# Patient Record
Sex: Female | Born: 1935
Health system: Southern US, Community
[De-identification: ages and names within clinical notes are randomized; demographics above are authoritative.]

## PROBLEM LIST (undated history)

## (undated) DIAGNOSIS — C801 Malignant (primary) neoplasm, unspecified: Secondary | ICD-10-CM

## (undated) DIAGNOSIS — Z972 Presence of dental prosthetic device (complete) (partial): Secondary | ICD-10-CM

## (undated) DIAGNOSIS — E042 Nontoxic multinodular goiter: Secondary | ICD-10-CM

## (undated) DIAGNOSIS — E041 Nontoxic single thyroid nodule: Secondary | ICD-10-CM

## (undated) DIAGNOSIS — H409 Unspecified glaucoma: Secondary | ICD-10-CM

## (undated) DIAGNOSIS — R319 Hematuria, unspecified: Principal | ICD-10-CM

## (undated) DIAGNOSIS — I10 Essential (primary) hypertension: Secondary | ICD-10-CM

## (undated) DIAGNOSIS — Z923 Personal history of irradiation: Secondary | ICD-10-CM

## (undated) DIAGNOSIS — Z973 Presence of spectacles and contact lenses: Secondary | ICD-10-CM

## (undated) DIAGNOSIS — I700 Atherosclerosis of aorta: Secondary | ICD-10-CM

## (undated) DIAGNOSIS — B269 Mumps without complication: Secondary | ICD-10-CM

## (undated) DIAGNOSIS — Z0000 Encounter for general adult medical examination without abnormal findings: Secondary | ICD-10-CM

## (undated) DIAGNOSIS — R17 Unspecified jaundice: Secondary | ICD-10-CM

## (undated) DIAGNOSIS — E119 Type 2 diabetes mellitus without complications: Secondary | ICD-10-CM

## (undated) DIAGNOSIS — M1990 Unspecified osteoarthritis, unspecified site: Secondary | ICD-10-CM

## (undated) DIAGNOSIS — B059 Measles without complication: Secondary | ICD-10-CM

## (undated) DIAGNOSIS — E785 Hyperlipidemia, unspecified: Secondary | ICD-10-CM

## (undated) HISTORY — DX: Encounter for general adult medical examination without abnormal findings: Z00.00

## (undated) HISTORY — PX: MULTIPLE TOOTH EXTRACTIONS: SHX2053

## (undated) HISTORY — PX: KNEE SURGERY: SHX244

## (undated) HISTORY — PX: TONSILLECTOMY: SHX5217

## (undated) HISTORY — DX: Essential (primary) hypertension: I10

## (undated) HISTORY — DX: Hematuria, unspecified: R31.9

## (undated) HISTORY — DX: Hyperlipidemia, unspecified: E78.5

## (undated) HISTORY — DX: Measles without complication: B05.9

## (undated) HISTORY — DX: Mumps without complication: B26.9

## (undated) HISTORY — PX: EYE SURGERY: SHX253

## (undated) HISTORY — DX: Type 2 diabetes mellitus without complications: E11.9

---
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
## 2005-09-28 LAB — HM COLONOSCOPY: HM Colonoscopy: NORMAL

## 2006-09-28 DIAGNOSIS — E119 Type 2 diabetes mellitus without complications: Secondary | ICD-10-CM

## 2006-09-28 HISTORY — DX: Type 2 diabetes mellitus without complications: E11.9

## 2008-09-28 LAB — HM PAP SMEAR: HM Pap smear: NORMAL

## 2010-09-28 HISTORY — PX: EYE SURGERY: SHX253

## 2013-03-28 LAB — HM DEXA SCAN

## 2013-07-31 ENCOUNTER — Ambulatory Visit (INDEPENDENT_AMBULATORY_CARE_PROVIDER_SITE_OTHER): Payer: Medicare FFS | Admitting: Family Medicine

## 2013-07-31 ENCOUNTER — Encounter: Payer: Self-pay | Admitting: Family Medicine

## 2013-07-31 VITALS — BP 148/68 | HR 78 | Temp 97.7°F | Ht 61.0 in | Wt 141.1 lb

## 2013-07-31 DIAGNOSIS — M1990 Unspecified osteoarthritis, unspecified site: Secondary | ICD-10-CM | POA: Insufficient documentation

## 2013-07-31 DIAGNOSIS — E1122 Type 2 diabetes mellitus with diabetic chronic kidney disease: Secondary | ICD-10-CM | POA: Insufficient documentation

## 2013-07-31 DIAGNOSIS — N1831 Chronic kidney disease, stage 3a: Secondary | ICD-10-CM | POA: Insufficient documentation

## 2013-07-31 DIAGNOSIS — E119 Type 2 diabetes mellitus without complications: Secondary | ICD-10-CM

## 2013-07-31 DIAGNOSIS — E785 Hyperlipidemia, unspecified: Secondary | ICD-10-CM

## 2013-07-31 DIAGNOSIS — Z23 Encounter for immunization: Secondary | ICD-10-CM

## 2013-07-31 DIAGNOSIS — I10 Essential (primary) hypertension: Secondary | ICD-10-CM

## 2013-07-31 DIAGNOSIS — Z0000 Encounter for general adult medical examination without abnormal findings: Secondary | ICD-10-CM

## 2013-07-31 HISTORY — DX: Hyperlipidemia, unspecified: E78.5

## 2013-07-31 MED ORDER — AMLODIPINE BESYLATE 5 MG PO TABS: 5.0000 mg | ORAL_TABLET | Freq: Every day | ORAL | Status: DC

## 2013-07-31 MED ORDER — MICROLET LANCETS MISC: Status: DC

## 2013-07-31 MED ORDER — METFORMIN HCL 500 MG PO TABS: 500.0000 mg | ORAL_TABLET | Freq: Two times a day (BID) | ORAL | Status: DC

## 2013-07-31 MED ORDER — GLUCOSE BLOOD VI STRP: ORAL_STRIP | Status: DC

## 2013-07-31 NOTE — Patient Instructions (Addendum)
DASH Diet  The DASH diet stands for "Dietary Approaches to Stop Hypertension." It is a healthy eating plan that has been shown to reduce high blood pressure (hypertension) in as little as 14 days, while also possibly providing other significant health benefits. These other health benefits include reducing the risk of breast cancer after menopause and reducing the risk of type 2 diabetes, heart disease, colon cancer, and stroke. Health benefits also include weight loss and slowing kidney failure in patients with chronic kidney disease.   DIET GUIDELINES  · Limit salt (sodium). Your diet should contain less than 1500 mg of sodium daily.  · Limit refined or processed carbohydrates. Your diet should include mostly whole grains. Desserts and added sugars should be used sparingly.  · Include small amounts of heart-healthy fats. These types of fats include nuts, oils, and tub margarine. Limit saturated and trans fats. These fats have been shown to be harmful in the body.  CHOOSING FOODS   The following food groups are based on a 2000 calorie diet. See your Registered Dietitian for individual calorie needs.  Grains and Grain Products (6 to 8 servings daily)  · Eat More Often: Whole-wheat bread, brown rice, whole-grain or wheat pasta, quinoa, popcorn without added fat or salt (air popped).  · Eat Less Often: White bread, white pasta, white rice, cornbread.  Vegetables (4 to 5 servings daily)  · Eat More Often: Fresh, frozen, and canned vegetables. Vegetables may be raw, steamed, roasted, or grilled with a minimal amount of fat.  · Eat Less Often/Avoid: Creamed or fried vegetables. Vegetables in a cheese sauce.  Fruit (4 to 5 servings daily)  · Eat More Often: All fresh, canned (in natural juice), or frozen fruits. Dried fruits without added sugar. One hundred percent fruit juice (½ cup [237 mL] daily).  · Eat Less Often: Dried fruits with added sugar. Canned fruit in light or heavy syrup.  Lean Meats, Fish, and Poultry (2  servings or less daily. One serving is 3 to 4 oz [85-114 g]).  · Eat More Often: Ninety percent or leaner ground beef, tenderloin, sirloin. Round cuts of beef, chicken breast, turkey breast. All fish. Grill, bake, or broil your meat. Nothing should be fried.  · Eat Less Often/Avoid: Fatty cuts of meat, turkey, or chicken leg, thigh, or wing. Fried cuts of meat or fish.  Dairy (2 to 3 servings)  · Eat More Often: Low-fat or fat-free milk, low-fat plain or light yogurt, reduced-fat or part-skim cheese.  · Eat Less Often/Avoid: Milk (whole, 2%). Whole milk yogurt. Full-fat cheeses.  Nuts, Seeds, and Legumes (4 to 5 servings per week)  · Eat More Often: All without added salt.  · Eat Less Often/Avoid: Salted nuts and seeds, canned beans with added salt.  Fats and Sweets (limited)  · Eat More Often: Vegetable oils, tub margarines without trans fats, sugar-free gelatin. Mayonnaise and salad dressings.  · Eat Less Often/Avoid: Coconut oils, palm oils, butter, stick margarine, cream, half and half, cookies, candy, pie.  FOR MORE INFORMATION  The Dash Diet Eating Plan: www.dashdiet.org  Document Released: 09/03/2011 Document Revised: 12/07/2011 Document Reviewed: 09/03/2011  ExitCare® Patient Information ©2014 [employer].

## 2013-08-02 ENCOUNTER — Encounter: Payer: Self-pay | Admitting: Family Medicine

## 2013-08-02 DIAGNOSIS — Z0000 Encounter for general adult medical examination without abnormal findings: Secondary | ICD-10-CM

## 2013-08-02 HISTORY — DX: Encounter for general adult medical examination without abnormal findings: Z00.00

## 2013-08-02 NOTE — Progress Notes (Signed)
Patient ID: Kristen Hill, female   DOB: 09/27/1926, 79 y.o.   MRN: [id number] Kristen Hill [id number] 1925-11-01 08/02/2013      Progress Note-Follow Up  Subjective  Chief Complaint  Chief Complaint  Patient presents with  . Establish Care    new patient  . Injections    flu    HPI  Patient is an 12 Caucasian female who is in today to establish care here. No recent illness. Has been following also with her ophthalmologist due to some increased pressure in her eyes and visual changes. So far she has not been given the diagnosis of glaucoma. She denies any fevers or chills. No headaches, chest pain, palpitations, shortness of breath, GI or GU concerns  Past Medical History  Diagnosis Date  . Diabetes mellitus without complication 2008    type 2  . Hypertension   . Mumps as a child  . Measles as a child  . Arthritis     right knee, torn meniscus lateral and medial  . Other and unspecified hyperlipidemia 07/31/2013  . Physical exam, annual 08/02/2013    Sees Dr Sondra Barges at [facility] Dr Carmon Ginsberg, Retinal specialist Dermatolgy    Past Surgical History  Procedure Laterality Date  . Tonsillectomy  79 yrs old  . Eye surgery  2009 and 2010    cataract both eyes  . Eye surgery  2012    laser to left eye, chalazion was removed with laser  . Knee surgery Right     medial meniscus and lateral menicus    Family History  Problem Relation Age of Onset  . Cancer Mother     ovarian  . Diabetes Father   . Kidney disease Father     History   Social History  . Marital Status: Widowed    Spouse Name: N/A    Number of Children: N/A  . Years of Education: N/A   Occupational History  . Not on file.   Social History Main Topics  . Smoking status: Former Smoker -- 0.10 packs/day for 1 years    Types: Cigarettes    Quit date: 09/28/1950  . Smokeless tobacco: Never Used  . Alcohol Use: No  . Drug Use: No  . Sexual Activity: No     Comment: lives with  daughter, no dietary restrictions   Other Topics Concern  . Not on file   Social History Narrative  . No narrative on file    No current outpatient prescriptions on file prior to visit.   No current facility-administered medications on file prior to visit.    Allergies  Allergen Reactions  . Bee Venom   . Sulfa Antibiotics Rash    Review of Systems  Review of Systems  Constitutional: Negative for fever and malaise/fatigue.  HENT: Negative for congestion.   Eyes: Negative for discharge.  Respiratory: Negative for shortness of breath.   Cardiovascular: Negative for chest pain, palpitations and leg swelling.  Gastrointestinal: Negative for nausea, abdominal pain and diarrhea.  Genitourinary: Negative for dysuria.  Musculoskeletal: Negative for falls.  Skin: Positive for rash.  Neurological: Negative for loss of consciousness and headaches.  Endo/Heme/Allergies: Negative for polydipsia.  Psychiatric/Behavioral: Negative for depression and suicidal ideas. The patient is not nervous/anxious and does not have insomnia.     Objective  BP 148/68  Pulse 78  Temp(Src) 97.7 F (36.5 C) (Oral)  Ht 5\' 1"  (1.549 m)  Wt 141 lb 1.3 oz (63.993 kg)  BMI 26.67 kg/m2  SpO2 96%  Physical Exam  Physical Exam  Constitutional: She is oriented to person, place, and time and well-developed, well-nourished, and in no distress. No distress.  HENT:  Head: Normocephalic and atraumatic.  Eyes: Conjunctivae are normal.  Neck: Neck supple. No thyromegaly present.  Cardiovascular: Normal rate, regular rhythm and normal heart sounds.   Pulmonary/Chest: Effort normal and breath sounds normal. She has no wheezes.  Abdominal: She exhibits no distension and no mass.  Musculoskeletal: She exhibits no edema.  Lymphadenopathy:    She has no cervical adenopathy.  Neurological: She is alert and oriented to person, place, and time.  Skin: Skin is warm and dry. No rash noted. She is not diaphoretic.   Psychiatric: Memory, affect and judgment normal.     Assessment & Plan  Hypertension Mild elevation, minimize sodium, patient reports good numbers at home.   Diabetes mellitus without complication Patient reports good control, minimize simple carbs and monitor  Physical exam, annual Flu shot today  Other and unspecified hyperlipidemia Avoid trans fats, check lipid panel and request old records

## 2013-08-02 NOTE — Assessment & Plan Note (Signed)
Avoid trans fats, check lipid panel and request old records

## 2013-08-02 NOTE — Assessment & Plan Note (Signed)
Mild elevation, minimize sodium, patient reports good numbers at home.

## 2013-08-02 NOTE — Assessment & Plan Note (Signed)
Flu shot today 

## 2013-08-02 NOTE — Assessment & Plan Note (Signed)
Patient reports good control, minimize simple carbs and monitor

## 2013-08-28 ENCOUNTER — Ambulatory Visit: Payer: Medicare FFS | Admitting: Family

## 2013-08-28 ENCOUNTER — Telehealth: Payer: Self-pay

## 2013-08-28 DIAGNOSIS — I10 Essential (primary) hypertension: Secondary | ICD-10-CM

## 2013-08-28 DIAGNOSIS — E119 Type 2 diabetes mellitus without complications: Secondary | ICD-10-CM

## 2013-08-28 MED ORDER — MICROLET LANCETS MISC: Status: DC

## 2013-08-28 MED ORDER — AMLODIPINE BESYLATE 5 MG PO TABS: 5.0000 mg | ORAL_TABLET | Freq: Every day | ORAL | Status: DC

## 2013-08-28 MED ORDER — GLUCOSE BLOOD VI STRP: ORAL_STRIP | Status: AC

## 2013-08-28 MED ORDER — METFORMIN HCL 500 MG PO TABS: 500.0000 mg | ORAL_TABLET | Freq: Two times a day (BID) | ORAL | Status: DC

## 2013-08-28 NOTE — Telephone Encounter (Signed)
Pt left a message stating that her medication is not at the right pharmacy?  I called pt back and she states that [facility] on [address] doesn't have her RX's?  Prescriptions were sent to [facility] in [place]?  I informed pt I would resend them to correct pharmacy

## 2013-10-25 ENCOUNTER — Telehealth: Payer: Self-pay | Admitting: *Deleted

## 2013-10-25 DIAGNOSIS — E119 Type 2 diabetes mellitus without complications: Secondary | ICD-10-CM

## 2013-10-25 DIAGNOSIS — E785 Hyperlipidemia, unspecified: Secondary | ICD-10-CM

## 2013-10-25 DIAGNOSIS — I10 Essential (primary) hypertension: Secondary | ICD-10-CM

## 2013-10-25 LAB — RENAL FUNCTION PANEL
Albumin: 4 g/dL (ref 3.5–5.2)
BUN: 19 mg/dL (ref 6–23)
CHLORIDE: 100 meq/L (ref 96–112)
CO2: 25 mEq/L (ref 19–32)
CREATININE: 1.04 mg/dL (ref 0.50–1.10)
Calcium: 9.3 mg/dL (ref 8.4–10.5)
Glucose, Bld: 106 mg/dL — ABNORMAL HIGH (ref 70–99)
PHOSPHORUS: 4.2 mg/dL (ref 2.3–4.6)
Potassium: 4.6 mEq/L (ref 3.5–5.3)
Sodium: 135 mEq/L (ref 135–145)

## 2013-10-25 LAB — HEPATIC FUNCTION PANEL
ALT: 14 U/L (ref 0–35)
AST: 18 U/L (ref 0–37)
Albumin: 4 g/dL (ref 3.5–5.2)
Alkaline Phosphatase: 60 U/L (ref 39–117)
BILIRUBIN DIRECT: 0.1 mg/dL (ref 0.0–0.3)
BILIRUBIN INDIRECT: 0.7 mg/dL (ref 0.2–1.2)
Total Bilirubin: 0.8 mg/dL (ref 0.2–1.2)
Total Protein: 7.4 g/dL (ref 6.0–8.3)

## 2013-10-25 LAB — LIPID PANEL
Cholesterol: 179 mg/dL (ref 0–200)
HDL: 60 mg/dL (ref >39)
LDL CALC: 106 mg/dL — AB (ref 0–99)
TRIGLYCERIDES: 63 mg/dL (ref <150)
Total CHOL/HDL Ratio: 3 Ratio
VLDL: 13 mg/dL (ref 0–40)

## 2013-10-25 LAB — CBC
HEMATOCRIT: 36.2 % (ref 36.0–46.0)
HEMOGLOBIN: 12.1 g/dL (ref 12.0–15.0)
MCH: 30.1 pg (ref 26.0–34.0)
MCHC: 33.4 g/dL (ref 30.0–36.0)
MCV: 90 fL (ref 78.0–100.0)
PLATELETS: 248 10*3/uL (ref 150–400)
RBC: 4.02 MIL/uL (ref 3.87–5.11)
RDW: 13.9 % (ref 11.5–15.5)
WBC: 5.1 10*3/uL (ref 4.0–10.5)

## 2013-10-25 LAB — HEMOGLOBIN A1C
Hgb A1c MFr Bld: 6.1 % — ABNORMAL HIGH (ref <5.7)
Mean Plasma Glucose: 128 mg/dL — ABNORMAL HIGH (ref <117)

## 2013-10-25 LAB — TSH: TSH: 2.022 u[IU]/mL (ref 0.350–4.500)

## 2013-10-25 NOTE — Telephone Encounter (Signed)
Pt presented to the lab. Orders entered per 07/2013 office note as below:  Labs prior to visit lipid, renal, cbc, tsh, hepatic, hgba1c, vitamin d

## 2013-10-26 LAB — VITAMIN D 25 HYDROXY (VIT D DEFICIENCY, FRACTURES): Vit D, 25-Hydroxy: 40 ng/mL (ref 30–89)

## 2013-10-31 ENCOUNTER — Encounter: Payer: Self-pay | Admitting: Family Medicine

## 2013-10-31 ENCOUNTER — Ambulatory Visit (INDEPENDENT_AMBULATORY_CARE_PROVIDER_SITE_OTHER): Payer: Medicare FFS | Admitting: Family Medicine

## 2013-10-31 VITALS — BP 140/72 | HR 84 | Temp 98.0°F | Ht 61.0 in | Wt 146.1 lb

## 2013-10-31 DIAGNOSIS — R319 Hematuria, unspecified: Secondary | ICD-10-CM

## 2013-10-31 HISTORY — DX: Hematuria, unspecified: R31.9

## 2013-10-31 NOTE — Patient Instructions (Signed)
No partially hydrogenated or trans fats, can be found in breads, crackers, peanut butter, creamy, buttery, stuff Minimize  Saturated fats (animal fats) ie butter, meat, cream chees in moderation Minimize white carbs or simple carbs Consider switching from fish oil to krill oil caps only need 1 krill to replace up to 4 fish oil  Cholesterol Cholesterol is a white, waxy, fat-like protein needed by your body in small amounts. The liver makes all the cholesterol you need. It is carried from the liver by the blood through the blood vessels. Deposits (plaque) may build up on blood vessel walls. This makes the arteries narrower and stiffer. Plaque increases the risk for heart attack and stroke. You cannot feel your cholesterol level even if it is very high. The only way to know is by a blood test to check your lipid (fats) levels. Once you know your cholesterol levels, you should keep a record of the test results. Work with your caregiver to to keep your levels in the desired range. WHAT THE RESULTS MEAN:  Total cholesterol is a rough measure of all the cholesterol in your blood.  LDL is the so-called bad cholesterol. This is the type that deposits cholesterol in the walls of the arteries. You want this level to be low.  HDL is the good cholesterol because it cleans the arteries and carries the LDL away. You want this level to be high.  Triglycerides are fat that the body can either burn for energy or store. High levels are closely linked to heart disease. DESIRED LEVELS:  Total cholesterol below 200.  LDL below 100 for people at risk, below 70 for very high risk.  HDL above 50 is good, above 60 is best.  Triglycerides below 150. HOW TO LOWER YOUR CHOLESTEROL:  Diet.  Choose fish or white meat chicken and [place], roasted or baked. Limit fatty cuts of red meat, fried foods, and processed meats, such as sausage and lunch meat.  Eat lots of fresh fruits and vegetables. Choose whole grains,  beans, pasta, potatoes and cereals.  Use only small amounts of olive, corn or canola oils. Avoid butter, mayonnaise, shortening or palm kernel oils. Avoid foods with trans-fats.  Use skim/nonfat milk and low-fat/nonfat yogurt and cheeses. Avoid whole milk, cream, ice cream, egg yolks and cheeses. Healthy desserts include angel food cake, ginger snaps, animal crackers, hard candy, popsicles, and low-fat/nonfat frozen yogurt. Avoid pastries, cakes, pies and cookies.  Exercise.  A regular program helps decrease LDL and raises HDL.  Helps with weight control.  Do things that increase your activity level like gardening, walking, or taking the stairs.  Medication.  May be prescribed by your caregiver to help lowering cholesterol and the risk for heart disease.  You may need medicine even if your levels are normal if you have several risk factors. HOME CARE INSTRUCTIONS   Follow your diet and exercise programs as suggested by your caregiver.  Take medications as directed.  Have blood work done when your caregiver feels it is necessary. MAKE SURE YOU:   Understand these instructions.  Will watch your condition.  Will get help right away if you are not doing well or get worse. Document Released: 06/09/2001 Document Revised: 12/07/2011 Document Reviewed: 06/28/2013 [facility] Patient Information 2014 [place], [place].

## 2013-10-31 NOTE — Progress Notes (Signed)
Pre visit review using our clinic review tool, if applicable. No additional management support is needed unless otherwise documented below in the visit note. 

## 2013-10-31 NOTE — Progress Notes (Signed)
Subjective:     Patient ID: Kristen Hill, female   DOB: [birth year], 80 y.o.   MRN: [id number]   HPI  Pt is here for a 3 mo f/u HTN and Diabetes. She reports blood pressure readings at home most commonly in 962X systolic and high 52W diastolic. Yesterday her BP was 136/78 per home recording. [facility] [employer] [facility] [employer] [facility], walking on the treadmill and the stationary bicycle 3x per week. She checks her blood sugar in the morning before breakfast with common numbers 95-105. She sees an opthalmologist 2x year. She denies burning sensations in her feet and denies falls. She would like her feet examined today for her diabetic exam.   Pt states she would like her ears to be examined and cleaned if appropriate. Gardenia Phlegm, RMA assisted me with cleaning pt's ears after examination.   Review of Systems  Respiratory: Negative for shortness of breath.   Cardiovascular: Negative for chest pain and palpitations.  Neurological: Positive for light-headedness. Negative for numbness and headaches.       Occassional lightheadedness in mornings   Patient Active Problem List   Diagnosis Date Noted  . Physical exam, annual 08/02/2013  . Other and unspecified hyperlipidemia 07/31/2013  . Diabetes mellitus without complication   . Hypertension   . Arthritis    Family History  Problem Relation Age of Onset  . Cancer Mother     ovarian  . Diabetes Father   . Kidney disease Father    History   Social History  . Marital Status: Widowed    Spouse Name: N/A    Number of Children: N/A  . Years of Education: N/A   Social History Main Topics  . Smoking status: Former Smoker -- 0.10 packs/day for 1 years    Types: Cigarettes    Quit date: 09/28/1950  . Smokeless tobacco: Never Used  . Alcohol Use: No  . Drug Use: No  . Sexual Activity: No     Comment: lives with daughter, no dietary restrictions   Other Topics Concern  . None   Social  History Narrative  . None   Current Outpatient Prescriptions on File Prior to Visit  Medication Sig Dispense Refill  . amLODipine (NORVASC) 5 MG tablet Take 1 tablet (5 mg total) by mouth daily.  90 tablet  1  . Calcium-Magnesium-Vitamin D [id number] MG-MG-UNIT TB24 Take by mouth. Calcium 1200 mg plus 1000 IU vitamin D3- 1 daily      . cholecalciferol (VITAMIN D) 1000 UNITS tablet Take 5,000 Units by mouth daily.      . fish oil-omega-3 fatty acids 1000 MG capsule Take 2 g by mouth daily.      Marland Kitchen glucose blood (BAYER CONTOUR TEST) test strip Check blood sugars q am and prn  100 each  5  . metFORMIN (GLUCOPHAGE) 500 MG tablet Take 1 tablet (500 mg total) by mouth 2 (two) times daily with a meal.  180 tablet  1  . MICROLET LANCETS MISC Check BS q am and prn  100 each  5   No current facility-administered medications on file prior to visit.   Allergies  Allergen Reactions  . Bee Venom   . Sulfa Antibiotics Rash      Objective:   Physical Exam  Constitutional: She is oriented to person, place, and time. She appears well-developed and well-nourished. No distress.  HENT:  Head: Normocephalic and atraumatic.  Right Ear: Hearing, tympanic membrane, external ear  and ear canal normal. No tenderness.  Left Ear: Hearing, tympanic membrane, external ear and ear canal normal. No tenderness.  Mouth/Throat: Uvula is midline and oropharynx is clear and moist.  Ears were filled with cerumen bilaterally before cleaning process with Peroxide and warm water.   Eyes: Lids are normal. Right eye exhibits no discharge. Left eye exhibits no discharge.  Wears eyeglasses.   Cardiovascular: Normal rate, regular rhythm and normal heart sounds.   Pulmonary/Chest: Effort normal and breath sounds normal. No respiratory distress.  Lymphadenopathy:       Head (right side): No submental, no submandibular, no tonsillar, no preauricular and no posterior auricular adenopathy present.       Head (left side): No  submental, no submandibular, no tonsillar, no preauricular and no posterior auricular adenopathy present.    She has no cervical adenopathy.  Neurological: She is alert and oriented to person, place, and time. No sensory deficit.  Normal sensation to dorsal and ventral aspects both feet.   Skin: Skin is warm and dry. No rash noted. No erythema.  Psychiatric: She has a normal mood and affect.   Diabetic foot exam performed by Dr. Charlett Blake.   Wt Readings from Last 3 Encounters:  10/31/13 146 lb 1.3 oz (66.261 kg)  07/31/13 141 lb 1.3 oz (63.993 kg)   Temp Readings from Last 3 Encounters:  10/31/13 98 F (36.7 C) Oral  07/31/13 97.7 F (36.5 C) Oral   BP Readings from Last 3 Encounters:  10/31/13 140/72  07/31/13 148/68   Pulse Readings from Last 3 Encounters:  10/31/13 84  07/31/13 78   Her BP today on recheck was 136/72 Left arm.     Assessment:      1. Diabetes, controlled. 2. HTN 3. H/o hematuria 4. Elevated LDL     Plan:      1. Diabetes, continue current medications. Pt was asked to have opthalmology paperwork sent to Dr. Frederik Pear office. F/u in 6 months for Diabetics & HTN management and monitoring.  2. HTN, continue current medication regimen.  3. H/o hematuria, per pt - Pt will have UA with microscopy at next visit. 4. Elevated LDL - Pt educated about statins, fish/krill oil, and trans/saturated fats. She was encouraged to continue current exercise regimen and consider dietary adjustments.     02.03.2015  Martinique Hausladen, PA-S.     Patient seen, inteviewed and examined with student agree with documentation

## 2013-11-01 ENCOUNTER — Encounter: Payer: Self-pay | Admitting: Family Medicine

## 2013-11-15 ENCOUNTER — Encounter: Payer: Self-pay | Admitting: Family Medicine

## 2013-11-15 ENCOUNTER — Telehealth: Payer: Self-pay | Admitting: Family Medicine

## 2013-11-15 NOTE — Telephone Encounter (Signed)
Gave her [employer] paperwork to [place] at her visit.  Is the paperwork ready

## 2013-11-15 NOTE — Telephone Encounter (Signed)
Patient informed that paperwork has to be signed by provider. And then we will fax and mail her a copy.   Patient voiced understanding

## 2013-11-21 ENCOUNTER — Telehealth: Payer: Self-pay

## 2013-11-21 DIAGNOSIS — E559 Vitamin D deficiency, unspecified: Secondary | ICD-10-CM

## 2013-11-21 NOTE — Telephone Encounter (Signed)
We are needing a diagnosis for insurance to pay for Vitamin D testing. We need to know if patient has ever had osteopenia or osteoporsis?  Left a detailed message on patients vm

## 2013-11-22 NOTE — Telephone Encounter (Signed)
OK so resubmit her labs with code for vitamin d deficiency and list vitamin d deficiency in her problem list please

## 2013-11-22 NOTE — Telephone Encounter (Signed)
She states the reason she was taking Vit D was due to lab work

## 2013-11-27 DIAGNOSIS — E559 Vitamin D deficiency, unspecified: Secondary | ICD-10-CM | POA: Insufficient documentation

## 2013-11-27 NOTE — Telephone Encounter (Signed)
Vitamin d deficiency was added to patients problem list

## 2013-11-27 NOTE — Telephone Encounter (Signed)
Patient left a message for me to return her call that she has a couple of questions

## 2013-11-28 NOTE — Telephone Encounter (Signed)
Pt states pneumonia was done in 2009. Date corrected. Pt states that mammogram was done on 7-7.  Pt states that hematuria was listed as a primary diagnosis. Pt would like to know why that is listed when MD has never checked her for this? Pt then proceeded to tell me that for the last 8 or 9 years there is always a trace of blood in her urine and she has been checked out by a lot of people and its nothing? I tried to explain that it was probably listed in the problem list since her and the doctor discussed this? The patient then stated for the last 8 years her primary diagnosis is hypertension.  Please advise?

## 2013-11-28 NOTE — Telephone Encounter (Signed)
Patient wanted to know why we wanted to know if she had osteopenia or osteoporosis.

## 2013-11-29 NOTE — Telephone Encounter (Signed)
So if she has the history she has the history. It is important to note the history is there so in the future everyone will know when someone checks her urine in the the ED at our office or elsewhere. If we do not list it then she will have to explain this all again. I agree her primary diagnosis is HTN. That is certainly the thing we will manage most moving forward.

## 2013-11-29 NOTE — Telephone Encounter (Signed)
Notified pt. She voices understanding.

## 2014-01-17 ENCOUNTER — Telehealth: Payer: Self-pay | Admitting: *Deleted

## 2014-01-17 NOTE — Telephone Encounter (Signed)
Received message from pt requesting return call regarding a "new prescription for a medication".

## 2014-01-18 NOTE — Telephone Encounter (Signed)
Pt was calling wandering why her test strips state to check in the am and pm. I informed patient that it doesn't read in the am and pm it states to check BS in qam and prn. Which is just as needed.  Pt voiced understanding

## 2014-03-13 ENCOUNTER — Other Ambulatory Visit: Payer: Self-pay | Admitting: Family Medicine

## 2014-03-19 ENCOUNTER — Other Ambulatory Visit: Payer: Self-pay | Admitting: Family Medicine

## 2014-03-19 ENCOUNTER — Other Ambulatory Visit: Payer: Self-pay

## 2014-03-19 DIAGNOSIS — D241 Benign neoplasm of right breast: Secondary | ICD-10-CM

## 2014-03-19 DIAGNOSIS — R921 Mammographic calcification found on diagnostic imaging of breast: Secondary | ICD-10-CM

## 2014-04-05 ENCOUNTER — Other Ambulatory Visit: Payer: Medicare FFS

## 2014-04-12 ENCOUNTER — Ambulatory Visit
Admission: RE | Admit: 2014-04-12 | Discharge: 2014-04-12 | Disposition: A | Discharge status: Home / Self Care | Payer: Medicare HMO | Source: Ambulatory Visit | Attending: Family Medicine | Admitting: Family Medicine

## 2014-04-12 ENCOUNTER — Encounter (INDEPENDENT_AMBULATORY_CARE_PROVIDER_SITE_OTHER): Payer: Self-pay

## 2014-04-12 DIAGNOSIS — D241 Benign neoplasm of right breast: Secondary | ICD-10-CM

## 2014-04-12 DIAGNOSIS — R921 Mammographic calcification found on diagnostic imaging of breast: Secondary | ICD-10-CM

## 2014-05-03 ENCOUNTER — Ambulatory Visit: Payer: Medicare FFS | Admitting: Family Medicine

## 2015-01-15 DIAGNOSIS — H40052 Ocular hypertension, left eye: Secondary | ICD-10-CM | POA: Diagnosis not present

## 2015-04-15 ENCOUNTER — Other Ambulatory Visit: Payer: Self-pay

## 2015-04-15 DIAGNOSIS — Z803 Family history of malignant neoplasm of breast: Secondary | ICD-10-CM

## 2015-04-15 DIAGNOSIS — Z1231 Encounter for screening mammogram for malignant neoplasm of breast: Secondary | ICD-10-CM

## 2015-05-03 ENCOUNTER — Ambulatory Visit
Admission: RE | Admit: 2015-05-03 | Discharge: 2015-05-03 | Disposition: A | Discharge status: Home / Self Care | Payer: Medicare PPO | Source: Ambulatory Visit

## 2015-05-03 DIAGNOSIS — Z803 Family history of malignant neoplasm of breast: Secondary | ICD-10-CM

## 2015-05-03 DIAGNOSIS — Z1231 Encounter for screening mammogram for malignant neoplasm of breast: Secondary | ICD-10-CM

## 2015-05-06 ENCOUNTER — Other Ambulatory Visit: Payer: Self-pay | Admitting: Family Medicine

## 2015-05-06 DIAGNOSIS — R928 Other abnormal and inconclusive findings on diagnostic imaging of breast: Secondary | ICD-10-CM

## 2015-05-09 ENCOUNTER — Other Ambulatory Visit: Payer: Self-pay | Admitting: Family Medicine

## 2015-05-09 ENCOUNTER — Ambulatory Visit
Admission: RE | Admit: 2015-05-09 | Discharge: 2015-05-09 | Disposition: A | Discharge status: Home / Self Care | Payer: Medicare PPO | Source: Ambulatory Visit | Attending: Family Medicine | Admitting: Family Medicine

## 2015-05-09 DIAGNOSIS — R921 Mammographic calcification found on diagnostic imaging of breast: Secondary | ICD-10-CM | POA: Diagnosis not present

## 2015-05-09 DIAGNOSIS — R928 Other abnormal and inconclusive findings on diagnostic imaging of breast: Secondary | ICD-10-CM

## 2015-06-10 ENCOUNTER — Encounter (HOSPITAL_BASED_OUTPATIENT_CLINIC_OR_DEPARTMENT_OTHER): Payer: Self-pay | Admitting: Emergency Medicine

## 2015-06-10 ENCOUNTER — Emergency Department (HOSPITAL_BASED_OUTPATIENT_CLINIC_OR_DEPARTMENT_OTHER): Payer: Medicare PPO

## 2015-06-10 ENCOUNTER — Emergency Department (HOSPITAL_BASED_OUTPATIENT_CLINIC_OR_DEPARTMENT_OTHER)
Admission: EM | Admit: 2015-06-10 | Discharge: 2015-06-10 | Disposition: A | Discharge status: Home / Self Care | Payer: Medicare PPO | Attending: Emergency Medicine | Admitting: Emergency Medicine

## 2015-06-10 DIAGNOSIS — Y9301 Activity, walking, marching and hiking: Secondary | ICD-10-CM | POA: Diagnosis not present

## 2015-06-10 DIAGNOSIS — S8991XA Unspecified injury of right lower leg, initial encounter: Secondary | ICD-10-CM | POA: Diagnosis not present

## 2015-06-10 DIAGNOSIS — Z79899 Other long term (current) drug therapy: Secondary | ICD-10-CM | POA: Diagnosis not present

## 2015-06-10 DIAGNOSIS — E119 Type 2 diabetes mellitus without complications: Secondary | ICD-10-CM | POA: Insufficient documentation

## 2015-06-10 DIAGNOSIS — M542 Cervicalgia: Secondary | ICD-10-CM | POA: Diagnosis not present

## 2015-06-10 DIAGNOSIS — S8002XA Contusion of left knee, initial encounter: Secondary | ICD-10-CM | POA: Diagnosis not present

## 2015-06-10 DIAGNOSIS — S0990XA Unspecified injury of head, initial encounter: Secondary | ICD-10-CM | POA: Insufficient documentation

## 2015-06-10 DIAGNOSIS — W01198A Fall on same level from slipping, tripping and stumbling with subsequent striking against other object, initial encounter: Secondary | ICD-10-CM | POA: Insufficient documentation

## 2015-06-10 DIAGNOSIS — S00531A Contusion of lip, initial encounter: Secondary | ICD-10-CM | POA: Diagnosis not present

## 2015-06-10 DIAGNOSIS — S199XXA Unspecified injury of neck, initial encounter: Secondary | ICD-10-CM | POA: Diagnosis not present

## 2015-06-10 DIAGNOSIS — S8992XA Unspecified injury of left lower leg, initial encounter: Secondary | ICD-10-CM | POA: Insufficient documentation

## 2015-06-10 DIAGNOSIS — M25561 Pain in right knee: Secondary | ICD-10-CM | POA: Diagnosis not present

## 2015-06-10 DIAGNOSIS — S01511A Laceration without foreign body of lip, initial encounter: Secondary | ICD-10-CM | POA: Diagnosis not present

## 2015-06-10 DIAGNOSIS — R220 Localized swelling, mass and lump, head: Secondary | ICD-10-CM | POA: Diagnosis not present

## 2015-06-10 DIAGNOSIS — Z8619 Personal history of other infectious and parasitic diseases: Secondary | ICD-10-CM | POA: Diagnosis not present

## 2015-06-10 DIAGNOSIS — S8001XA Contusion of right knee, initial encounter: Secondary | ICD-10-CM | POA: Diagnosis not present

## 2015-06-10 DIAGNOSIS — Z87891 Personal history of nicotine dependence: Secondary | ICD-10-CM | POA: Diagnosis not present

## 2015-06-10 DIAGNOSIS — Y998 Other external cause status: Secondary | ICD-10-CM | POA: Insufficient documentation

## 2015-06-10 DIAGNOSIS — I10 Essential (primary) hypertension: Secondary | ICD-10-CM | POA: Insufficient documentation

## 2015-06-10 DIAGNOSIS — T148 Other injury of unspecified body region: Secondary | ICD-10-CM | POA: Insufficient documentation

## 2015-06-10 DIAGNOSIS — Z8739 Personal history of other diseases of the musculoskeletal system and connective tissue: Secondary | ICD-10-CM | POA: Insufficient documentation

## 2015-06-10 DIAGNOSIS — M25562 Pain in left knee: Secondary | ICD-10-CM | POA: Diagnosis not present

## 2015-06-10 DIAGNOSIS — S0033XA Contusion of nose, initial encounter: Secondary | ICD-10-CM | POA: Diagnosis not present

## 2015-06-10 DIAGNOSIS — Y9289 Other specified places as the place of occurrence of the external cause: Secondary | ICD-10-CM | POA: Diagnosis not present

## 2015-06-10 DIAGNOSIS — T07XXXA Unspecified multiple injuries, initial encounter: Secondary | ICD-10-CM

## 2015-06-10 DIAGNOSIS — S0993XA Unspecified injury of face, initial encounter: Secondary | ICD-10-CM | POA: Diagnosis present

## 2015-06-10 MED ORDER — TRAMADOL HCL 50 MG PO TABS: 50.0000 mg | ORAL_TABLET | Freq: Four times a day (QID) | ORAL | Status: DC | PRN

## 2015-06-10 NOTE — ED Notes (Signed)
MD at bedside. 

## 2015-06-10 NOTE — ED Notes (Signed)
[address] [address] [facility]

## 2015-06-10 NOTE — Discharge Instructions (Signed)
Contusion °A contusion is a deep bruise. Contusions happen when an injury causes bleeding under the skin. Signs of bruising include pain, puffiness (swelling), and discolored skin. The contusion may turn blue, purple, or yellow. °HOME CARE  °· Put ice on the injured area. °¨ Put ice in a plastic bag. °¨ Place a towel between your skin and the bag. °¨ Leave the ice on for 15-20 minutes, 03-04 times a day. °· Only take medicine as told by your doctor. °· Rest the injured area. °· If possible, raise (elevate) the injured area to lessen puffiness. °GET HELP RIGHT AWAY IF:  °· You have more bruising or puffiness. °· You have pain that is getting worse. °· Your puffiness or pain is not helped by medicine. °MAKE SURE YOU:  °· Understand these instructions. °· Will watch your condition. °· Will get help right away if you are not doing well or get worse. °Document Released: 03/02/2008 Document Revised: 12/07/2011 Document Reviewed: 07/20/2011 °ExitCare® Patient Information ©2015 [employer]. This information is not intended to replace advice given to you by your health care provider. Make sure you discuss any questions you have with your health care provider. ° °

## 2015-06-10 NOTE — ED Notes (Addendum)
MD at bedside to remove c-collar

## 2015-06-10 NOTE — ED Notes (Signed)
Family at bedside. Returned from radiology

## 2015-06-10 NOTE — ED Notes (Signed)
Pt in radiology 

## 2015-06-10 NOTE — ED Provider Notes (Signed)
CSN: [id number]     Arrival date & time 06/10/15  1118 History   First MD Initiated Contact with Patient 06/10/15 1124     Chief Complaint  Patient presents with  . Fall      HPI  Patient presents for evaluation after a fall. Patient was walking into the [employer] to do her exercises. She caught her foot on a curb and fell forward. Landed on her knees. Was able to catch herself somewhat with her hands. Her face hit firmly against the concrete. No loss of consciousness. His bleeding from the mouth. Some tenderness over the nose and lips. Complains of pain in the face, head, and neck. Also bilateral knees. Was able to stand and walk with some assistance. Brought in by private conveyance.  Past Medical History  Diagnosis Date  . Diabetes mellitus without complication 4174    type 2  . Hypertension   . Mumps as a child  . Measles as a child  . Arthritis     right knee, torn meniscus lateral and medial  . Other and unspecified hyperlipidemia 07/31/2013  . Physical exam, annual 08/02/2013    Sees Dr Trinna Post at [facility] Dr Farris Has, Retinal specialist Dermatolgy  . Hematuria 10/31/2013   Past Surgical History  Procedure Laterality Date  . Tonsillectomy  81 yrs old  . Eye surgery  2009 and 2010    cataract both eyes  . Eye surgery  2012    laser to left eye, chalazion was removed with laser  . Knee surgery Right     medial meniscus and lateral menicus   Family History  Problem Relation Age of Onset  . Cancer Mother     ovarian  . Diabetes Father   . Kidney disease Father    Social History  Substance Use Topics  . Smoking status: Former Smoker -- 0.10 packs/day for 1 years    Types: Cigarettes    Quit date: 09/28/1950  . Smokeless tobacco: Never Used  . Alcohol Use: No   OB History    No data available     Review of Systems  Constitutional: Negative for fever, chills, diaphoresis, appetite change and fatigue.  HENT: Negative for mouth sores, sore throat and trouble  swallowing.        No dental trauma. Has a "cut" on her upper lip. No blood from the nose or ears. No vision changes.  Eyes: Negative for visual disturbance.  Respiratory: Negative for cough, chest tightness, shortness of breath and wheezing.   Cardiovascular: Negative for chest pain.  Gastrointestinal: Negative for nausea, vomiting, abdominal pain, diarrhea and abdominal distention.  Endocrine: Negative for polydipsia, polyphagia and polyuria.  Genitourinary: Negative for dysuria, frequency and hematuria.  Musculoskeletal: Positive for neck pain. Negative for gait problem.       Knee pain  Skin: Negative for color change, pallor and rash.  Neurological: Negative for dizziness, syncope, light-headedness and headaches.  Hematological: Does not bruise/bleed easily.  Psychiatric/Behavioral: Negative for behavioral problems and confusion.      Allergies  Bee venom and Sulfa antibiotics  Home Medications   Prior to Admission medications   Medication Sig Start Date End Date Taking? Authorizing Provider  amLODipine (NORVASC) 5 MG tablet Take 1 tablet (5 mg total) by mouth daily. 08/28/13   Mosie Lukes, MD  Calcium-Magnesium-Vitamin D [phone] MG-MG-UNIT TB24 Take by mouth. Calcium 1200 mg plus 1000 IU vitamin D3- 1 daily    Historical Provider, MD  cholecalciferol (VITAMIN D)  1000 UNITS tablet Take 5,000 Units by mouth daily.    Historical Provider, MD  glucose blood (BAYER CONTOUR TEST) test strip Check blood sugars q am and prn 08/28/13   Mosie Lukes, MD  metFORMIN (GLUCOPHAGE) 500 MG tablet Take 1 tablet (500 mg total) by mouth 2 (two) times daily with a meal. 08/28/13   Mosie Lukes, MD  MICROLET LANCETS MISC Check BS q am and prn 08/28/13   Mosie Lukes, MD  traMADol (ULTRAM) 50 MG tablet Take 1 tablet (50 mg total) by mouth every 6 (six) hours as needed. 06/10/15   Tanna Furry, MD   BP 134/70 mmHg  Pulse 75  Temp(Src) 97.5 F (36.4 C) (Oral)  Resp 18  Ht 5' (1.524 m)  Wt  140 lb (63.504 kg)  BMI 27.34 kg/m2  SpO2 99% Physical Exam  Constitutional: She is oriented to person, place, and time. She appears well-developed and well-nourished. No distress.  HENT:  Nose:    Mouth/Throat:    No blood over the TMs. No periorbital ecchymosis.  Eyes: Conjunctivae are normal. Pupils are equal, round, and reactive to light. No scleral icterus.  Neck: Normal range of motion. Neck supple. No thyromegaly present.  No midline neck tenderness.  Cardiovascular: Normal rate and regular rhythm.  Exam reveals no gallop and no friction rub.   No murmur heard. Pulmonary/Chest: Effort normal and breath sounds normal. No respiratory distress. She has no wheezes. She has no rales.  Abdominal: Soft. Bowel sounds are normal. She exhibits no distension. There is no tenderness. There is no rebound.  Musculoskeletal: Normal range of motion.  Neurological: She is alert and oriented to person, place, and time.  Skin: Skin is warm and dry. No rash noted.  Psychiatric: She has a normal mood and affect. Her behavior is normal.    ED Course  Procedures (including critical care time) Labs Review Labs Reviewed - No data to display  Imaging Review Ct Head Wo Contrast  06/10/2015   CLINICAL DATA:  Fall and landed on face. Hit nose and chin. Patient in collar.  EXAM: CT HEAD WITHOUT CONTRAST  CT MAXILLOFACIAL WITHOUT CONTRAST  CT CERVICAL SPINE WITHOUT CONTRAST  TECHNIQUE: Multidetector CT imaging of the head, cervical spine, and maxillofacial structures were performed using the standard protocol without intravenous contrast. Multiplanar CT image reconstructions of the cervical spine and maxillofacial structures were also generated.  COMPARISON:  None.  FINDINGS: CT HEAD FINDINGS  There is generalized brain atrophy with commensurate dilatation of the ventricles and sulci. Mild chronic small vessel ischemic changes noted within the deep periventricular white matter regions. There is no mass,  hemorrhage, edema, or other evidence of acute parenchymal abnormality. No extra-axial hemorrhage. No osseous fracture or dislocation seen.  CT MAXILLOFACIAL FINDINGS  There is soft tissue edema/ swelling overlying the anterior mandible. Perhaps mild soft tissue edema about the nose and overlying the maxilla as well.  Osseous structures about the orbits appear intact and well aligned bilaterally. No acute nasal bone fracture. A slight rightward deformity of the nasal bones is likely chronic. Incidental note also made of a leftward nasal septal deviation without associated fracture.  Lower frontal bones appear intact. Bilateral zygoma and pterygoid plates are intact. Walls of the maxillary sinuses and maxilla appear intact and well aligned throughout. No mandible fracture or displacement. Multiple missing teeth within the mandible. No convincing evidence of an acute tooth dislodgement seen.  CT CERVICAL SPINE FINDINGS  There are moderate degenerative changes  throughout the mid and lower cervical spine with associated disc space narrowings and osseous spurring, and associated disc bulges at the C3-4 through C6-7 levels causing mild to moderate central canal stenoses. Slight reversal of the normal cervical lordosis likely related to these underlying degenerative changes. No fracture line or displaced fracture fragment identified. Facet joints appear well aligned.  Atherosclerotic changes noted at each carotid bulb region. Hypodense mass within the left thyroid lobe measures approximately 2.3 x 1.8 cm. Scarring/fibrosis noted at each lung apex.  IMPRESSION: 1. No evidence of acute intracranial abnormality. No intracranial mass, hemorrhage, or edema. Atrophy and chronic small vessel ischemic changes as described above. 2. Soft tissue edema/swelling overlying the anterior mandible and maxilla. No underlying fracture. No acute facial bone fracture or dislocation seen. 3. Degenerative changes within the mid and lower  cervical spine, moderate in degree, as detailed above. 4. No acute fracture or subluxation within the cervical spine. 5. Left thyroid lobe mass measuring 2.3 x 1.8 cm. Recommend nonemergent thyroid ultrasound follow-up to ensure benignity. 6. Atherosclerotic calcifications at each carotid bulb region.   Electronically Signed   By: Franki Cabot M.D.   On: 06/10/2015 12:49   Ct Cervical Spine Wo Contrast  06/10/2015   CLINICAL DATA:  Fall and landed on face. Hit nose and chin. Patient in collar.  EXAM: CT HEAD WITHOUT CONTRAST  CT MAXILLOFACIAL WITHOUT CONTRAST  CT CERVICAL SPINE WITHOUT CONTRAST  TECHNIQUE: Multidetector CT imaging of the head, cervical spine, and maxillofacial structures were performed using the standard protocol without intravenous contrast. Multiplanar CT image reconstructions of the cervical spine and maxillofacial structures were also generated.  COMPARISON:  None.  FINDINGS: CT HEAD FINDINGS  There is generalized brain atrophy with commensurate dilatation of the ventricles and sulci. Mild chronic small vessel ischemic changes noted within the deep periventricular white matter regions. There is no mass, hemorrhage, edema, or other evidence of acute parenchymal abnormality. No extra-axial hemorrhage. No osseous fracture or dislocation seen.  CT MAXILLOFACIAL FINDINGS  There is soft tissue edema/ swelling overlying the anterior mandible. Perhaps mild soft tissue edema about the nose and overlying the maxilla as well.  Osseous structures about the orbits appear intact and well aligned bilaterally. No acute nasal bone fracture. A slight rightward deformity of the nasal bones is likely chronic. Incidental note also made of a leftward nasal septal deviation without associated fracture.  Lower frontal bones appear intact. Bilateral zygoma and pterygoid plates are intact. Walls of the maxillary sinuses and maxilla appear intact and well aligned throughout. No mandible fracture or displacement.  Multiple missing teeth within the mandible. No convincing evidence of an acute tooth dislodgement seen.  CT CERVICAL SPINE FINDINGS  There are moderate degenerative changes throughout the mid and lower cervical spine with associated disc space narrowings and osseous spurring, and associated disc bulges at the C3-4 through C6-7 levels causing mild to moderate central canal stenoses. Slight reversal of the normal cervical lordosis likely related to these underlying degenerative changes. No fracture line or displaced fracture fragment identified. Facet joints appear well aligned.  Atherosclerotic changes noted at each carotid bulb region. Hypodense mass within the left thyroid lobe measures approximately 2.3 x 1.8 cm. Scarring/fibrosis noted at each lung apex.  IMPRESSION: 1. No evidence of acute intracranial abnormality. No intracranial mass, hemorrhage, or edema. Atrophy and chronic small vessel ischemic changes as described above. 2. Soft tissue edema/swelling overlying the anterior mandible and maxilla. No underlying fracture. No acute facial bone fracture or dislocation  seen. 3. Degenerative changes within the mid and lower cervical spine, moderate in degree, as detailed above. 4. No acute fracture or subluxation within the cervical spine. 5. Left thyroid lobe mass measuring 2.3 x 1.8 cm. Recommend nonemergent thyroid ultrasound follow-up to ensure benignity. 6. Atherosclerotic calcifications at each carotid bulb region.   Electronically Signed   By: Franki Cabot M.D.   On: 06/10/2015 12:49   Dg Knee Complete 4 Views Left  06/10/2015   CLINICAL DATA:  Fall on knee yesterday with acute left knee pain. Initial encounter.  EXAM: LEFT KNEE - COMPLETE 4+ VIEW  COMPARISON:  None.  FINDINGS: There is no evidence of fracture, subluxation or dislocation.  There is no evidence of joint effusion.  Degenerative changes are identified, moderate in the patellofemoral compartment.  No focal bony lesions are present.   IMPRESSION: No evidence of acute abnormality  Degenerative changes as described   Electronically Signed   By: Margarette Canada M.D.   On: 06/10/2015 12:58   Dg Knee Complete 4 Views Right  06/10/2015   CLINICAL DATA:  Fall at the [facility]. Bilateral knee pain. Prior knee surgery in 2010.  EXAM: RIGHT KNEE - COMPLETE 4+ VIEW  COMPARISON:  None.  FINDINGS: Moderate articular space narrowing in the medial compartment with considerable tricompartmental spurring. Considerable spurring of the tibial spine. No fracture identified. No definite knee effusion.  IMPRESSION: 1. Osteoarthritis of the knee.  No acute findings.   Electronically Signed   By: Van Clines M.D.   On: 06/10/2015 12:49   Ct Maxillofacial Wo Cm  06/10/2015   CLINICAL DATA:  Fall and landed on face. Hit nose and chin. Patient in collar.  EXAM: CT HEAD WITHOUT CONTRAST  CT MAXILLOFACIAL WITHOUT CONTRAST  CT CERVICAL SPINE WITHOUT CONTRAST  TECHNIQUE: Multidetector CT imaging of the head, cervical spine, and maxillofacial structures were performed using the standard protocol without intravenous contrast. Multiplanar CT image reconstructions of the cervical spine and maxillofacial structures were also generated.  COMPARISON:  None.  FINDINGS: CT HEAD FINDINGS  There is generalized brain atrophy with commensurate dilatation of the ventricles and sulci. Mild chronic small vessel ischemic changes noted within the deep periventricular white matter regions. There is no mass, hemorrhage, edema, or other evidence of acute parenchymal abnormality. No extra-axial hemorrhage. No osseous fracture or dislocation seen.  CT MAXILLOFACIAL FINDINGS  There is soft tissue edema/ swelling overlying the anterior mandible. Perhaps mild soft tissue edema about the nose and overlying the maxilla as well.  Osseous structures about the orbits appear intact and well aligned bilaterally. No acute nasal bone fracture. A slight rightward deformity of the nasal bones is likely  chronic. Incidental note also made of a leftward nasal septal deviation without associated fracture.  Lower frontal bones appear intact. Bilateral zygoma and pterygoid plates are intact. Walls of the maxillary sinuses and maxilla appear intact and well aligned throughout. No mandible fracture or displacement. Multiple missing teeth within the mandible. No convincing evidence of an acute tooth dislodgement seen.  CT CERVICAL SPINE FINDINGS  There are moderate degenerative changes throughout the mid and lower cervical spine with associated disc space narrowings and osseous spurring, and associated disc bulges at the C3-4 through C6-7 levels causing mild to moderate central canal stenoses. Slight reversal of the normal cervical lordosis likely related to these underlying degenerative changes. No fracture line or displaced fracture fragment identified. Facet joints appear well aligned.  Atherosclerotic changes noted at each carotid bulb region. Hypodense mass  within the left thyroid lobe measures approximately 2.3 x 1.8 cm. Scarring/fibrosis noted at each lung apex.  IMPRESSION: 1. No evidence of acute intracranial abnormality. No intracranial mass, hemorrhage, or edema. Atrophy and chronic small vessel ischemic changes as described above. 2. Soft tissue edema/swelling overlying the anterior mandible and maxilla. No underlying fracture. No acute facial bone fracture or dislocation seen. 3. Degenerative changes within the mid and lower cervical spine, moderate in degree, as detailed above. 4. No acute fracture or subluxation within the cervical spine. 5. Left thyroid lobe mass measuring 2.3 x 1.8 cm. Recommend nonemergent thyroid ultrasound follow-up to ensure benignity. 6. Atherosclerotic calcifications at each carotid bulb region.   Electronically Signed   By: Franki Cabot M.D.   On: 06/10/2015 12:49   I have personally reviewed and evaluated these images and lab results as part of my medical decision-making.    EKG Interpretation None      MDM   Final diagnoses:  Multiple contusions    Results discussed with patient. She had been made previously aware of the thyroid nodule. I asked her to follow this up with her primary care doctor. Ice or over-the-counter medicines at home. Return as needed for significant pain. Rinse her mouth after eating to avoid any entrapment of food in the mucosal laceration. This was not repaired.    Tanna Furry, MD 06/10/15 1310

## 2015-07-18 DIAGNOSIS — H40052 Ocular hypertension, left eye: Secondary | ICD-10-CM | POA: Diagnosis not present

## 2015-07-18 DIAGNOSIS — H5203 Hypermetropia, bilateral: Secondary | ICD-10-CM | POA: Diagnosis not present

## 2015-08-16 DIAGNOSIS — E119 Type 2 diabetes mellitus without complications: Secondary | ICD-10-CM | POA: Diagnosis not present

## 2015-08-16 DIAGNOSIS — Z23 Encounter for immunization: Secondary | ICD-10-CM | POA: Diagnosis not present

## 2015-08-16 DIAGNOSIS — H6121 Impacted cerumen, right ear: Secondary | ICD-10-CM | POA: Diagnosis not present

## 2015-08-16 DIAGNOSIS — Z0000 Encounter for general adult medical examination without abnormal findings: Secondary | ICD-10-CM | POA: Diagnosis not present

## 2015-08-16 DIAGNOSIS — E042 Nontoxic multinodular goiter: Secondary | ICD-10-CM | POA: Diagnosis not present

## 2015-09-29 HISTORY — PX: COLONOSCOPY: SHX174

## 2015-10-16 DIAGNOSIS — E042 Nontoxic multinodular goiter: Secondary | ICD-10-CM | POA: Diagnosis not present

## 2016-05-05 ENCOUNTER — Other Ambulatory Visit: Payer: Self-pay | Admitting: Family Medicine

## 2016-05-05 DIAGNOSIS — Z1231 Encounter for screening mammogram for malignant neoplasm of breast: Secondary | ICD-10-CM

## 2016-05-19 ENCOUNTER — Ambulatory Visit
Admission: RE | Admit: 2016-05-19 | Discharge: 2016-05-19 | Disposition: A | Discharge status: Home / Self Care | Payer: Medicare PPO | Source: Ambulatory Visit | Attending: Family Medicine | Admitting: Family Medicine

## 2016-05-19 DIAGNOSIS — Z1231 Encounter for screening mammogram for malignant neoplasm of breast: Secondary | ICD-10-CM | POA: Diagnosis not present

## 2016-07-22 DIAGNOSIS — H524 Presbyopia: Secondary | ICD-10-CM | POA: Diagnosis not present

## 2016-07-22 DIAGNOSIS — H43812 Vitreous degeneration, left eye: Secondary | ICD-10-CM | POA: Diagnosis not present

## 2016-07-22 DIAGNOSIS — E119 Type 2 diabetes mellitus without complications: Secondary | ICD-10-CM | POA: Diagnosis not present

## 2016-07-22 DIAGNOSIS — H40052 Ocular hypertension, left eye: Secondary | ICD-10-CM | POA: Diagnosis not present

## 2016-07-22 DIAGNOSIS — H16223 Keratoconjunctivitis sicca, not specified as Sjogren's, bilateral: Secondary | ICD-10-CM | POA: Diagnosis not present

## 2016-07-22 DIAGNOSIS — Z961 Presence of intraocular lens: Secondary | ICD-10-CM | POA: Diagnosis not present

## 2016-08-25 DIAGNOSIS — M9903 Segmental and somatic dysfunction of lumbar region: Secondary | ICD-10-CM | POA: Diagnosis not present

## 2016-08-25 DIAGNOSIS — M9902 Segmental and somatic dysfunction of thoracic region: Secondary | ICD-10-CM | POA: Diagnosis not present

## 2016-08-25 DIAGNOSIS — M9901 Segmental and somatic dysfunction of cervical region: Secondary | ICD-10-CM | POA: Diagnosis not present

## 2016-08-25 DIAGNOSIS — G542 Cervical root disorders, not elsewhere classified: Secondary | ICD-10-CM | POA: Diagnosis not present

## 2016-08-26 DIAGNOSIS — G542 Cervical root disorders, not elsewhere classified: Secondary | ICD-10-CM | POA: Diagnosis not present

## 2016-08-26 DIAGNOSIS — M9903 Segmental and somatic dysfunction of lumbar region: Secondary | ICD-10-CM | POA: Diagnosis not present

## 2016-08-26 DIAGNOSIS — M9901 Segmental and somatic dysfunction of cervical region: Secondary | ICD-10-CM | POA: Diagnosis not present

## 2016-08-26 DIAGNOSIS — M9902 Segmental and somatic dysfunction of thoracic region: Secondary | ICD-10-CM | POA: Diagnosis not present

## 2016-08-31 DIAGNOSIS — G544 Lumbosacral root disorders, not elsewhere classified: Secondary | ICD-10-CM | POA: Diagnosis not present

## 2016-08-31 DIAGNOSIS — M9904 Segmental and somatic dysfunction of sacral region: Secondary | ICD-10-CM | POA: Diagnosis not present

## 2016-08-31 DIAGNOSIS — M9905 Segmental and somatic dysfunction of pelvic region: Secondary | ICD-10-CM | POA: Diagnosis not present

## 2016-08-31 DIAGNOSIS — M9903 Segmental and somatic dysfunction of lumbar region: Secondary | ICD-10-CM | POA: Diagnosis not present

## 2016-09-03 DIAGNOSIS — G544 Lumbosacral root disorders, not elsewhere classified: Secondary | ICD-10-CM | POA: Diagnosis not present

## 2016-09-03 DIAGNOSIS — M9903 Segmental and somatic dysfunction of lumbar region: Secondary | ICD-10-CM | POA: Diagnosis not present

## 2016-09-03 DIAGNOSIS — M9905 Segmental and somatic dysfunction of pelvic region: Secondary | ICD-10-CM | POA: Diagnosis not present

## 2016-09-03 DIAGNOSIS — M9904 Segmental and somatic dysfunction of sacral region: Secondary | ICD-10-CM | POA: Diagnosis not present

## 2016-09-07 DIAGNOSIS — G544 Lumbosacral root disorders, not elsewhere classified: Secondary | ICD-10-CM | POA: Diagnosis not present

## 2016-09-07 DIAGNOSIS — M9904 Segmental and somatic dysfunction of sacral region: Secondary | ICD-10-CM | POA: Diagnosis not present

## 2016-09-07 DIAGNOSIS — M9905 Segmental and somatic dysfunction of pelvic region: Secondary | ICD-10-CM | POA: Diagnosis not present

## 2016-09-07 DIAGNOSIS — M9903 Segmental and somatic dysfunction of lumbar region: Secondary | ICD-10-CM | POA: Diagnosis not present

## 2016-09-09 DIAGNOSIS — G544 Lumbosacral root disorders, not elsewhere classified: Secondary | ICD-10-CM | POA: Diagnosis not present

## 2016-09-09 DIAGNOSIS — M9903 Segmental and somatic dysfunction of lumbar region: Secondary | ICD-10-CM | POA: Diagnosis not present

## 2016-09-09 DIAGNOSIS — M9904 Segmental and somatic dysfunction of sacral region: Secondary | ICD-10-CM | POA: Diagnosis not present

## 2016-09-10 DIAGNOSIS — I10 Essential (primary) hypertension: Secondary | ICD-10-CM | POA: Diagnosis not present

## 2016-09-10 DIAGNOSIS — E782 Mixed hyperlipidemia: Secondary | ICD-10-CM | POA: Diagnosis not present

## 2016-09-10 DIAGNOSIS — L639 Alopecia areata, unspecified: Secondary | ICD-10-CM | POA: Diagnosis not present

## 2016-09-10 DIAGNOSIS — E119 Type 2 diabetes mellitus without complications: Secondary | ICD-10-CM | POA: Diagnosis not present

## 2016-09-10 DIAGNOSIS — E559 Vitamin D deficiency, unspecified: Secondary | ICD-10-CM | POA: Diagnosis not present

## 2016-09-11 DIAGNOSIS — M9904 Segmental and somatic dysfunction of sacral region: Secondary | ICD-10-CM | POA: Diagnosis not present

## 2016-09-11 DIAGNOSIS — M9903 Segmental and somatic dysfunction of lumbar region: Secondary | ICD-10-CM | POA: Diagnosis not present

## 2016-09-11 DIAGNOSIS — M9905 Segmental and somatic dysfunction of pelvic region: Secondary | ICD-10-CM | POA: Diagnosis not present

## 2016-09-11 DIAGNOSIS — G544 Lumbosacral root disorders, not elsewhere classified: Secondary | ICD-10-CM | POA: Diagnosis not present

## 2016-09-15 DIAGNOSIS — M9905 Segmental and somatic dysfunction of pelvic region: Secondary | ICD-10-CM | POA: Diagnosis not present

## 2016-09-15 DIAGNOSIS — M9904 Segmental and somatic dysfunction of sacral region: Secondary | ICD-10-CM | POA: Diagnosis not present

## 2016-09-15 DIAGNOSIS — M9903 Segmental and somatic dysfunction of lumbar region: Secondary | ICD-10-CM | POA: Diagnosis not present

## 2016-09-15 DIAGNOSIS — G544 Lumbosacral root disorders, not elsewhere classified: Secondary | ICD-10-CM | POA: Diagnosis not present

## 2016-09-16 DIAGNOSIS — M9904 Segmental and somatic dysfunction of sacral region: Secondary | ICD-10-CM | POA: Diagnosis not present

## 2016-09-16 DIAGNOSIS — M9905 Segmental and somatic dysfunction of pelvic region: Secondary | ICD-10-CM | POA: Diagnosis not present

## 2016-09-16 DIAGNOSIS — G544 Lumbosacral root disorders, not elsewhere classified: Secondary | ICD-10-CM | POA: Diagnosis not present

## 2016-09-16 DIAGNOSIS — M9903 Segmental and somatic dysfunction of lumbar region: Secondary | ICD-10-CM | POA: Diagnosis not present

## 2016-09-18 DIAGNOSIS — M9903 Segmental and somatic dysfunction of lumbar region: Secondary | ICD-10-CM | POA: Diagnosis not present

## 2016-09-18 DIAGNOSIS — M9904 Segmental and somatic dysfunction of sacral region: Secondary | ICD-10-CM | POA: Diagnosis not present

## 2016-09-18 DIAGNOSIS — G544 Lumbosacral root disorders, not elsewhere classified: Secondary | ICD-10-CM | POA: Diagnosis not present

## 2016-09-18 DIAGNOSIS — M9905 Segmental and somatic dysfunction of pelvic region: Secondary | ICD-10-CM | POA: Diagnosis not present

## 2016-10-08 DIAGNOSIS — M9905 Segmental and somatic dysfunction of pelvic region: Secondary | ICD-10-CM | POA: Diagnosis not present

## 2016-10-08 DIAGNOSIS — M9903 Segmental and somatic dysfunction of lumbar region: Secondary | ICD-10-CM | POA: Diagnosis not present

## 2016-10-08 DIAGNOSIS — M9904 Segmental and somatic dysfunction of sacral region: Secondary | ICD-10-CM | POA: Diagnosis not present

## 2016-10-08 DIAGNOSIS — G544 Lumbosacral root disorders, not elsewhere classified: Secondary | ICD-10-CM | POA: Diagnosis not present

## 2016-11-05 DIAGNOSIS — M9904 Segmental and somatic dysfunction of sacral region: Secondary | ICD-10-CM | POA: Diagnosis not present

## 2016-11-05 DIAGNOSIS — G544 Lumbosacral root disorders, not elsewhere classified: Secondary | ICD-10-CM | POA: Diagnosis not present

## 2016-11-05 DIAGNOSIS — M9903 Segmental and somatic dysfunction of lumbar region: Secondary | ICD-10-CM | POA: Diagnosis not present

## 2016-11-05 DIAGNOSIS — M9905 Segmental and somatic dysfunction of pelvic region: Secondary | ICD-10-CM | POA: Diagnosis not present

## 2016-12-01 DIAGNOSIS — M9905 Segmental and somatic dysfunction of pelvic region: Secondary | ICD-10-CM | POA: Diagnosis not present

## 2016-12-01 DIAGNOSIS — M9903 Segmental and somatic dysfunction of lumbar region: Secondary | ICD-10-CM | POA: Diagnosis not present

## 2016-12-01 DIAGNOSIS — M9904 Segmental and somatic dysfunction of sacral region: Secondary | ICD-10-CM | POA: Diagnosis not present

## 2016-12-01 DIAGNOSIS — G544 Lumbosacral root disorders, not elsewhere classified: Secondary | ICD-10-CM | POA: Diagnosis not present

## 2016-12-14 DIAGNOSIS — E119 Type 2 diabetes mellitus without complications: Secondary | ICD-10-CM | POA: Diagnosis not present

## 2016-12-14 DIAGNOSIS — I10 Essential (primary) hypertension: Secondary | ICD-10-CM | POA: Diagnosis not present

## 2016-12-14 DIAGNOSIS — J069 Acute upper respiratory infection, unspecified: Secondary | ICD-10-CM | POA: Diagnosis not present

## 2017-01-21 DIAGNOSIS — M9904 Segmental and somatic dysfunction of sacral region: Secondary | ICD-10-CM | POA: Diagnosis not present

## 2017-01-21 DIAGNOSIS — G544 Lumbosacral root disorders, not elsewhere classified: Secondary | ICD-10-CM | POA: Diagnosis not present

## 2017-01-21 DIAGNOSIS — M9905 Segmental and somatic dysfunction of pelvic region: Secondary | ICD-10-CM | POA: Diagnosis not present

## 2017-01-21 DIAGNOSIS — M9903 Segmental and somatic dysfunction of lumbar region: Secondary | ICD-10-CM | POA: Diagnosis not present

## 2017-01-28 DIAGNOSIS — H40052 Ocular hypertension, left eye: Secondary | ICD-10-CM | POA: Diagnosis not present

## 2017-01-28 DIAGNOSIS — H16223 Keratoconjunctivitis sicca, not specified as Sjogren's, bilateral: Secondary | ICD-10-CM | POA: Diagnosis not present

## 2017-02-23 DIAGNOSIS — M9904 Segmental and somatic dysfunction of sacral region: Secondary | ICD-10-CM | POA: Diagnosis not present

## 2017-02-23 DIAGNOSIS — M9905 Segmental and somatic dysfunction of pelvic region: Secondary | ICD-10-CM | POA: Diagnosis not present

## 2017-02-23 DIAGNOSIS — M9903 Segmental and somatic dysfunction of lumbar region: Secondary | ICD-10-CM | POA: Diagnosis not present

## 2017-02-23 DIAGNOSIS — G544 Lumbosacral root disorders, not elsewhere classified: Secondary | ICD-10-CM | POA: Diagnosis not present

## 2017-04-19 ENCOUNTER — Other Ambulatory Visit: Payer: Self-pay | Admitting: Family Medicine

## 2017-04-19 DIAGNOSIS — Z1231 Encounter for screening mammogram for malignant neoplasm of breast: Secondary | ICD-10-CM

## 2017-05-20 ENCOUNTER — Ambulatory Visit
Admission: RE | Admit: 2017-05-20 | Discharge: 2017-05-20 | Disposition: A | Discharge status: Home / Self Care | Payer: Medicare PPO | Source: Ambulatory Visit | Attending: Family Medicine | Admitting: Family Medicine

## 2017-05-20 DIAGNOSIS — Z1231 Encounter for screening mammogram for malignant neoplasm of breast: Secondary | ICD-10-CM | POA: Diagnosis not present

## 2017-05-24 ENCOUNTER — Other Ambulatory Visit: Payer: Self-pay | Admitting: Family Medicine

## 2017-05-24 DIAGNOSIS — R928 Other abnormal and inconclusive findings on diagnostic imaging of breast: Secondary | ICD-10-CM

## 2017-05-26 ENCOUNTER — Ambulatory Visit
Admission: RE | Admit: 2017-05-26 | Discharge: 2017-05-26 | Disposition: A | Discharge status: Home / Self Care | Payer: Medicare PPO | Source: Ambulatory Visit | Attending: Family Medicine | Admitting: Family Medicine

## 2017-05-26 ENCOUNTER — Other Ambulatory Visit: Payer: Self-pay | Admitting: Family Medicine

## 2017-05-26 DIAGNOSIS — R928 Other abnormal and inconclusive findings on diagnostic imaging of breast: Secondary | ICD-10-CM | POA: Diagnosis not present

## 2017-05-26 DIAGNOSIS — N6489 Other specified disorders of breast: Secondary | ICD-10-CM | POA: Diagnosis not present

## 2017-05-28 ENCOUNTER — Other Ambulatory Visit: Payer: Medicare PPO

## 2017-06-03 ENCOUNTER — Other Ambulatory Visit: Payer: Self-pay | Admitting: Family Medicine

## 2017-06-03 ENCOUNTER — Ambulatory Visit
Admission: RE | Admit: 2017-06-03 | Discharge: 2017-06-03 | Disposition: A | Discharge status: Home / Self Care | Payer: Medicare PPO | Source: Ambulatory Visit | Attending: Family Medicine | Admitting: Family Medicine

## 2017-06-03 DIAGNOSIS — N6489 Other specified disorders of breast: Secondary | ICD-10-CM

## 2017-06-03 DIAGNOSIS — N6311 Unspecified lump in the right breast, upper outer quadrant: Secondary | ICD-10-CM | POA: Diagnosis not present

## 2017-06-03 DIAGNOSIS — C50411 Malignant neoplasm of upper-outer quadrant of right female breast: Secondary | ICD-10-CM | POA: Diagnosis not present

## 2017-06-07 ENCOUNTER — Ambulatory Visit: Payer: Self-pay | Admitting: General Surgery

## 2017-06-07 DIAGNOSIS — C50411 Malignant neoplasm of upper-outer quadrant of right female breast: Secondary | ICD-10-CM | POA: Diagnosis not present

## 2017-06-08 ENCOUNTER — Other Ambulatory Visit: Payer: Self-pay | Admitting: General Surgery

## 2017-06-08 DIAGNOSIS — C50411 Malignant neoplasm of upper-outer quadrant of right female breast: Secondary | ICD-10-CM

## 2017-06-09 ENCOUNTER — Encounter (HOSPITAL_COMMUNITY)
Admission: RE | Admit: 2017-06-09 | Discharge: 2017-06-09 | Disposition: A | Discharge status: Home / Self Care | Payer: Medicare PPO | Source: Ambulatory Visit | Attending: General Surgery | Admitting: General Surgery

## 2017-06-09 ENCOUNTER — Encounter (HOSPITAL_COMMUNITY): Payer: Self-pay

## 2017-06-09 ENCOUNTER — Ambulatory Visit
Admission: RE | Admit: 2017-06-09 | Discharge: 2017-06-09 | Disposition: A | Discharge status: Home / Self Care | Payer: Medicare PPO | Source: Ambulatory Visit | Attending: General Surgery | Admitting: General Surgery

## 2017-06-09 DIAGNOSIS — E559 Vitamin D deficiency, unspecified: Secondary | ICD-10-CM | POA: Diagnosis not present

## 2017-06-09 DIAGNOSIS — Z01812 Encounter for preprocedural laboratory examination: Secondary | ICD-10-CM | POA: Insufficient documentation

## 2017-06-09 DIAGNOSIS — I10 Essential (primary) hypertension: Secondary | ICD-10-CM | POA: Diagnosis not present

## 2017-06-09 DIAGNOSIS — E119 Type 2 diabetes mellitus without complications: Secondary | ICD-10-CM | POA: Diagnosis not present

## 2017-06-09 DIAGNOSIS — C50911 Malignant neoplasm of unspecified site of right female breast: Secondary | ICD-10-CM | POA: Diagnosis not present

## 2017-06-09 DIAGNOSIS — C50411 Malignant neoplasm of upper-outer quadrant of right female breast: Secondary | ICD-10-CM

## 2017-06-09 DIAGNOSIS — E785 Hyperlipidemia, unspecified: Secondary | ICD-10-CM | POA: Insufficient documentation

## 2017-06-09 HISTORY — DX: Malignant (primary) neoplasm, unspecified: C80.1

## 2017-06-09 HISTORY — DX: Presence of spectacles and contact lenses: Z97.3

## 2017-06-09 HISTORY — DX: Unspecified jaundice: R17

## 2017-06-09 HISTORY — DX: Nontoxic single thyroid nodule: E04.1

## 2017-06-09 HISTORY — DX: Presence of dental prosthetic device (complete) (partial): Z97.2

## 2017-06-09 HISTORY — DX: Unspecified glaucoma: H40.9

## 2017-06-09 LAB — CBC
HEMATOCRIT: 37.5 % (ref 36.0–46.0)
Hemoglobin: 12.5 g/dL (ref 12.0–15.0)
MCH: 30.6 pg (ref 26.0–34.0)
MCHC: 33.3 g/dL (ref 30.0–36.0)
MCV: 91.7 fL (ref 78.0–100.0)
Platelets: 222 10*3/uL (ref 150–400)
RBC: 4.09 MIL/uL (ref 3.87–5.11)
RDW: 13.5 % (ref 11.5–15.5)
WBC: 7.7 10*3/uL (ref 4.0–10.5)

## 2017-06-09 LAB — BASIC METABOLIC PANEL
ANION GAP: 8 (ref 5–15)
BUN: 16 mg/dL (ref 6–20)
CO2: 23 mmol/L (ref 22–32)
Calcium: 9.3 mg/dL (ref 8.9–10.3)
Chloride: 100 mmol/L — ABNORMAL LOW (ref 101–111)
Creatinine, Ser: 0.94 mg/dL (ref 0.44–1.00)
GFR, EST NON AFRICAN AMERICAN: 52 mL/min — AB (ref >60)
GLUCOSE: 103 mg/dL — AB (ref 65–99)
POTASSIUM: 4.1 mmol/L (ref 3.5–5.1)
Sodium: 131 mmol/L — ABNORMAL LOW (ref 135–145)

## 2017-06-09 LAB — GLUCOSE, CAPILLARY: Glucose-Capillary: 106 mg/dL — ABNORMAL HIGH (ref 65–99)

## 2017-06-09 LAB — HEMOGLOBIN A1C
HEMOGLOBIN A1C: 6.2 % — AB (ref 4.8–5.6)
MEAN PLASMA GLUCOSE: 131.24 mg/dL

## 2017-06-09 NOTE — Progress Notes (Signed)
Pt was notified at home to drink 8 ounces of water 2 hours prior to arrival on DOS. Pt verbalized that she will use a measuring cup to measure 8 ounces of water that she will drink at 9:30 am on DOS. Pt verbalized understanding of ERAS instructions.

## 2017-06-09 NOTE — Progress Notes (Signed)
Pt denies SOB, chest pain, and being under the care of a cardiologist. Pt stated that an echo was performed > 10 years ago but denies having a stress test and cardiac cath. Requested EKG and LOV note from PCP, Dr. Curlene Dolphin of [facility]. Pt denies recent labs. Pt denies having an A1c within the last 2 months.

## 2017-06-09 NOTE — Pre-Procedure Instructions (Signed)
Kristen Hill [facility]  06/09/2017      [facility] - [place], [place] - [address] [place] [address] Phone: [phone] Fax: [phone]    Your procedure is scheduled on Monday, June 14, 2017.  Report to [facility] Admitting at 11:30 A.M.  Call this number if you have problems the morning of surgery:  [phone]   Remember:  Do not eat food or drink liquids after midnight Sunday, June 13, 2017  Take these medicines the morning of surgery with A SIP OF WATER : amLODipine (NORVASC) Stop taking Aspirin,vitamins, fish oil and herbal medications. Do not take any NSAIDs ie: Ibuprofen, Advil, Naproxen (Aleve), Motrin, BC and Goody Powder or any medication containing Aspirin; stop now.    How to Manage Your Diabetes Before and After Surgery  Why is it important to control my blood sugar before and after surgery? . Improving blood sugar levels before and after surgery helps healing and can limit problems. . A way of improving blood sugar control is eating a healthy diet by: o  Eating less sugar and carbohydrates o  Increasing activity/exercise o  Talking with your doctor about reaching your blood sugar goals . High blood sugars (greater than 180 mg/dL) can raise your risk of infections and slow your recovery, so you will need to focus on controlling your diabetes during the weeks before surgery. . Make sure that the doctor who takes care of your diabetes knows about your planned surgery including the date and location.  How do I manage my blood sugar before surgery? . Check your blood sugar at least 4 times a day, starting 2 days before surgery, to make sure that the level is not too high or low. o Check your blood sugar the morning of your surgery when you wake up and every 2 hours until you get to the Short Stay unit. . If your blood sugar is less than 70 mg/dL, you will need to treat for low blood  sugar: o Do not take insulin. o Treat a low blood sugar (less than 70 mg/dL) with  cup of clear juice (cranberry or apple), 4 glucose tablets, OR glucose gel. o Recheck blood sugar in 15 minutes after treatment (to make sure it is greater than 70 mg/dL). If your blood sugar is not greater than 70 mg/dL on recheck, call [phone] for further instructions. . Report your blood sugar to the short stay nurse when you get to Short Stay.  . If you are admitted to the hospital after surgery: o Your blood sugar will be checked by the staff and you will probably be given insulin after surgery (instead of oral diabetes medicines) to make sure you have good blood sugar levels. o The goal for blood sugar control after surgery is 80-180 mg/dL.  WHAT DO I DO ABOUT MY DIABETES MEDICATION?  Marland Kitchen Do not take oral diabetes medicines (pills) the morning of surgery such as metFORMIN (GLUCOPHAGE)  Reviewed and Endorsed by [facility] Patient Education Committee, August 2015  Do not wear jewelry, make-up or nail polish.  Do not wear lotions, powders, or perfumes, or deoderant.  Do not shave 48 hours prior to surgery.    Do not bring valuables to the hospital.  [facility] is not responsible for any belongings or valuables.  Contacts, dentures or bridgework may not be worn into surgery.  Leave your suitcase in the car.  After surgery it may be  brought to your room. For patients admitted to the hospital, discharge time will be determined by your treatment team. Patients discharged the day of surgery will not be allowed to drive home.  Special instructions: Shower the night before surgery and the morning of surgery with CHG. Please read over the following fact sheets that you were given. Pain Booklet, Coughing and Deep Breathing and Surgical Site Infection Prevention

## 2017-06-10 ENCOUNTER — Telehealth: Payer: Self-pay | Admitting: Hematology and Oncology

## 2017-06-10 ENCOUNTER — Encounter: Payer: Self-pay | Admitting: Hematology and Oncology

## 2017-06-10 NOTE — Telephone Encounter (Signed)
Appt has been scheduled for the pt to see Dr. Lindi Adie on 9/28 at 12pm. Pt aware to arrive 30 minutes early. Address and insurance verified. Letter mailed.

## 2017-06-11 ENCOUNTER — Encounter: Payer: Self-pay | Admitting: Radiation Oncology

## 2017-06-14 ENCOUNTER — Ambulatory Visit (HOSPITAL_COMMUNITY)
Admission: RE | Admit: 2017-06-14 | Discharge: 2017-06-14 | Disposition: A | Discharge status: Home / Self Care | Payer: Medicare PPO | Source: Ambulatory Visit | Attending: General Surgery | Admitting: General Surgery

## 2017-06-14 ENCOUNTER — Ambulatory Visit (HOSPITAL_COMMUNITY): Payer: Medicare PPO | Admitting: Anesthesiology

## 2017-06-14 ENCOUNTER — Encounter (HOSPITAL_COMMUNITY)
Admission: RE | Disposition: A | Discharge status: Home / Self Care | Payer: Self-pay | Source: Ambulatory Visit | Attending: General Surgery

## 2017-06-14 ENCOUNTER — Encounter (HOSPITAL_COMMUNITY): Payer: Self-pay | Admitting: Anesthesiology

## 2017-06-14 ENCOUNTER — Telehealth: Payer: Self-pay | Admitting: Hematology and Oncology

## 2017-06-14 ENCOUNTER — Ambulatory Visit
Admission: RE | Admit: 2017-06-14 | Discharge: 2017-06-14 | Disposition: A | Discharge status: Home / Self Care | Payer: Medicare PPO | Source: Ambulatory Visit | Attending: General Surgery | Admitting: General Surgery

## 2017-06-14 DIAGNOSIS — Z01810 Encounter for preprocedural cardiovascular examination: Secondary | ICD-10-CM | POA: Insufficient documentation

## 2017-06-14 DIAGNOSIS — Z7984 Long term (current) use of oral hypoglycemic drugs: Secondary | ICD-10-CM | POA: Diagnosis not present

## 2017-06-14 DIAGNOSIS — Z79899 Other long term (current) drug therapy: Secondary | ICD-10-CM | POA: Insufficient documentation

## 2017-06-14 DIAGNOSIS — C50411 Malignant neoplasm of upper-outer quadrant of right female breast: Secondary | ICD-10-CM | POA: Insufficient documentation

## 2017-06-14 DIAGNOSIS — I10 Essential (primary) hypertension: Secondary | ICD-10-CM | POA: Insufficient documentation

## 2017-06-14 DIAGNOSIS — E784 Other hyperlipidemia: Secondary | ICD-10-CM | POA: Diagnosis not present

## 2017-06-14 DIAGNOSIS — Z01812 Encounter for preprocedural laboratory examination: Secondary | ICD-10-CM | POA: Diagnosis not present

## 2017-06-14 DIAGNOSIS — Z87891 Personal history of nicotine dependence: Secondary | ICD-10-CM | POA: Insufficient documentation

## 2017-06-14 DIAGNOSIS — E119 Type 2 diabetes mellitus without complications: Secondary | ICD-10-CM | POA: Insufficient documentation

## 2017-06-14 DIAGNOSIS — C50911 Malignant neoplasm of unspecified site of right female breast: Secondary | ICD-10-CM | POA: Diagnosis not present

## 2017-06-14 DIAGNOSIS — R928 Other abnormal and inconclusive findings on diagnostic imaging of breast: Secondary | ICD-10-CM | POA: Diagnosis not present

## 2017-06-14 HISTORY — PX: BREAST LUMPECTOMY WITH RADIOACTIVE SEED LOCALIZATION: SHX6424

## 2017-06-14 HISTORY — PX: BREAST LUMPECTOMY: SHX2

## 2017-06-14 LAB — GLUCOSE, CAPILLARY
GLUCOSE-CAPILLARY: 131 mg/dL — AB (ref 65–99)
GLUCOSE-CAPILLARY: 111 mg/dL — AB (ref 65–99)
Glucose-Capillary: 97 mg/dL (ref 65–99)

## 2017-06-14 SURGERY — BREAST LUMPECTOMY WITH RADIOACTIVE SEED LOCALIZATION
Anesthesia: General | Site: Breast | Laterality: Right

## 2017-06-14 MED ORDER — ONDANSETRON HCL 4 MG/2ML IJ SOLN
INTRAMUSCULAR | Status: DC | PRN
  Administered 2017-06-14: 4 mg via INTRAVENOUS

## 2017-06-14 MED ORDER — BUPIVACAINE-EPINEPHRINE 0.25% -1:200000 IJ SOLN
INTRAMUSCULAR | Status: DC | PRN
  Administered 2017-06-14: 20 mL

## 2017-06-14 MED ORDER — CHLORHEXIDINE GLUCONATE CLOTH 2 % EX PADS: 6.0000 | MEDICATED_PAD | Freq: Once | CUTANEOUS | Status: DC

## 2017-06-14 MED ORDER — FENTANYL CITRATE (PF) 100 MCG/2ML IJ SOLN
INTRAMUSCULAR | Status: DC | PRN
  Administered 2017-06-14: 25 ug via INTRAVENOUS

## 2017-06-14 MED ORDER — ACETAMINOPHEN 500 MG PO TABS
1000.0000 mg | ORAL_TABLET | ORAL | Status: AC
  Administered 2017-06-14: 1000 mg via ORAL
  Filled 2017-06-14: qty 2

## 2017-06-14 MED ORDER — FENTANYL CITRATE (PF) 250 MCG/5ML IJ SOLN
INTRAMUSCULAR | Status: AC
  Filled 2017-06-14: qty 5

## 2017-06-14 MED ORDER — 0.9 % SODIUM CHLORIDE (POUR BTL) OPTIME
TOPICAL | Status: DC | PRN
  Administered 2017-06-14: 1000 mL

## 2017-06-14 MED ORDER — LIDOCAINE 2% (20 MG/ML) 5 ML SYRINGE
INTRAMUSCULAR | Status: AC
  Filled 2017-06-14: qty 5

## 2017-06-14 MED ORDER — HYDROCODONE-ACETAMINOPHEN 5-325 MG PO TABS: 1.0000 | ORAL_TABLET | ORAL | 0 refills | Status: DC | PRN

## 2017-06-14 MED ORDER — LIDOCAINE 2% (20 MG/ML) 5 ML SYRINGE
INTRAMUSCULAR | Status: DC | PRN
  Administered 2017-06-14: 60 mg via INTRAVENOUS

## 2017-06-14 MED ORDER — BUPIVACAINE-EPINEPHRINE (PF) 0.25% -1:200000 IJ SOLN
INTRAMUSCULAR | Status: AC
  Filled 2017-06-14: qty 30

## 2017-06-14 MED ORDER — PROPOFOL 10 MG/ML IV BOLUS
INTRAVENOUS | Status: AC
  Filled 2017-06-14: qty 20

## 2017-06-14 MED ORDER — GABAPENTIN 300 MG PO CAPS
300.0000 mg | ORAL_CAPSULE | ORAL | Status: AC
  Administered 2017-06-14: 300 mg via ORAL
  Filled 2017-06-14: qty 1

## 2017-06-14 MED ORDER — LACTATED RINGERS IV SOLN
INTRAVENOUS | Status: DC
  Administered 2017-06-14: 12:00:00 via INTRAVENOUS

## 2017-06-14 MED ORDER — DEXTROSE 5 % IV SOLN
INTRAVENOUS | Status: DC | PRN
  Administered 2017-06-14: 25 ug/min via INTRAVENOUS

## 2017-06-14 MED ORDER — CEFAZOLIN SODIUM-DEXTROSE 2-4 GM/100ML-% IV SOLN
2.0000 g | INTRAVENOUS | Status: AC
  Administered 2017-06-14: 2 g via INTRAVENOUS
  Filled 2017-06-14: qty 100

## 2017-06-14 MED ORDER — PROPOFOL 10 MG/ML IV BOLUS
INTRAVENOUS | Status: DC | PRN
  Administered 2017-06-14: 140 mg via INTRAVENOUS

## 2017-06-14 MED ORDER — ONDANSETRON HCL 4 MG/2ML IJ SOLN
INTRAMUSCULAR | Status: AC
  Filled 2017-06-14: qty 2

## 2017-06-14 SURGICAL SUPPLY — 37 items
APPLIER CLIP 9.375 MED OPEN (MISCELLANEOUS)
BLADE SURG 15 STRL LF DISP TIS (BLADE) ×1 IMPLANT
BLADE SURG 15 STRL SS (BLADE) ×2
CANISTER SUCT 3000ML PPV (MISCELLANEOUS) ×3 IMPLANT
CHLORAPREP W/TINT 26ML (MISCELLANEOUS) ×3 IMPLANT
CLIP APPLIE 9.375 MED OPEN (MISCELLANEOUS) IMPLANT
COVER PROBE W GEL 5X96 (DRAPES) ×3 IMPLANT
COVER SURGICAL LIGHT HANDLE (MISCELLANEOUS) ×3 IMPLANT
DERMABOND ADVANCED (GAUZE/BANDAGES/DRESSINGS) ×2
DERMABOND ADVANCED .7 DNX12 (GAUZE/BANDAGES/DRESSINGS) ×1 IMPLANT
DEVICE DUBIN SPECIMEN MAMMOGRA (MISCELLANEOUS) ×3 IMPLANT
DRAPE CHEST BREAST 15X10 FENES (DRAPES) ×3 IMPLANT
DRAPE UTILITY XL STRL (DRAPES) ×3 IMPLANT
ELECT COATED BLADE 2.86 ST (ELECTRODE) ×3 IMPLANT
ELECT REM PT RETURN 9FT ADLT (ELECTROSURGICAL) ×3
ELECTRODE REM PT RTRN 9FT ADLT (ELECTROSURGICAL) ×1 IMPLANT
GLOVE BIO SURGEON STRL SZ7.5 (GLOVE) ×6 IMPLANT
GOWN STRL REUS W/ TWL LRG LVL3 (GOWN DISPOSABLE) ×2 IMPLANT
GOWN STRL REUS W/TWL LRG LVL3 (GOWN DISPOSABLE) ×4
KIT BASIN OR (CUSTOM PROCEDURE TRAY) ×3 IMPLANT
KIT MARKER MARGIN INK (KITS) ×3 IMPLANT
LIGHT WAVEGUIDE WIDE FLAT (MISCELLANEOUS) IMPLANT
NEEDLE HYPO 25GX1X1/2 BEV (NEEDLE) ×3 IMPLANT
NS IRRIG 1000ML POUR BTL (IV SOLUTION) ×3 IMPLANT
PACK SURGICAL SETUP 50X90 (CUSTOM PROCEDURE TRAY) ×3 IMPLANT
PENCIL BUTTON HOLSTER BLD 10FT (ELECTRODE) ×3 IMPLANT
SPONGE LAP 18X18 X RAY DECT (DISPOSABLE) ×3 IMPLANT
SUT MNCRL AB 4-0 PS2 18 (SUTURE) ×3 IMPLANT
SUT SILK 2 0 SH (SUTURE) IMPLANT
SUT VIC AB 3-0 SH 18 (SUTURE) ×3 IMPLANT
SYR BULB 3OZ (MISCELLANEOUS) ×3 IMPLANT
SYR CONTROL 10ML LL (SYRINGE) ×3 IMPLANT
TOWEL OR 17X24 6PK STRL BLUE (TOWEL DISPOSABLE) ×3 IMPLANT
TOWEL OR 17X26 10 PK STRL BLUE (TOWEL DISPOSABLE) ×3 IMPLANT
TUBE CONNECTING 12'X1/4 (SUCTIONS) ×1
TUBE CONNECTING 12X1/4 (SUCTIONS) ×2 IMPLANT
YANKAUER SUCT BULB TIP NO VENT (SUCTIONS) ×3 IMPLANT

## 2017-06-14 NOTE — Anesthesia Postprocedure Evaluation (Signed)
Anesthesia Post Note  Patient: Kristen Hill  Procedure(s) Performed: Procedure(s) (LRB): RIGH BREAST LUMPECTOMY WITH RADIOACTIVE SEED LOCALIZATION ERAS PATHWAY (Right)     Patient location during evaluation: PACU Anesthesia Type: General Level of consciousness: awake, awake and alert and oriented Pain management: pain level controlled Vital Signs Assessment: post-procedure vital signs reviewed and stable Respiratory status: nonlabored ventilation, respiratory function stable and spontaneous breathing Cardiovascular status: blood pressure returned to baseline Anesthetic complications: no    Last Vitals:  Vitals:   06/14/17 1600 06/14/17 1601  BP:  (!) 128/56  Pulse: 69 68  Resp: 11 10  Temp: (!) 36.2 C   SpO2: 99% 97%    Last Pain:  Vitals:   06/14/17 1616  TempSrc:   PainSc: 0-No pain                 Lundon Verdejo COKER

## 2017-06-14 NOTE — Anesthesia Preprocedure Evaluation (Signed)
Anesthesia Evaluation  Patient identified by MRN, date of birth, ID band Patient awake    Reviewed: Allergy & Precautions, NPO status , Patient's Chart, lab work & pertinent test results  Airway Mallampati: II  TM Distance: >3 FB Neck ROM: Full    Dental  (+) Teeth Intact, Dental Advisory Given   Pulmonary former smoker,    breath sounds clear to auscultation       Cardiovascular hypertension,  Rhythm:Regular Rate:Normal     Neuro/Psych    GI/Hepatic   Endo/Other  diabetes  Renal/GU      Musculoskeletal   Abdominal   Peds  Hematology   Anesthesia Other Findings   Reproductive/Obstetrics                             Anesthesia Physical Anesthesia Plan  ASA: III  Anesthesia Plan: General   Post-op Pain Management:    Induction: Intravenous  PONV Risk Score and Plan: Ondansetron and Metaclopromide  Airway Management Planned: LMA  Additional Equipment:   Intra-op Plan:   Post-operative Plan:   Informed Consent: I have reviewed the patients History and Physical, chart, labs and discussed the procedure including the risks, benefits and alternatives for the proposed anesthesia with the patient or authorized representative who has indicated his/her understanding and acceptance.   Dental advisory given  Plan Discussed with: CRNA and Anesthesiologist  Anesthesia Plan Comments:         Anesthesia Quick Evaluation

## 2017-06-14 NOTE — Anesthesia Procedure Notes (Signed)
Procedure Name: LMA Insertion Date/Time: 06/14/2017 1:58 PM Performed by: Rush Farmer E Pre-anesthesia Checklist: Patient identified, Emergency Drugs available and Suction available Patient Re-evaluated:Patient Re-evaluated prior to induction Oxygen Delivery Method: Circle system utilized Preoxygenation: Pre-oxygenation with 100% oxygen Induction Type: IV induction LMA: LMA inserted LMA Size: 3.0 Number of attempts: 1 Placement Confirmation: positive ETCO2 and breath sounds checked- equal and bilateral Tube secured with: Tape Dental Injury: Teeth and Oropharynx as per pre-operative assessment

## 2017-06-14 NOTE — Telephone Encounter (Signed)
Patient called and moved 9/28 new patient appointment to 10/2 due to transportation and needing her dtr to be with her. Patient has also spoken with radonc and radon appointment will also be moved to 10/2.   Patient has new date/time for new patient visit with Dr. Lindi Adie for 10/2.

## 2017-06-14 NOTE — Interval H&P Note (Signed)
History and Physical Interval Note:  06/14/2017 1:20 PM  Kristen Hill  has presented today for surgery, with the diagnosis of RIGHT BREAST CANCER  The various methods of treatment have been discussed with the patient and family. After consideration of risks, benefits and other options for treatment, the patient has consented to  Procedure(s): RIGH BREAST LUMPECTOMY WITH RADIOACTIVE SEED LOCALIZATION ERAS PATHWAY (Right) as a surgical intervention .  The patient's history has been reviewed, patient examined, no change in status, stable for surgery.  I have reviewed the patient's chart and labs.  Questions were answered to the patient's satisfaction.     TOTH III,Carlo Guevarra S

## 2017-06-14 NOTE — H&P (Signed)
Kristen Hill  Location: [facility] Patient #: [id number] DOB: 07-19-1926 Widowed / Language: Cleophus Molt / Race: White Female   History of Present Illness The patient is a 83 year old female who presents with breast cancer. We are asked to see the patient in consultation by Dr. Ammie Ferrier to evaluate her for a new right breast cancer. The patient is an 83 year old white female who recently went for a routine screening mammogram. At that time she was found to have a 1.2 cm abnormality in the upper outer right breast. This was biopsied and came back as an invasive breast cancer. The tumor markers are all still pending. Her axilla looked negative. She denies any significant breast pain. She has no family history of breast cancer. She is otherwise in pretty good health. She works out at [employer] 3 days a week.   Medication History  Calcium-Magnesium (Oral) Specific strength unknown - Active. Vitamin D (50000U Tablet, Oral) Active. Norvasc (5MG  Tablet, Oral) Active. Accu-Chek Active (In Vitro) Active. MetFORMIN HCl (500MG  Tablet, Oral) Active. Microlet Lancets Active. Medications Reconciled  Vitals  Weight: 145.6 lb Height: 61in Body Surface Area: 1.65 m Body Mass Index: 27.51 kg/m  Temp.: 97.16F  Pulse: 81 (Regular)  BP: 140/90 (Sitting, Left Arm, Standard)       Physical Exam  General Mental Status-Alert. General Appearance-Consistent with stated age. Hydration-Well hydrated. Voice-Normal.  Head and Neck Head-normocephalic, atraumatic with no lesions or palpable masses. Trachea-midline. Thyroid Gland Characteristics - normal size and consistency.  Eye Eyeball - Bilateral-Extraocular movements intact. Sclera/Conjunctiva - Bilateral-No scleral icterus.  Chest and Lung Exam Chest and lung exam reveals -quiet, even and easy respiratory effort with no use of accessory muscles and on auscultation, normal breath  sounds, no adventitious sounds and normal vocal resonance. Inspection Chest Wall - Normal. Back - normal.  Breast Note: There is a palpable bruise in the upper outer right breast centrally. Other than this there is no other palpable mass in either breast. There is no palpable axillary, supraclavicular, or cervical lymphadenopathy.   Cardiovascular Cardiovascular examination reveals -normal heart sounds, regular rate and rhythm with no murmurs and normal pedal pulses bilaterally.  Abdomen Inspection Inspection of the abdomen reveals - No Hernias. Skin - Scar - no surgical scars. Palpation/Percussion Palpation and Percussion of the abdomen reveal - Soft, Non Tender, No Rebound tenderness, No Rigidity (guarding) and No hepatosplenomegaly. Auscultation Auscultation of the abdomen reveals - Bowel sounds normal.  Neurologic Neurologic evaluation reveals -alert and oriented x 3 with no impairment of recent or remote memory. Mental Status-Normal.  Musculoskeletal Normal Exam - Left-Upper Extremity Strength Normal and Lower Extremity Strength Normal. Normal Exam - Right-Upper Extremity Strength Normal and Lower Extremity Strength Normal.  Lymphatic Head & Neck  General Head & Neck Lymphatics: Bilateral - Description - Normal. Axillary  General Axillary Region: Bilateral - Description - Normal. Tenderness - Non Tender. Femoral & Inguinal  Generalized Femoral & Inguinal Lymphatics: Bilateral - Description - Normal. Tenderness - Non Tender.     MALIGNANT NEOPLASM OF UPPER-OUTER QUADRANT OF RIGHT FEMALE BREAST, UNSPECIFIED ESTROGEN RECEPTOR STATUS (C50.411) Impression: The patient appears to have a small stage I cancer in the upper outer right breast. I have talked her in detail about the options for treatment and at this point she favors breast conservation. I think this is a very reasonable way to treat her cancer. Given her age she may not require a lymph node evaluation. I  will go ahead and refer  her to medical and radiation oncology. I have discussed with her in detail the risks and benefits of the operation as well as some of the technical aspects and she understands and wishes to proceed Current Plans Referred to Oncology, for evaluation and follow up (Oncology). Routine. Pt Education - Breast cancer: discussed with patient and provided information.

## 2017-06-14 NOTE — Transfer of Care (Signed)
Immediate Anesthesia Transfer of Care Note  Patient: Kristen Hill  Procedure(s) Performed: Procedure(s): RIGH BREAST LUMPECTOMY WITH RADIOACTIVE SEED LOCALIZATION ERAS PATHWAY (Right)  Patient Location: PACU  Anesthesia Type:General  Level of Consciousness: awake, alert  and oriented  Airway & Oxygen Therapy: Patient Spontanous Breathing and Patient connected to nasal cannula oxygen  Post-op Assessment: Report given to RN, Post -op Vital signs reviewed and stable and Patient moving all extremities X 4  Post vital signs: Reviewed and stable  Last Vitals:  Vitals:   06/14/17 1500 06/14/17 1501  BP:  (!) 126/49  Pulse: 72 73  Resp:  10  Temp:  (!) 36.2 C  SpO2: 100% 100%    Last Pain:  Vitals:   06/14/17 1501  TempSrc:   PainSc: Asleep         Complications: No apparent anesthesia complications

## 2017-06-14 NOTE — Op Note (Signed)
06/14/2017  2:55 PM  PATIENT:  Kristen Hill  83 y.o. female  PRE-OPERATIVE DIAGNOSIS:  RIGHT BREAST CANCER  POST-OPERATIVE DIAGNOSIS:  RIGHT BREAST CANCER  PROCEDURE:  Procedure(s): RIGH BREAST LUMPECTOMY WITH RADIOACTIVE SEED LOCALIZATION ERAS PATHWAY (Right)  SURGEON:  Surgeon(s) and Role:    * Jovita Kussmaul, MD - Primary  PHYSICIAN ASSISTANT:   ASSISTANTS: none   ANESTHESIA:   local and general  EBL:  Total I/O In: 300 [I.V.:300] Out: -   BLOOD ADMINISTERED:none  DRAINS: none   LOCAL MEDICATIONS USED:  MARCAINE     SPECIMEN:  Source of Specimen:  right breast tissue  DISPOSITION OF SPECIMEN:  PATHOLOGY  COUNTS:  YES  TOURNIQUET:  * No tourniquets in log *  DICTATION: .Dragon Dictation   After informed consent was obtained the patient was brought to the operating room and placed in the supine position on the operating table. After adequate induction of general anesthesia the patient's right breast was prepped with ChloraPrep, allowed to dry, and draped in usual sterile manner. An appropriate timeout was performed. Previously an I-125 seed was placed in the central portion of the right breast to mark an area of invasive breast cancer. The neoprobe was sent to I-125 in the area of radioactivity was readily identified in the upper outer central right breast. The area around this was infiltrated with quarter percent Marcaine. A curvilinear incision was made along the upper outer edge of the areola of the right breast with a 15 blade knife. The incision was carried through the skin and subcutaneous tissue sharply with electrocautery. Dissection was then carried out towards the radioactive seed under the direction of the neoprobe. Once I approach the radioactive seeds and a circular portion of breast tissue was excised sharply with the electrocautery around the radioactive seed will checking the area of radioactivity frequently. Once the specimen was removed it was  oriented with the appropriate paint colors. A specimen radiograph was obtained that showed the clip and seeds to be in the center of the specimen. The specimen was then sent to pathology for further evaluation. Hemostasis was achieved using the Bovie electrocautery. The wound was irrigated with saline and infiltrated with more quarter percent Marcaine. The cavity was marked with clips. The deep layer of the wound was then closed with layers of interrupted 3-0 Vicryl stitches. The skin was then closed with interrupted 4-0 Monocryl subcuticular stitches. Dermabond dressings were applied. The patient tolerated the procedure well. At the end of the case all needle sponge and instrument counts were correct. The patient was then awakened and taken to recovery in stable condition.  PLAN OF CARE: Discharge to home after PACU  PATIENT DISPOSITION:  PACU - hemodynamically stable.   Delay start of Pharmacological VTE agent (>24hrs) due to surgical blood loss or risk of bleeding: not applicable

## 2017-06-15 ENCOUNTER — Encounter (HOSPITAL_COMMUNITY): Payer: Self-pay | Admitting: General Surgery

## 2017-06-22 NOTE — Progress Notes (Signed)
Location of Breast Cancer: Right Breast  Histology per Pathology Report:  06/03/17 Diagnosis Breast, right, needle core biopsy, 10:00 o'clock - INVASIVE DUCTAL CARCINOMA WITH CALCIFICATIONS, SEE COMMENT. - DUCTAL CARCINOMA IN SITU.  Receptor Status: ER(95%), PR (90%), Her2-neu (NEG), Ki-(5%)  06/14/17 Diagnosis Breast, lumpectomy, Right w/seed - INVASIVE AND IN SITU DUCTAL CARCINOMA, WITH CALCIFICATIONS, 0.7 CM. - MARGINS NOT INVOLVED. - PREVIOUS BIOPSY SITE. - ATYPICAL LOBULAR HYPERPLASIA. - DUCTAL PAPILLOMA AND ATYPICAL DUCTAL HYPERPLASIA.  Did patient present with symptoms or was this found on screening mammography?: It was found on a screening mammogram.   Past/Anticipated interventions by surgeon, if any: 06/14/17 PROCEDURE:  Procedure(s): RIGH BREAST LUMPECTOMY WITH RADIOACTIVE SEED LOCALIZATION ERAS PATHWAY (Right) SURGEON:  Surgeon(s) and Role:    Jovita Kussmaul, MD - Primary  Past/Anticipated interventions by medical oncology, if any:  06/29/17 Dr. Lindi Adie.   Lymphedema issues, if any: She denies. She has good arm mobility   Pain issues, if any: She denies  SAFETY ISSUES:  Prior radiation? No  Pacemaker/ICD? No  Possible current pregnancy? No  Is the patient on methotrexate? No  Current Complaints / other details:  BP (!) 163/56   Pulse 80   Temp 97.7 F (36.5 C)   Ht 5' 1"  (1.549 m)   Wt 145 lb (65.8 kg)   SpO2 100% Comment: room air  BMI 27.40 kg/m     Wt Readings from Last 3 Encounters:  06/29/17 145 lb (65.8 kg)  06/14/17 146 lb (66.2 kg)  06/09/17 146 lb 8 oz (66.5 kg)      Kamdin Follett, Stephani Police, RN 06/22/2017,2:44 PM

## 2017-06-25 ENCOUNTER — Ambulatory Visit: Payer: Medicare PPO | Admitting: Radiation Oncology

## 2017-06-25 ENCOUNTER — Ambulatory Visit: Payer: Medicare PPO

## 2017-06-25 ENCOUNTER — Ambulatory Visit: Payer: Medicare PPO | Admitting: Hematology and Oncology

## 2017-06-28 ENCOUNTER — Ambulatory Visit: Payer: Medicare PPO | Admitting: Hematology and Oncology

## 2017-06-29 ENCOUNTER — Encounter: Payer: Self-pay | Admitting: Hematology and Oncology

## 2017-06-29 ENCOUNTER — Encounter: Payer: Self-pay | Admitting: *Deleted

## 2017-06-29 ENCOUNTER — Encounter: Payer: Self-pay | Admitting: Radiation Oncology

## 2017-06-29 ENCOUNTER — Ambulatory Visit
Admission: RE | Admit: 2017-06-29 | Discharge: 2017-06-29 | Disposition: A | Discharge status: Home / Self Care | Payer: Medicare PPO | Source: Ambulatory Visit | Attending: Radiation Oncology | Admitting: Radiation Oncology

## 2017-06-29 ENCOUNTER — Ambulatory Visit (HOSPITAL_BASED_OUTPATIENT_CLINIC_OR_DEPARTMENT_OTHER): Payer: Medicare PPO | Admitting: Hematology and Oncology

## 2017-06-29 DIAGNOSIS — M1990 Unspecified osteoarthritis, unspecified site: Secondary | ICD-10-CM | POA: Diagnosis not present

## 2017-06-29 DIAGNOSIS — C50411 Malignant neoplasm of upper-outer quadrant of right female breast: Secondary | ICD-10-CM | POA: Diagnosis not present

## 2017-06-29 DIAGNOSIS — Z803 Family history of malignant neoplasm of breast: Secondary | ICD-10-CM | POA: Diagnosis not present

## 2017-06-29 DIAGNOSIS — Z841 Family history of disorders of kidney and ureter: Secondary | ICD-10-CM | POA: Insufficient documentation

## 2017-06-29 DIAGNOSIS — E041 Nontoxic single thyroid nodule: Secondary | ICD-10-CM | POA: Insufficient documentation

## 2017-06-29 DIAGNOSIS — Z7984 Long term (current) use of oral hypoglycemic drugs: Secondary | ICD-10-CM | POA: Insufficient documentation

## 2017-06-29 DIAGNOSIS — Z882 Allergy status to sulfonamides status: Secondary | ICD-10-CM | POA: Insufficient documentation

## 2017-06-29 DIAGNOSIS — Z79899 Other long term (current) drug therapy: Secondary | ICD-10-CM | POA: Insufficient documentation

## 2017-06-29 DIAGNOSIS — Z833 Family history of diabetes mellitus: Secondary | ICD-10-CM | POA: Insufficient documentation

## 2017-06-29 DIAGNOSIS — E119 Type 2 diabetes mellitus without complications: Secondary | ICD-10-CM | POA: Insufficient documentation

## 2017-06-29 DIAGNOSIS — Z170 Estrogen receptor positive status [ER+]: Secondary | ICD-10-CM | POA: Diagnosis not present

## 2017-06-29 DIAGNOSIS — H409 Unspecified glaucoma: Secondary | ICD-10-CM | POA: Diagnosis not present

## 2017-06-29 DIAGNOSIS — Z91030 Bee allergy status: Secondary | ICD-10-CM | POA: Diagnosis not present

## 2017-06-29 DIAGNOSIS — Z87891 Personal history of nicotine dependence: Secondary | ICD-10-CM | POA: Insufficient documentation

## 2017-06-29 DIAGNOSIS — E785 Hyperlipidemia, unspecified: Secondary | ICD-10-CM | POA: Insufficient documentation

## 2017-06-29 DIAGNOSIS — Z510 Encounter for antineoplastic radiation therapy: Secondary | ICD-10-CM | POA: Diagnosis not present

## 2017-06-29 DIAGNOSIS — I10 Essential (primary) hypertension: Secondary | ICD-10-CM | POA: Insufficient documentation

## 2017-06-29 NOTE — Progress Notes (Signed)
[facility] CONSULT NOTE  Patient Care Team: Katherina Mires, MD as PCP - General (Family Medicine)  CHIEF COMPLAINTS/PURPOSE OF CONSULTATION:  Newly diagnosed breast cancer  HISTORY OF PRESENTING ILLNESS:  Kristen Hill 83 y.o. female is here because of recent diagnosis of left breast cancer. Patient had a routine screening mammogram that detected a small abnormality in the left breast. This was biopsy-proven to be invasive ductal carcinoma with DCIS. She underwent right lumpectomy on 06/14/2017 and was found to have grade 2 invasive ductal carcinoma 0.7 cm margins negative and it is ER/PR positive and HER-2 negative. She was seen by radiation oncology with Dr. Isidore Moos. The recommended radiation therapy. She is here today to discuss the role of antiestrogen therapy. In spite of her age, patient is in excellent physical health and is completely independent in her activities. She definitely looks 10 years younger than her stated age  I reviewed her records extensively and collaborated the history with the patient.  SUMMARY OF ONCOLOGIC HISTORY:   Malignant neoplasm of upper-outer quadrant of right breast in female, estrogen receptor positive ([place])   06/03/2017 Initial Diagnosis    Malignant neoplasm of upper-outer quadrant of right breast in female, estrogen receptor positive ([place])      06/14/2017 Surgery    Right lumpectomy: Grade 2 IDC with DCIS, 0.7 cm, margins negative, a ledge, ADH, ER 95%, PR 90%, HER-2 negative ratio 1.21, Ki-67 5%, T1b NX stage I a      MEDICAL HISTORY:  Past Medical History:  Diagnosis Date  . Arthritis    right knee, torn meniscus lateral and medial  . Cancer ([place])    right breast  . Diabetes mellitus without complication ([place]) 7711   type 2  . Glaucoma    left eye  . Hematuria 10/31/2013  . Hypertension   . Jaundice    in 1947  . Measles as a child  . Mumps as a child  . Other and unspecified hyperlipidemia 07/31/2013  . Physical  exam, annual 08/02/2013   Sees Dr Trinna Post at [facility] [address] Farris Has, Retinal specialist Dermatolgy  . Thyroid nodule   . Wears glasses   . Wears partial dentures     SURGICAL HISTORY: Past Surgical History:  Procedure Laterality Date  . BREAST LUMPECTOMY WITH RADIOACTIVE SEED LOCALIZATION Right 06/14/2017   Procedure: [place] BREAST LUMPECTOMY WITH RADIOACTIVE SEED LOCALIZATION ERAS PATHWAY;  Surgeon: Jovita Kussmaul, MD;  Location: [facility];  Service: General;  Laterality: Right;  . EYE SURGERY  2009 and 2010   cataract both eyes  . EYE SURGERY  2012   laser to left eye, chalazion was removed with laser  . KNEE SURGERY Right    medial meniscus and lateral menicus  . MULTIPLE TOOTH EXTRACTIONS    . TONSILLECTOMY  83 yrs old    SOCIAL HISTORY: Social History   Social History  . Marital status: Widowed    Spouse name: N/A  . Number of children: N/A  . Years of education: N/A   Occupational History  . Not on file.   Social History Main Topics  . Smoking status: Former Smoker    Packs/day: 0.10    Years: 1.00    Types: Cigarettes    Quit date: 09/28/1950  . Smokeless tobacco: Never Used  . Alcohol use No  . Drug use: No  . Sexual activity: No     Comment: lives with daughter, no dietary restrictions   Other Topics Concern  .  Not on file   Social History Narrative  . No narrative on file    FAMILY HISTORY: Family History  Problem Relation Age of Onset  . Cancer Mother        ovarian  . Breast cancer Mother   . Diabetes Father   . Kidney disease Father     ALLERGIES:  is allergic to bee venom and sulfa antibiotics.  MEDICATIONS:  Current Outpatient Prescriptions  Medication Sig Dispense Refill  . amLODipine (NORVASC) 5 MG tablet Take 1 tablet (5 mg total) by mouth daily. 90 tablet 1  . bimatoprost (LUMIGAN) 0.03 % ophthalmic solution Place 1 drop into the left eye at bedtime.    Marland Kitchen EPINEPHrine 0.3 mg/0.3 mL IJ SOAJ injection Inject 0.3 mg into the skin  once as needed. Allergic reaction    . glucose blood (BAYER CONTOUR TEST) test strip Check blood sugars q am and prn 100 each 5  . metFORMIN (GLUCOPHAGE) 500 MG tablet Take 1 tablet (500 mg total) by mouth 2 (two) times daily with a meal. 180 tablet 1  . MICROLET LANCETS MISC Check BS q am and prn 100 each 5  . Multiple Vitamin (MULTIVITAMIN WITH MINERALS) TABS tablet Take 1 tablet by mouth every other day.    . Vitamin D, Ergocalciferol, 2000 units CAPS Take 2,000 Units by mouth every other day.     No current facility-administered medications for this visit.     REVIEW OF SYSTEMS:   Constitutional: Denies fevers, chills or abnormal night sweats Eyes: Denies blurriness of vision, double vision or watery eyes Ears, nose, mouth, throat, and face: Denies mucositis or sore throat Respiratory: Denies cough, dyspnea or wheezes Cardiovascular: Denies palpitation, chest discomfort or lower extremity swelling Gastrointestinal:  Denies nausea, heartburn or change in bowel habits Skin: Denies abnormal skin rashes Lymphatics: Denies new lymphadenopathy or easy bruising Neurological:Denies numbness, tingling or new weaknesses Behavioral/Psych: Mood is stable, no new changes  Breast: Recent lumpectomy All other systems were reviewed with the patient and are negative.  PHYSICAL EXAMINATION: ECOG PERFORMANCE STATUS: 1 - Symptomatic but completely ambulatory  Vitals:   06/29/17 1243  BP: (!) 154/58  Pulse: 75  Resp: 16  Temp: 97.7 F (36.5 C)  SpO2: 99%   Filed Weights   06/29/17 1243  Weight: 144 lb 6.4 oz (65.5 kg)    GENERAL:alert, no distress and comfortable SKIN: skin color, texture, turgor are normal, no rashes or significant lesions EYES: normal, conjunctiva are pink and non-injected, sclera clear OROPHARYNX:no exudate, no erythema and lips, buccal mucosa, and tongue normal  NECK: supple, thyroid normal size, non-tender, without nodularity LYMPH:  no palpable lymphadenopathy in  the cervical, axillary or inguinal LUNGS: clear to auscultation and percussion with normal breathing effort HEART: regular rate & rhythm and no murmurs and no lower extremity edema ABDOMEN:abdomen soft, non-tender and normal bowel sounds Musculoskeletal:no cyanosis of digits and no clubbing  PSYCH: alert & oriented x 3 with fluent speech NEURO: no focal motor/sensory deficits  LABORATORY DATA:  I have reviewed the data as listed Lab Results  Component Value Date   WBC 7.7 06/09/2017   HGB 12.5 06/09/2017   HCT 37.5 06/09/2017   MCV 91.7 06/09/2017   PLT 222 06/09/2017   Lab Results  Component Value Date   NA 131 (L) 06/09/2017   K 4.1 06/09/2017   CL 100 (L) 06/09/2017   CO2 23 06/09/2017    RADIOGRAPHIC STUDIES: I have personally reviewed the radiological reports and agreed  with the findings in the report.  ASSESSMENT AND PLAN:  Malignant neoplasm of upper-outer quadrant of right breast in female, estrogen receptor positive ([place]) 06/14/2017: Right lumpectomy: Grade 2 IDC with DCIS, 0.7 cm, margins negative, a ledge, ADH, ER 95%, PR 90%, HER-2 negative ratio 1.21, Ki-67 5%, T1b NX stage I a  Pathology counseling: I discussed the final pathology report of the patient provided  a copy of this report. I discussed the margins. We also discussed the final staging along with previously performed ER/PR and HER-2/neu testing.  Recommendation: 1. +/- Adjuvant radiation 2. +/-adjuvant antiestrogen therapy with anastrozole 1 mg daily  Anastrozole counseling: We discussed the risks and benefits of anti-estrogen therapy with aromatase inhibitors. These include but not limited to insomnia, hot flashes, mood changes, vaginal dryness, bone density loss, and weight gain. We strongly believe that the benefits far outweigh the risks. Patient understands these risks and consented to starting treatment. Planned treatment duration is 5 years.  We will present her case in the tumor board and, per  the final treatment plan. Given her excellent physical health, we may decide to do both radiation and antiestrogen therapy. if she cannot tolerate antiestrogen seven-week and easily discontinue it.  If she undergoes radiation, I will see her back at the end of radiation therapy.  All questions were answered. The patient knows to call the clinic with any problems, questions or concerns.    Rulon Eisenmenger, MD 06/29/17

## 2017-06-29 NOTE — Assessment & Plan Note (Signed)
06/14/2017: Right lumpectomy: Grade 2 IDC with DCIS, 0.7 cm, margins negative, a ledge, ADH, ER 95%, PR 90%, HER-2 negative ratio 1.21, Ki-67 5%, T1b NX stage I a  Pathology counseling: I discussed the final pathology report of the patient provided  a copy of this report. I discussed the margins. We also discussed the final staging along with previously performed ER/PR and HER-2/neu testing.  Recommendation: 1. +/- Adjuvant radiation 2. +/-adjuvant antiestrogen therapy with anastrozole 1 mg daily  Anastrozole counseling: We discussed the risks and benefits of anti-estrogen therapy with aromatase inhibitors. These include but not limited to insomnia, hot flashes, mood changes, vaginal dryness, bone density loss, and weight gain. We strongly believe that the benefits far outweigh the risks. Patient understands these risks and consented to starting treatment. Planned treatment duration is 5 years.

## 2017-06-29 NOTE — Progress Notes (Signed)
[facility] Clinical Social Worker  Clinical Social Worker was referred by radiation oncologist for transportation needs.  CSW met with patient and family in exam room at [facility].  Patient expressed concerns for driving herself to daily radiation treatments.  CSW reviewed transportation resources and provided information for [facility] and Development worker, community or [employer].  Patient was appreciative of information and CSW contact.  CSW provided contact information and encouraged patient to call with additional questions or concerns.    Johnnye Lana, MSW, LCSW, OSW-C Clinical Social Worker [facility] [phone]

## 2017-06-29 NOTE — Progress Notes (Signed)
Radiation Oncology         [phone] ________________________________  Initial Outpatient Consultation  Name: Kristen Hill MRN: [id number]  Date: 06/29/2017  DOB: [birth year]  RJ:JOACZYS, Jannifer Rodney, MD  Jovita Kussmaul, MD   REFERRING PHYSICIAN: Autumn Messing III, MD  DIAGNOSIS:    ICD-10-CM   1. Malignant neoplasm of upper-outer quadrant of right breast in female, estrogen receptor positive ([place]) C50.411    Z17.0    Stage IA Right Breast UOQ Invasive Ductal Carcinoma, ER(+) / PR(+) / Her2(-), Grade 2, Pathologic Stage pT1b, pNX, pMX   CHIEF COMPLAINT: Here to discuss management of right breast cancer  HISTORY OF PRESENT ILLNESS::Kristen Hill is a 83 y.o. female who presented with possible mass with calcifications in the right breast found on 05/20/2017 bilateral screening mammogram. Diagnostic right mammogram was performed on 05/26/2017 and showed a very subtle mass at the 10:00 position, with no evidence of right axillary lymphadenopathy. Biopsy of the mass on 06/03/2017 showed invasive ductal carcinoma with calcifications and DCIS, with characteristics as described above in the diagnosis.  Subsequently, the patient saw Dr. Autumn Messing who recommended breast conservation and adjuvant chemoradiotherapy. Due to her age and imaging results, Dr. Marlou Starks did not feel sentinel lymph node biopsy was necessary. She underwent right breast lumpectomy on 06/14/2017. Final pathology revealed invasive and in situ ductal carcinoma with calcifications. The tumor measured 0.7 cm and is grade 2. Margins were negative by at least 2 mm to in situ and invasive disease. Her disease is strongly ER/PR positive and Her2 negative. She reports that her next follow-up with Dr. Marlou Starks is on Thursday 10/04, and she is seeing Dr. Lindi Adie today at 1:00.  The patient presents today to discuss adjuvant radiotherapy and the role it could play in the treatment of her disease. She is accompanied by her daughter. She is  a very healthy-looking 83 year old and reports that she exercises three days a week. She denies any post-operative symptoms or side effects. She is asking about methods of transportation to get to and from her treatments. She reports a mass in her neck that she has had since 1993. CT Cervical Spine in 2016 showed a left thyroid lobe mass measuring 2.3 x 1.8 cm. Nonemergent thyroid ultrasound follow-up was recommended at that time to ensure benignity. The patient says she had a reassuring ultrasound at [facility] in 2017 and that the mass has not been biopsied.  PREVIOUS RADIATION THERAPY: No  PAST MEDICAL HISTORY:  has a past medical history of Arthritis; Cancer ([place]); Diabetes mellitus without complication ([place]) (0630); Glaucoma; Hematuria (10/31/2013); Hypertension; Jaundice; Measles (as a child); Mumps (as a child); Other and unspecified hyperlipidemia (07/31/2013); Physical exam, annual (08/02/2013); Thyroid nodule; Wears glasses; and Wears partial dentures.    PAST SURGICAL HISTORY: Past Surgical History:  Procedure Laterality Date  . BREAST LUMPECTOMY WITH RADIOACTIVE SEED LOCALIZATION Right 06/14/2017   Procedure: [place] BREAST LUMPECTOMY WITH RADIOACTIVE SEED LOCALIZATION ERAS PATHWAY;  Surgeon: Jovita Kussmaul, MD;  Location: [facility];  Service: General;  Laterality: Right;  . EYE SURGERY  2009 and 2010   cataract both eyes  . EYE SURGERY  2012   laser to left eye, chalazion was removed with laser  . KNEE SURGERY Right    medial meniscus and lateral menicus  . MULTIPLE TOOTH EXTRACTIONS    . TONSILLECTOMY  84 yrs old    FAMILY HISTORY: family history includes Breast cancer in her mother; Cancer in her mother;  Diabetes in her father; Kidney disease in her father.  SOCIAL HISTORY:  reports that she quit smoking about 66 years ago. Her smoking use included Cigarettes. She has a 0.10 pack-year smoking history. She has never used smokeless tobacco. She reports that she does not drink alcohol or use  drugs.  ALLERGIES: Bee venom and Sulfa antibiotics  MEDICATIONS:  Current Outpatient Prescriptions  Medication Sig Dispense Refill  . amLODipine (NORVASC) 5 MG tablet Take 1 tablet (5 mg total) by mouth daily. 90 tablet 1  . bimatoprost (LUMIGAN) 0.03 % ophthalmic solution Place 1 drop into the left eye at bedtime.    Marland Kitchen EPINEPHrine 0.3 mg/0.3 mL IJ SOAJ injection Inject 0.3 mg into the skin once as needed. Allergic reaction    . glucose blood (BAYER CONTOUR TEST) test strip Check blood sugars q am and prn 100 each 5  . metFORMIN (GLUCOPHAGE) 500 MG tablet Take 1 tablet (500 mg total) by mouth 2 (two) times daily with a meal. 180 tablet 1  . MICROLET LANCETS MISC Check BS q am and prn 100 each 5  . Multiple Vitamin (MULTIVITAMIN WITH MINERALS) TABS tablet Take 1 tablet by mouth every other day.    . Vitamin D, Ergocalciferol, 2000 units CAPS Take 2,000 Units by mouth every other day.     No current facility-administered medications for this encounter.    REVIEW OF SYSTEMS: as above   PHYSICAL EXAM:  height is 5' 1" (1.549 m) and weight is 145 lb (65.8 kg). Her temperature is 97.7 F (36.5 C). Her blood pressure is 163/56 (abnormal) and her pulse is 80. Her oxygen saturation is 100%.   General: Alert and oriented, in no acute distress. HEENT: Head is normocephalic. Oropharynx is clear. Neck: Neck is supple. She has roughly a 2-2.5 cm mass in the anterior Level II region on her neck on the left. Heart: Regular in rate and rhythm with no murmurs, rubs, or gallops. Chest: Clear to auscultation bilaterally, with no rhonchi, wheezes, or rales. Abdomen: Soft, nontender, nondistended, with no rigidity or guarding. Extremities: Edema in her lower extremities.  Lymphatics: see Neck Exam Skin: No concerning lesions. Musculoskeletal: Symmetric strength and muscle tone throughout. Neurologic: Cranial nerves II through XII are grossly intact. No obvious focalities. Speech is fluent. Coordination is  intact. Psychiatric: Judgment and insight are intact. Affect is appropriate. Breasts: She has some edema in the UOQ of her right breast consistent with a post-operative seroma. The scar along the areola is healing well. No other palpable masses appreciated in the left breast or axilla.   ECOG = 1  0 - Asymptomatic (Fully active, able to carry on all predisease activities without restriction)  1 - Symptomatic but completely ambulatory (Restricted in physically strenuous activity but ambulatory and able to carry out work of a light or sedentary nature. For example, light housework, office work)  2 - Symptomatic, <50% in bed during the day (Ambulatory and capable of all self care but unable to carry out any work activities. Up and about more than 50% of waking hours)  3 - Symptomatic, >50% in bed, but not bedbound (Capable of only limited self-care, confined to bed or chair 50% or more of waking hours)  4 - Bedbound (Completely disabled. Cannot carry on any self-care. Totally confined to bed or chair)  5 - Death   Eustace Pen MM, Creech RH, Tormey DC, et al. [phone]). "Toxicity and response criteria of the [facility]". [place] Oncol. 5 (  6): 649-55   LABORATORY DATA:  Lab Results  Component Value Date   WBC 7.7 06/09/2017   HGB 12.5 06/09/2017   HCT 37.5 06/09/2017   MCV 91.7 06/09/2017   PLT 222 06/09/2017   CMP     Component Value Date/Time   NA 131 (L) 06/09/2017 1217   K 4.1 06/09/2017 1217   CL 100 (L) 06/09/2017 1217   CO2 23 06/09/2017 1217   GLUCOSE 103 (H) 06/09/2017 1217   BUN 16 06/09/2017 1217   CREATININE 0.94 06/09/2017 1217   CREATININE 1.04 10/25/2013 1216   CALCIUM 9.3 06/09/2017 1217   PROT 7.4 10/25/2013 1216   ALBUMIN 4.0 10/25/2013 1216   ALBUMIN 4.0 10/25/2013 1216   AST 18 10/25/2013 1216   ALT 14 10/25/2013 1216   ALKPHOS 60 10/25/2013 1216   BILITOT 0.8 10/25/2013 1216   GFRNONAA 52 (L) 06/09/2017 1217   GFRAA >60 06/09/2017  1217         RADIOGRAPHY: Mm Breast Surgical Specimen  Result Date: 06/14/2017 CLINICAL DATA:  Patient status post right breast lumpectomy. EXAM: SPECIMEN RADIOGRAPH OF THE RIGHT BREAST COMPARISON:  Previous exam(s). FINDINGS: Status post excision of the right breast. The radioactive seed and biopsy marker clip are present, completely intact, and were marked for pathology. IMPRESSION: Specimen radiograph of the right breast. Electronically Signed   By: Lovey Newcomer M.D.   On: 06/14/2017 14:36   Mm Breast Surgical Specimen  Result Date: 06/03/2017 CLINICAL DATA:  Specimen radiograph from an ultrasound-guided biopsy of a right breast mass with calcifications. EXAM: SPECIMEN RADIOGRAPH OF THE RIGHT BREAST COMPARISON:  Previous exam(s). FINDINGS: Status post excision of the right breast. Four core biopsy samples were imaged to evaluate for calcifications. Several calcifications are seen in at least one, and likely two of the samples. IMPRESSION: Specimen radiograph of the right breast. Electronically Signed   By: Ammie Ferrier M.D.   On: 06/03/2017 11:54   Mm Rt Radioactive Seed Loc Mammo Guide  Result Date: 06/09/2017 CLINICAL DATA:  83 year old female with recently diagnosed right breast cancer. EXAM: MAMMOGRAPHIC GUIDED RADIOACTIVE SEED LOCALIZATION OF THE RIGHT BREAST COMPARISON:  Previous exam(s). FINDINGS: Patient presents for radioactive seed localization prior to right breast lumpectomy. I met with the patient and we discussed the procedure of seed localization including benefits and alternatives. We discussed the high likelihood of a successful procedure. We discussed the risks of the procedure including infection, bleeding, tissue injury and further surgery. We discussed the low dose of radioactivity involved in the procedure. Informed, written consent was given. The usual time-out protocol was performed immediately prior to the procedure. Using mammographic guidance, sterile technique, 1%  lidocaine and an I-125 radioactive seed, the mass and associated ribbon shaped biopsy marking clip in the right breast was localized using a lateral to medial approach. The follow-up mammogram images confirm the seed in the expected location and were marked for Dr. Marlou Starks. Follow-up survey of the patient confirms presence of the radioactive seed. Order number of I-125 seed:  [id number]. Total activity:  1.117 millicuries  Reference Date: 06/03/2017 The patient tolerated the procedure well and was released from the [facility]. She was given instructions regarding seed removal. IMPRESSION: Radioactive seed localization right breast. No apparent complications. Electronically Signed   By: Everlean Alstrom M.D.   On: 06/09/2017 15:33   Mm Clip Placement Right  Result Date: 06/03/2017 CLINICAL DATA:  Post biopsy mammogram of the right breast for clip placement. EXAM: DIAGNOSTIC RIGHT MAMMOGRAM POST  ULTRASOUND BIOPSY COMPARISON:  Previous exam(s). FINDINGS: Mammographic images were obtained following ultrasound guided biopsy of a right breast mass at 10 o'clock. The ribbon shaped biopsy marking clip is well positioned at the site of biopsy at 10 o'clock. The tomosynthesis images do demonstrate that the clip is located at the site of the small mass with calcifications identified mammographically. IMPRESSION: Appropriate positioning of the ribbon shaped biopsy marking clip at the site of the mass at 10 o'clock in the right breast. This does correspond with the mass with calcifications identified mammographically. Final Assessment: Post Procedure Mammograms for Marker Placement Electronically Signed   By: Ammie Ferrier M.D.   On: 06/03/2017 11:55   [place] Rt Breast Bx W Loc Dev 1st Lesion Img Bx Spec US Guide  Addendum Date: 06/04/2017   ADDENDUM REPORT: 06/04/2017 15:17 ADDENDUM: Pathology revealed GRADE III INVASIVE DUCTAL CARCINOMA WITH CALCIFICATIONS of the Right breast, 10:00 o'clock. This was found to be  concordant by Dr. Ammie Ferrier. Pathology results were discussed with the patient and her daughter, Benjamine Sprague, by telephone. The patient reported doing well after the biopsy with tenderness at the site. Post biopsy instructions and care were reviewed and questions were answered. The patient was encouraged to call [facility] for any additional concerns. Surgical consultation has been arranged with Dr. Autumn Messing at [facility] on June 07, 2017. Pathology results reported by Terie Purser, RN on 06/04/2017. Electronically Signed   By: Ammie Ferrier M.D.   On: 06/04/2017 15:17   Result Date: 06/04/2017 CLINICAL DATA:  83 year old female presenting for ultrasound-guided biopsy of a right breast mass with calcifications. EXAM: ULTRASOUND GUIDED RIGHT BREAST CORE NEEDLE BIOPSY COMPARISON:  Previous exam(s). FINDINGS: I met with the patient and we discussed the procedure of ultrasound-guided biopsy, including benefits and alternatives. We discussed the high likelihood of a successful procedure. We discussed the risks of the procedure, including infection, bleeding, tissue injury, clip migration, and inadequate sampling. Informed written consent was given. The usual time-out protocol was performed immediately prior to the procedure. Lesion quadrant: Upper-outer quadrant Using sterile technique and 1% Lidocaine as local anesthetic, under direct ultrasound visualization, a 14 gauge spring-loaded device was used to perform biopsy of a mass at 10 o'clock in the right breast using a medial approach. A specimen radiograph was taken of the biopsy samples demonstrating calcifications in at least 1 of the samples. At the conclusion of the procedure a ribbon shaped tissue marker clip was deployed into the biopsy cavity. Follow up 2 view mammogram was performed and dictated separately. IMPRESSION: Ultrasound guided biopsy of right breast mass at 10 o'clock. No apparent  complications. Electronically Signed: By: Ammie Ferrier M.D. On: 06/03/2017 11:52      IMPRESSION/PLAN: Right Breast Cancer   It was a pleasure meeting the patient today. We discussed the risks, benefits, and side effects of radiotherapy. I recommend she either opt for an antiestrogen pill or  radiotherapy to the right breast to reduce her risk of locoregional recurrence by about 2/3. We discussed the option of both adjuvant therapies together, but I don't think both together are necessary for her favorable prognosis.  We discussed that radiation would take approximately 3.5 weeks to complete and that I would give the patient a few weeks to heal following surgery before starting treatment planning.  We spoke about acute effects including skin irritation and fatigue as well as much less common late effects including internal organ injury or irritation. We  spoke about the latest technology that is used to minimize the risk of late effects for patients undergoing radiotherapy to the breast or chest wall. No guarantees of treatment were given. The patient is scheduled to see Dr. Lindi Adie today for the anti-estrogen pill discussion.  After seeing Dr. Lindi Adie, she was advised to call me with her decision on whether to proceed with radiotherapy - I gave her my card. We discussed and signed the consent form for radiation therapy. I look forward to participating in the patient's care if she decides to proceed.   I Called Radiology to verfiy that she doesn't need a post op mammogram, as her calcifications appear to have been cleared.  Also, she will meet with social work today to discuss transportation to and from the [facility]  I spent 40 minutes  face to face with the patient and more than 50% of that time was spent in counseling and/or coordination of care.   __________________________________________   Eppie Gibson, MD  This document serves as a record of services personally performed by Eppie Gibson, MD.  It was created on her behalf by Rae Lips, a trained medical scribe. The creation of this record is based on the scribe's personal observations and the provider's statements to them. This document has been checked and approved by the attending provider.

## 2017-07-09 ENCOUNTER — Encounter (HOSPITAL_COMMUNITY): Payer: Self-pay | Admitting: General Surgery

## 2017-07-09 NOTE — Addendum Note (Signed)
Addendum  created 07/09/17 1006 by Roberts Gaudy, MD   Anesthesia Event edited, Anesthesia Staff edited

## 2017-07-13 ENCOUNTER — Ambulatory Visit: Admission: RE | Admit: 2017-07-13 | Payer: Medicare PPO | Source: Ambulatory Visit | Admitting: Radiation Oncology

## 2017-07-13 ENCOUNTER — Ambulatory Visit
Admission: RE | Admit: 2017-07-13 | Discharge: 2017-07-13 | Disposition: A | Discharge status: Home / Self Care | Payer: Medicare PPO | Source: Ambulatory Visit | Attending: Radiation Oncology | Admitting: Radiation Oncology

## 2017-07-13 DIAGNOSIS — I10 Essential (primary) hypertension: Secondary | ICD-10-CM | POA: Diagnosis not present

## 2017-07-13 DIAGNOSIS — C50411 Malignant neoplasm of upper-outer quadrant of right female breast: Secondary | ICD-10-CM

## 2017-07-13 DIAGNOSIS — H409 Unspecified glaucoma: Secondary | ICD-10-CM | POA: Diagnosis not present

## 2017-07-13 DIAGNOSIS — Z510 Encounter for antineoplastic radiation therapy: Secondary | ICD-10-CM | POA: Diagnosis not present

## 2017-07-13 DIAGNOSIS — Z170 Estrogen receptor positive status [ER+]: Secondary | ICD-10-CM | POA: Diagnosis not present

## 2017-07-13 DIAGNOSIS — E119 Type 2 diabetes mellitus without complications: Secondary | ICD-10-CM | POA: Diagnosis not present

## 2017-07-13 DIAGNOSIS — Z79899 Other long term (current) drug therapy: Secondary | ICD-10-CM | POA: Diagnosis not present

## 2017-07-13 DIAGNOSIS — Z7984 Long term (current) use of oral hypoglycemic drugs: Secondary | ICD-10-CM | POA: Diagnosis not present

## 2017-07-13 DIAGNOSIS — M1990 Unspecified osteoarthritis, unspecified site: Secondary | ICD-10-CM | POA: Diagnosis not present

## 2017-07-13 NOTE — Progress Notes (Addendum)
  Radiation Oncology         [phone] ________________________________  Name: Kristen Hill MRN: [id number]  Date: 07/13/2017  DOB: 15-Feb-1926  SIMULATION AND TREATMENT PLANNING NOTE    Outpatient  DIAGNOSIS:     ICD-10-CM   1. Malignant neoplasm of upper-outer quadrant of right breast in female, estrogen receptor positive ([place]) C50.411    Z17.0     NARRATIVE:  The patient was brought to the [facility].  Identity was confirmed.  All relevant records and images related to the planned course of therapy were reviewed.  The patient freely provided informed written consent to proceed with treatment after reviewing the details related to the planned course of therapy. The consent form was witnessed and verified by the simulation staff.    Then, the patient was set-up in a stable reproducible supine position for radiation therapy with her ipsilateral arm over her head, and her upper body secured in a custom-made Vac-lok device.  CT images were obtained.  Surface markings were placed.  The CT images were loaded into the planning software.    TREATMENT PLANNING NOTE: Treatment planning then occurred.  The radiation prescription was entered and confirmed.     A total of 3 medically necessary complex treatment devices were fabricated and supervised by me: 2 fields with MLCs for custom blocks to protect heart, and lungs;  and, a Vac-lok. MORE COMPLEX DEVICES MAY BE MADE IN DOSIMETRY FOR FIELD IN FIELD BEAMS FOR DOSE HOMOGENEITY.  I have requested : 3D Simulation which is medically necessary to give adequate dose to at risk tissues while sparing lungs and heart.  I have requested a DVH of the following structures: lungs, heart, lumpectomy cavity on right.    The patient will receive 42.56 Gy in 16 fractions to the right breast/axilla with 2 high tangential fields.  This will not be followed by a boost.  Optical Surface Tracking Plan:  Since intensity modulated radiotherapy  (IMRT) and 3D conformal radiation treatment methods are predicated on accurate and precise positioning for treatment, intrafraction motion monitoring is medically necessary to ensure accurate and safe treatment delivery. The ability to quantify intrafraction motion without excessive ionizing radiation dose can only be performed with optical surface tracking. Accordingly, surface imaging offers the opportunity to obtain 3D measurements of patient position throughout IMRT and 3D treatments without excessive radiation exposure. I am ordering optical surface tracking for this patient's upcoming course of radiotherapy.  ________________________________   Reference:  Ursula Alert, J, et al. Surface imaging-based analysis of intrafraction motion for breast radiotherapy patients.Journal of [place], n. 6, nov. 2014. ISSN [id number].  Available at: <http://www.jacmp.org/index.php/jacmp/article/view/4957>.    -----------------------------------  Eppie Gibson, MD

## 2017-07-13 NOTE — Addendum Note (Signed)
Encounter addended by: Eppie Gibson, MD on: 07/13/2017  4:41 PM<BR>    Actions taken: Sign clinical note

## 2017-07-16 DIAGNOSIS — M1990 Unspecified osteoarthritis, unspecified site: Secondary | ICD-10-CM | POA: Diagnosis not present

## 2017-07-16 DIAGNOSIS — E119 Type 2 diabetes mellitus without complications: Secondary | ICD-10-CM | POA: Diagnosis not present

## 2017-07-16 DIAGNOSIS — Z170 Estrogen receptor positive status [ER+]: Secondary | ICD-10-CM | POA: Diagnosis not present

## 2017-07-16 DIAGNOSIS — Z7984 Long term (current) use of oral hypoglycemic drugs: Secondary | ICD-10-CM | POA: Diagnosis not present

## 2017-07-16 DIAGNOSIS — Z79899 Other long term (current) drug therapy: Secondary | ICD-10-CM | POA: Diagnosis not present

## 2017-07-16 DIAGNOSIS — Z510 Encounter for antineoplastic radiation therapy: Secondary | ICD-10-CM | POA: Diagnosis not present

## 2017-07-16 DIAGNOSIS — H409 Unspecified glaucoma: Secondary | ICD-10-CM | POA: Diagnosis not present

## 2017-07-16 DIAGNOSIS — I10 Essential (primary) hypertension: Secondary | ICD-10-CM | POA: Diagnosis not present

## 2017-07-16 DIAGNOSIS — C50411 Malignant neoplasm of upper-outer quadrant of right female breast: Secondary | ICD-10-CM | POA: Diagnosis not present

## 2017-07-20 ENCOUNTER — Ambulatory Visit
Admission: RE | Admit: 2017-07-20 | Discharge: 2017-07-20 | Disposition: A | Discharge status: Home / Self Care | Payer: Medicare PPO | Source: Ambulatory Visit | Attending: Radiation Oncology | Admitting: Radiation Oncology

## 2017-07-20 DIAGNOSIS — Z170 Estrogen receptor positive status [ER+]: Secondary | ICD-10-CM | POA: Diagnosis not present

## 2017-07-20 DIAGNOSIS — M1990 Unspecified osteoarthritis, unspecified site: Secondary | ICD-10-CM | POA: Diagnosis not present

## 2017-07-20 DIAGNOSIS — Z510 Encounter for antineoplastic radiation therapy: Secondary | ICD-10-CM | POA: Diagnosis not present

## 2017-07-20 DIAGNOSIS — C50411 Malignant neoplasm of upper-outer quadrant of right female breast: Secondary | ICD-10-CM | POA: Diagnosis not present

## 2017-07-20 DIAGNOSIS — E119 Type 2 diabetes mellitus without complications: Secondary | ICD-10-CM | POA: Diagnosis not present

## 2017-07-20 DIAGNOSIS — Z79899 Other long term (current) drug therapy: Secondary | ICD-10-CM | POA: Diagnosis not present

## 2017-07-20 DIAGNOSIS — I10 Essential (primary) hypertension: Secondary | ICD-10-CM | POA: Diagnosis not present

## 2017-07-20 DIAGNOSIS — Z7984 Long term (current) use of oral hypoglycemic drugs: Secondary | ICD-10-CM | POA: Diagnosis not present

## 2017-07-20 DIAGNOSIS — H409 Unspecified glaucoma: Secondary | ICD-10-CM | POA: Diagnosis not present

## 2017-07-21 ENCOUNTER — Ambulatory Visit
Admission: RE | Admit: 2017-07-21 | Discharge: 2017-07-21 | Disposition: A | Discharge status: Home / Self Care | Payer: Medicare PPO | Source: Ambulatory Visit | Attending: Radiation Oncology | Admitting: Radiation Oncology

## 2017-07-21 DIAGNOSIS — Z510 Encounter for antineoplastic radiation therapy: Secondary | ICD-10-CM | POA: Diagnosis not present

## 2017-07-21 DIAGNOSIS — Z79899 Other long term (current) drug therapy: Secondary | ICD-10-CM | POA: Diagnosis not present

## 2017-07-21 DIAGNOSIS — Z170 Estrogen receptor positive status [ER+]: Secondary | ICD-10-CM | POA: Diagnosis not present

## 2017-07-21 DIAGNOSIS — M1990 Unspecified osteoarthritis, unspecified site: Secondary | ICD-10-CM | POA: Diagnosis not present

## 2017-07-21 DIAGNOSIS — I10 Essential (primary) hypertension: Secondary | ICD-10-CM | POA: Diagnosis not present

## 2017-07-21 DIAGNOSIS — C50411 Malignant neoplasm of upper-outer quadrant of right female breast: Secondary | ICD-10-CM | POA: Diagnosis not present

## 2017-07-21 DIAGNOSIS — Z7984 Long term (current) use of oral hypoglycemic drugs: Secondary | ICD-10-CM | POA: Diagnosis not present

## 2017-07-21 DIAGNOSIS — E119 Type 2 diabetes mellitus without complications: Secondary | ICD-10-CM | POA: Diagnosis not present

## 2017-07-21 DIAGNOSIS — H409 Unspecified glaucoma: Secondary | ICD-10-CM | POA: Diagnosis not present

## 2017-07-21 MED ORDER — ALRA NON-METALLIC DEODORANT (RAD-ONC)
1.0000 "application " | Freq: Once | TOPICAL | Status: AC
  Administered 2017-07-21: 1 via TOPICAL

## 2017-07-21 MED ORDER — RADIAPLEXRX EX GEL
Freq: Once | CUTANEOUS | Status: AC
  Administered 2017-07-21: 16:00:00 via TOPICAL

## 2017-07-21 NOTE — Progress Notes (Signed)

## 2017-07-22 ENCOUNTER — Ambulatory Visit
Admission: RE | Admit: 2017-07-22 | Discharge: 2017-07-22 | Disposition: A | Discharge status: Home / Self Care | Payer: Medicare PPO | Source: Ambulatory Visit | Attending: Radiation Oncology | Admitting: Radiation Oncology

## 2017-07-22 DIAGNOSIS — C50411 Malignant neoplasm of upper-outer quadrant of right female breast: Secondary | ICD-10-CM | POA: Diagnosis not present

## 2017-07-22 DIAGNOSIS — E119 Type 2 diabetes mellitus without complications: Secondary | ICD-10-CM | POA: Diagnosis not present

## 2017-07-22 DIAGNOSIS — M1990 Unspecified osteoarthritis, unspecified site: Secondary | ICD-10-CM | POA: Diagnosis not present

## 2017-07-22 DIAGNOSIS — Z7984 Long term (current) use of oral hypoglycemic drugs: Secondary | ICD-10-CM | POA: Diagnosis not present

## 2017-07-22 DIAGNOSIS — Z510 Encounter for antineoplastic radiation therapy: Secondary | ICD-10-CM | POA: Diagnosis not present

## 2017-07-22 DIAGNOSIS — I10 Essential (primary) hypertension: Secondary | ICD-10-CM | POA: Diagnosis not present

## 2017-07-22 DIAGNOSIS — H409 Unspecified glaucoma: Secondary | ICD-10-CM | POA: Diagnosis not present

## 2017-07-22 DIAGNOSIS — Z170 Estrogen receptor positive status [ER+]: Secondary | ICD-10-CM | POA: Diagnosis not present

## 2017-07-22 DIAGNOSIS — Z79899 Other long term (current) drug therapy: Secondary | ICD-10-CM | POA: Diagnosis not present

## 2017-07-23 ENCOUNTER — Ambulatory Visit
Admission: RE | Admit: 2017-07-23 | Discharge: 2017-07-23 | Disposition: A | Discharge status: Home / Self Care | Payer: Medicare PPO | Source: Ambulatory Visit | Attending: Radiation Oncology | Admitting: Radiation Oncology

## 2017-07-23 DIAGNOSIS — Z79899 Other long term (current) drug therapy: Secondary | ICD-10-CM | POA: Diagnosis not present

## 2017-07-23 DIAGNOSIS — E119 Type 2 diabetes mellitus without complications: Secondary | ICD-10-CM | POA: Diagnosis not present

## 2017-07-23 DIAGNOSIS — Z170 Estrogen receptor positive status [ER+]: Secondary | ICD-10-CM | POA: Diagnosis not present

## 2017-07-23 DIAGNOSIS — Z510 Encounter for antineoplastic radiation therapy: Secondary | ICD-10-CM | POA: Diagnosis not present

## 2017-07-23 DIAGNOSIS — Z7984 Long term (current) use of oral hypoglycemic drugs: Secondary | ICD-10-CM | POA: Diagnosis not present

## 2017-07-23 DIAGNOSIS — M1990 Unspecified osteoarthritis, unspecified site: Secondary | ICD-10-CM | POA: Diagnosis not present

## 2017-07-23 DIAGNOSIS — H409 Unspecified glaucoma: Secondary | ICD-10-CM | POA: Diagnosis not present

## 2017-07-23 DIAGNOSIS — I10 Essential (primary) hypertension: Secondary | ICD-10-CM | POA: Diagnosis not present

## 2017-07-23 DIAGNOSIS — C50411 Malignant neoplasm of upper-outer quadrant of right female breast: Secondary | ICD-10-CM | POA: Diagnosis not present

## 2017-07-26 ENCOUNTER — Ambulatory Visit
Admission: RE | Admit: 2017-07-26 | Discharge: 2017-07-26 | Disposition: A | Discharge status: Home / Self Care | Payer: Medicare PPO | Source: Ambulatory Visit | Attending: Radiation Oncology | Admitting: Radiation Oncology

## 2017-07-26 DIAGNOSIS — H409 Unspecified glaucoma: Secondary | ICD-10-CM | POA: Diagnosis not present

## 2017-07-26 DIAGNOSIS — M1990 Unspecified osteoarthritis, unspecified site: Secondary | ICD-10-CM | POA: Diagnosis not present

## 2017-07-26 DIAGNOSIS — Z79899 Other long term (current) drug therapy: Secondary | ICD-10-CM | POA: Diagnosis not present

## 2017-07-26 DIAGNOSIS — I10 Essential (primary) hypertension: Secondary | ICD-10-CM | POA: Diagnosis not present

## 2017-07-26 DIAGNOSIS — E119 Type 2 diabetes mellitus without complications: Secondary | ICD-10-CM | POA: Diagnosis not present

## 2017-07-26 DIAGNOSIS — Z170 Estrogen receptor positive status [ER+]: Secondary | ICD-10-CM | POA: Diagnosis not present

## 2017-07-26 DIAGNOSIS — Z510 Encounter for antineoplastic radiation therapy: Secondary | ICD-10-CM | POA: Diagnosis not present

## 2017-07-26 DIAGNOSIS — Z7984 Long term (current) use of oral hypoglycemic drugs: Secondary | ICD-10-CM | POA: Diagnosis not present

## 2017-07-26 DIAGNOSIS — C50411 Malignant neoplasm of upper-outer quadrant of right female breast: Secondary | ICD-10-CM | POA: Diagnosis not present

## 2017-07-27 ENCOUNTER — Ambulatory Visit
Admission: RE | Admit: 2017-07-27 | Discharge: 2017-07-27 | Disposition: A | Discharge status: Home / Self Care | Payer: Medicare PPO | Source: Ambulatory Visit | Attending: Radiation Oncology | Admitting: Radiation Oncology

## 2017-07-27 DIAGNOSIS — Z510 Encounter for antineoplastic radiation therapy: Secondary | ICD-10-CM | POA: Diagnosis not present

## 2017-07-27 DIAGNOSIS — H409 Unspecified glaucoma: Secondary | ICD-10-CM | POA: Diagnosis not present

## 2017-07-27 DIAGNOSIS — I10 Essential (primary) hypertension: Secondary | ICD-10-CM | POA: Diagnosis not present

## 2017-07-27 DIAGNOSIS — M1990 Unspecified osteoarthritis, unspecified site: Secondary | ICD-10-CM | POA: Diagnosis not present

## 2017-07-27 DIAGNOSIS — Z7984 Long term (current) use of oral hypoglycemic drugs: Secondary | ICD-10-CM | POA: Diagnosis not present

## 2017-07-27 DIAGNOSIS — Z170 Estrogen receptor positive status [ER+]: Secondary | ICD-10-CM | POA: Diagnosis not present

## 2017-07-27 DIAGNOSIS — Z79899 Other long term (current) drug therapy: Secondary | ICD-10-CM | POA: Diagnosis not present

## 2017-07-27 DIAGNOSIS — C50411 Malignant neoplasm of upper-outer quadrant of right female breast: Secondary | ICD-10-CM | POA: Diagnosis not present

## 2017-07-27 DIAGNOSIS — E119 Type 2 diabetes mellitus without complications: Secondary | ICD-10-CM | POA: Diagnosis not present

## 2017-07-28 ENCOUNTER — Ambulatory Visit
Admission: RE | Admit: 2017-07-28 | Discharge: 2017-07-28 | Disposition: A | Discharge status: Home / Self Care | Payer: Medicare PPO | Source: Ambulatory Visit | Attending: Radiation Oncology | Admitting: Radiation Oncology

## 2017-07-28 DIAGNOSIS — H409 Unspecified glaucoma: Secondary | ICD-10-CM | POA: Diagnosis not present

## 2017-07-28 DIAGNOSIS — Z510 Encounter for antineoplastic radiation therapy: Secondary | ICD-10-CM | POA: Diagnosis not present

## 2017-07-28 DIAGNOSIS — Z7984 Long term (current) use of oral hypoglycemic drugs: Secondary | ICD-10-CM | POA: Diagnosis not present

## 2017-07-28 DIAGNOSIS — Z170 Estrogen receptor positive status [ER+]: Secondary | ICD-10-CM | POA: Diagnosis not present

## 2017-07-28 DIAGNOSIS — Z79899 Other long term (current) drug therapy: Secondary | ICD-10-CM | POA: Diagnosis not present

## 2017-07-28 DIAGNOSIS — E119 Type 2 diabetes mellitus without complications: Secondary | ICD-10-CM | POA: Diagnosis not present

## 2017-07-28 DIAGNOSIS — M1990 Unspecified osteoarthritis, unspecified site: Secondary | ICD-10-CM | POA: Diagnosis not present

## 2017-07-28 DIAGNOSIS — I10 Essential (primary) hypertension: Secondary | ICD-10-CM | POA: Diagnosis not present

## 2017-07-28 DIAGNOSIS — C50411 Malignant neoplasm of upper-outer quadrant of right female breast: Secondary | ICD-10-CM | POA: Diagnosis not present

## 2017-07-29 ENCOUNTER — Ambulatory Visit
Admission: RE | Admit: 2017-07-29 | Discharge: 2017-07-29 | Disposition: A | Discharge status: Home / Self Care | Payer: Medicare PPO | Source: Ambulatory Visit | Attending: Radiation Oncology | Admitting: Radiation Oncology

## 2017-07-29 DIAGNOSIS — Z7984 Long term (current) use of oral hypoglycemic drugs: Secondary | ICD-10-CM | POA: Diagnosis not present

## 2017-07-29 DIAGNOSIS — I10 Essential (primary) hypertension: Secondary | ICD-10-CM | POA: Diagnosis not present

## 2017-07-29 DIAGNOSIS — Z79899 Other long term (current) drug therapy: Secondary | ICD-10-CM | POA: Diagnosis not present

## 2017-07-29 DIAGNOSIS — Z170 Estrogen receptor positive status [ER+]: Secondary | ICD-10-CM | POA: Diagnosis not present

## 2017-07-29 DIAGNOSIS — Z510 Encounter for antineoplastic radiation therapy: Secondary | ICD-10-CM | POA: Diagnosis not present

## 2017-07-29 DIAGNOSIS — H409 Unspecified glaucoma: Secondary | ICD-10-CM | POA: Diagnosis not present

## 2017-07-29 DIAGNOSIS — E119 Type 2 diabetes mellitus without complications: Secondary | ICD-10-CM | POA: Diagnosis not present

## 2017-07-29 DIAGNOSIS — C50411 Malignant neoplasm of upper-outer quadrant of right female breast: Secondary | ICD-10-CM | POA: Diagnosis not present

## 2017-07-29 DIAGNOSIS — M1990 Unspecified osteoarthritis, unspecified site: Secondary | ICD-10-CM | POA: Diagnosis not present

## 2017-07-30 ENCOUNTER — Ambulatory Visit
Admission: RE | Admit: 2017-07-30 | Discharge: 2017-07-30 | Disposition: A | Discharge status: Home / Self Care | Payer: Medicare PPO | Source: Ambulatory Visit | Attending: Radiation Oncology | Admitting: Radiation Oncology

## 2017-07-30 DIAGNOSIS — E119 Type 2 diabetes mellitus without complications: Secondary | ICD-10-CM | POA: Diagnosis not present

## 2017-07-30 DIAGNOSIS — Z510 Encounter for antineoplastic radiation therapy: Secondary | ICD-10-CM | POA: Diagnosis not present

## 2017-07-30 DIAGNOSIS — M1990 Unspecified osteoarthritis, unspecified site: Secondary | ICD-10-CM | POA: Diagnosis not present

## 2017-07-30 DIAGNOSIS — I10 Essential (primary) hypertension: Secondary | ICD-10-CM | POA: Diagnosis not present

## 2017-07-30 DIAGNOSIS — Z170 Estrogen receptor positive status [ER+]: Secondary | ICD-10-CM | POA: Diagnosis not present

## 2017-07-30 DIAGNOSIS — Z7984 Long term (current) use of oral hypoglycemic drugs: Secondary | ICD-10-CM | POA: Diagnosis not present

## 2017-07-30 DIAGNOSIS — Z79899 Other long term (current) drug therapy: Secondary | ICD-10-CM | POA: Diagnosis not present

## 2017-07-30 DIAGNOSIS — C50411 Malignant neoplasm of upper-outer quadrant of right female breast: Secondary | ICD-10-CM | POA: Diagnosis not present

## 2017-07-30 DIAGNOSIS — H409 Unspecified glaucoma: Secondary | ICD-10-CM | POA: Diagnosis not present

## 2017-08-02 ENCOUNTER — Ambulatory Visit
Admission: RE | Admit: 2017-08-02 | Discharge: 2017-08-02 | Disposition: A | Discharge status: Home / Self Care | Payer: Medicare PPO | Source: Ambulatory Visit | Attending: Radiation Oncology | Admitting: Radiation Oncology

## 2017-08-02 DIAGNOSIS — H43812 Vitreous degeneration, left eye: Secondary | ICD-10-CM | POA: Diagnosis not present

## 2017-08-02 DIAGNOSIS — H16223 Keratoconjunctivitis sicca, not specified as Sjogren's, bilateral: Secondary | ICD-10-CM | POA: Diagnosis not present

## 2017-08-02 DIAGNOSIS — Z79899 Other long term (current) drug therapy: Secondary | ICD-10-CM | POA: Diagnosis not present

## 2017-08-02 DIAGNOSIS — H524 Presbyopia: Secondary | ICD-10-CM | POA: Diagnosis not present

## 2017-08-02 DIAGNOSIS — Z961 Presence of intraocular lens: Secondary | ICD-10-CM | POA: Diagnosis not present

## 2017-08-02 DIAGNOSIS — H409 Unspecified glaucoma: Secondary | ICD-10-CM | POA: Diagnosis not present

## 2017-08-02 DIAGNOSIS — Z83511 Family history of glaucoma: Secondary | ICD-10-CM | POA: Diagnosis not present

## 2017-08-02 DIAGNOSIS — Z510 Encounter for antineoplastic radiation therapy: Secondary | ICD-10-CM | POA: Diagnosis not present

## 2017-08-02 DIAGNOSIS — M1990 Unspecified osteoarthritis, unspecified site: Secondary | ICD-10-CM | POA: Diagnosis not present

## 2017-08-02 DIAGNOSIS — C50411 Malignant neoplasm of upper-outer quadrant of right female breast: Secondary | ICD-10-CM | POA: Diagnosis not present

## 2017-08-02 DIAGNOSIS — I10 Essential (primary) hypertension: Secondary | ICD-10-CM | POA: Diagnosis not present

## 2017-08-02 DIAGNOSIS — Z7984 Long term (current) use of oral hypoglycemic drugs: Secondary | ICD-10-CM | POA: Diagnosis not present

## 2017-08-02 DIAGNOSIS — Z170 Estrogen receptor positive status [ER+]: Secondary | ICD-10-CM | POA: Diagnosis not present

## 2017-08-02 DIAGNOSIS — H40052 Ocular hypertension, left eye: Secondary | ICD-10-CM | POA: Diagnosis not present

## 2017-08-02 DIAGNOSIS — E119 Type 2 diabetes mellitus without complications: Secondary | ICD-10-CM | POA: Diagnosis not present

## 2017-08-03 ENCOUNTER — Ambulatory Visit
Admission: RE | Admit: 2017-08-03 | Discharge: 2017-08-03 | Disposition: A | Discharge status: Home / Self Care | Payer: Medicare PPO | Source: Ambulatory Visit | Attending: Radiation Oncology | Admitting: Radiation Oncology

## 2017-08-03 DIAGNOSIS — Z170 Estrogen receptor positive status [ER+]: Secondary | ICD-10-CM | POA: Diagnosis not present

## 2017-08-03 DIAGNOSIS — E119 Type 2 diabetes mellitus without complications: Secondary | ICD-10-CM | POA: Diagnosis not present

## 2017-08-03 DIAGNOSIS — I10 Essential (primary) hypertension: Secondary | ICD-10-CM | POA: Diagnosis not present

## 2017-08-03 DIAGNOSIS — M1990 Unspecified osteoarthritis, unspecified site: Secondary | ICD-10-CM | POA: Diagnosis not present

## 2017-08-03 DIAGNOSIS — H409 Unspecified glaucoma: Secondary | ICD-10-CM | POA: Diagnosis not present

## 2017-08-03 DIAGNOSIS — Z7984 Long term (current) use of oral hypoglycemic drugs: Secondary | ICD-10-CM | POA: Diagnosis not present

## 2017-08-03 DIAGNOSIS — Z79899 Other long term (current) drug therapy: Secondary | ICD-10-CM | POA: Diagnosis not present

## 2017-08-03 DIAGNOSIS — Z510 Encounter for antineoplastic radiation therapy: Secondary | ICD-10-CM | POA: Diagnosis not present

## 2017-08-03 DIAGNOSIS — C50411 Malignant neoplasm of upper-outer quadrant of right female breast: Secondary | ICD-10-CM | POA: Diagnosis not present

## 2017-08-04 ENCOUNTER — Ambulatory Visit
Admission: RE | Admit: 2017-08-04 | Discharge: 2017-08-04 | Disposition: A | Discharge status: Home / Self Care | Payer: Medicare PPO | Source: Ambulatory Visit | Attending: Radiation Oncology | Admitting: Radiation Oncology

## 2017-08-04 DIAGNOSIS — I10 Essential (primary) hypertension: Secondary | ICD-10-CM | POA: Diagnosis not present

## 2017-08-04 DIAGNOSIS — H409 Unspecified glaucoma: Secondary | ICD-10-CM | POA: Diagnosis not present

## 2017-08-04 DIAGNOSIS — Z79899 Other long term (current) drug therapy: Secondary | ICD-10-CM | POA: Diagnosis not present

## 2017-08-04 DIAGNOSIS — C50411 Malignant neoplasm of upper-outer quadrant of right female breast: Secondary | ICD-10-CM

## 2017-08-04 DIAGNOSIS — Z170 Estrogen receptor positive status [ER+]: Secondary | ICD-10-CM | POA: Diagnosis not present

## 2017-08-04 DIAGNOSIS — E119 Type 2 diabetes mellitus without complications: Secondary | ICD-10-CM | POA: Diagnosis not present

## 2017-08-04 DIAGNOSIS — M1990 Unspecified osteoarthritis, unspecified site: Secondary | ICD-10-CM | POA: Diagnosis not present

## 2017-08-04 DIAGNOSIS — Z7984 Long term (current) use of oral hypoglycemic drugs: Secondary | ICD-10-CM | POA: Diagnosis not present

## 2017-08-04 DIAGNOSIS — Z510 Encounter for antineoplastic radiation therapy: Secondary | ICD-10-CM | POA: Diagnosis not present

## 2017-08-04 MED ORDER — RADIAPLEXRX EX GEL
Freq: Once | CUTANEOUS | Status: AC
  Administered 2017-08-04: 13:00:00 via TOPICAL

## 2017-08-05 ENCOUNTER — Ambulatory Visit
Admission: RE | Admit: 2017-08-05 | Discharge: 2017-08-05 | Disposition: A | Discharge status: Home / Self Care | Payer: Medicare PPO | Source: Ambulatory Visit | Attending: Radiation Oncology | Admitting: Radiation Oncology

## 2017-08-05 DIAGNOSIS — E119 Type 2 diabetes mellitus without complications: Secondary | ICD-10-CM | POA: Diagnosis not present

## 2017-08-05 DIAGNOSIS — H409 Unspecified glaucoma: Secondary | ICD-10-CM | POA: Diagnosis not present

## 2017-08-05 DIAGNOSIS — C50411 Malignant neoplasm of upper-outer quadrant of right female breast: Secondary | ICD-10-CM | POA: Diagnosis not present

## 2017-08-05 DIAGNOSIS — Z7984 Long term (current) use of oral hypoglycemic drugs: Secondary | ICD-10-CM | POA: Diagnosis not present

## 2017-08-05 DIAGNOSIS — Z510 Encounter for antineoplastic radiation therapy: Secondary | ICD-10-CM | POA: Diagnosis not present

## 2017-08-05 DIAGNOSIS — M1990 Unspecified osteoarthritis, unspecified site: Secondary | ICD-10-CM | POA: Diagnosis not present

## 2017-08-05 DIAGNOSIS — Z170 Estrogen receptor positive status [ER+]: Secondary | ICD-10-CM | POA: Diagnosis not present

## 2017-08-05 DIAGNOSIS — Z79899 Other long term (current) drug therapy: Secondary | ICD-10-CM | POA: Diagnosis not present

## 2017-08-05 DIAGNOSIS — I10 Essential (primary) hypertension: Secondary | ICD-10-CM | POA: Diagnosis not present

## 2017-08-06 ENCOUNTER — Ambulatory Visit
Admission: RE | Admit: 2017-08-06 | Discharge: 2017-08-06 | Disposition: A | Discharge status: Home / Self Care | Payer: Medicare PPO | Source: Ambulatory Visit | Attending: Radiation Oncology | Admitting: Radiation Oncology

## 2017-08-06 DIAGNOSIS — Z79899 Other long term (current) drug therapy: Secondary | ICD-10-CM | POA: Diagnosis not present

## 2017-08-06 DIAGNOSIS — E119 Type 2 diabetes mellitus without complications: Secondary | ICD-10-CM | POA: Diagnosis not present

## 2017-08-06 DIAGNOSIS — H409 Unspecified glaucoma: Secondary | ICD-10-CM | POA: Diagnosis not present

## 2017-08-06 DIAGNOSIS — M1990 Unspecified osteoarthritis, unspecified site: Secondary | ICD-10-CM | POA: Diagnosis not present

## 2017-08-06 DIAGNOSIS — Z170 Estrogen receptor positive status [ER+]: Secondary | ICD-10-CM | POA: Diagnosis not present

## 2017-08-06 DIAGNOSIS — Z510 Encounter for antineoplastic radiation therapy: Secondary | ICD-10-CM | POA: Diagnosis not present

## 2017-08-06 DIAGNOSIS — C50411 Malignant neoplasm of upper-outer quadrant of right female breast: Secondary | ICD-10-CM | POA: Diagnosis not present

## 2017-08-06 DIAGNOSIS — I10 Essential (primary) hypertension: Secondary | ICD-10-CM | POA: Diagnosis not present

## 2017-08-06 DIAGNOSIS — Z7984 Long term (current) use of oral hypoglycemic drugs: Secondary | ICD-10-CM | POA: Diagnosis not present

## 2017-08-09 ENCOUNTER — Ambulatory Visit
Admission: RE | Admit: 2017-08-09 | Discharge: 2017-08-09 | Disposition: A | Discharge status: Home / Self Care | Payer: Medicare PPO | Source: Ambulatory Visit | Attending: Radiation Oncology | Admitting: Radiation Oncology

## 2017-08-09 DIAGNOSIS — E119 Type 2 diabetes mellitus without complications: Secondary | ICD-10-CM | POA: Diagnosis not present

## 2017-08-09 DIAGNOSIS — C50411 Malignant neoplasm of upper-outer quadrant of right female breast: Secondary | ICD-10-CM | POA: Diagnosis not present

## 2017-08-09 DIAGNOSIS — Z170 Estrogen receptor positive status [ER+]: Secondary | ICD-10-CM | POA: Diagnosis not present

## 2017-08-09 DIAGNOSIS — Z79899 Other long term (current) drug therapy: Secondary | ICD-10-CM | POA: Diagnosis not present

## 2017-08-09 DIAGNOSIS — M1990 Unspecified osteoarthritis, unspecified site: Secondary | ICD-10-CM | POA: Diagnosis not present

## 2017-08-09 DIAGNOSIS — Z510 Encounter for antineoplastic radiation therapy: Secondary | ICD-10-CM | POA: Diagnosis not present

## 2017-08-09 DIAGNOSIS — Z7984 Long term (current) use of oral hypoglycemic drugs: Secondary | ICD-10-CM | POA: Diagnosis not present

## 2017-08-09 DIAGNOSIS — H409 Unspecified glaucoma: Secondary | ICD-10-CM | POA: Diagnosis not present

## 2017-08-09 DIAGNOSIS — I10 Essential (primary) hypertension: Secondary | ICD-10-CM | POA: Diagnosis not present

## 2017-08-10 ENCOUNTER — Ambulatory Visit
Admission: RE | Admit: 2017-08-10 | Discharge: 2017-08-10 | Disposition: A | Discharge status: Home / Self Care | Payer: Medicare PPO | Source: Ambulatory Visit | Attending: Radiation Oncology | Admitting: Radiation Oncology

## 2017-08-10 DIAGNOSIS — M1990 Unspecified osteoarthritis, unspecified site: Secondary | ICD-10-CM | POA: Diagnosis not present

## 2017-08-10 DIAGNOSIS — Z79899 Other long term (current) drug therapy: Secondary | ICD-10-CM | POA: Diagnosis not present

## 2017-08-10 DIAGNOSIS — Z510 Encounter for antineoplastic radiation therapy: Secondary | ICD-10-CM | POA: Diagnosis not present

## 2017-08-10 DIAGNOSIS — E119 Type 2 diabetes mellitus without complications: Secondary | ICD-10-CM | POA: Diagnosis not present

## 2017-08-10 DIAGNOSIS — C50411 Malignant neoplasm of upper-outer quadrant of right female breast: Secondary | ICD-10-CM | POA: Diagnosis not present

## 2017-08-10 DIAGNOSIS — Z7984 Long term (current) use of oral hypoglycemic drugs: Secondary | ICD-10-CM | POA: Diagnosis not present

## 2017-08-10 DIAGNOSIS — Z170 Estrogen receptor positive status [ER+]: Secondary | ICD-10-CM | POA: Diagnosis not present

## 2017-08-10 DIAGNOSIS — I10 Essential (primary) hypertension: Secondary | ICD-10-CM | POA: Diagnosis not present

## 2017-08-10 DIAGNOSIS — H409 Unspecified glaucoma: Secondary | ICD-10-CM | POA: Diagnosis not present

## 2017-08-11 ENCOUNTER — Ambulatory Visit
Admission: RE | Admit: 2017-08-11 | Discharge: 2017-08-11 | Disposition: A | Discharge status: Home / Self Care | Payer: Medicare PPO | Source: Ambulatory Visit | Attending: Radiation Oncology | Admitting: Radiation Oncology

## 2017-08-11 DIAGNOSIS — Z510 Encounter for antineoplastic radiation therapy: Secondary | ICD-10-CM | POA: Diagnosis not present

## 2017-08-11 DIAGNOSIS — M1990 Unspecified osteoarthritis, unspecified site: Secondary | ICD-10-CM | POA: Diagnosis not present

## 2017-08-11 DIAGNOSIS — C50411 Malignant neoplasm of upper-outer quadrant of right female breast: Secondary | ICD-10-CM | POA: Diagnosis not present

## 2017-08-11 DIAGNOSIS — Z170 Estrogen receptor positive status [ER+]: Secondary | ICD-10-CM | POA: Diagnosis not present

## 2017-08-11 DIAGNOSIS — H409 Unspecified glaucoma: Secondary | ICD-10-CM | POA: Diagnosis not present

## 2017-08-11 DIAGNOSIS — I10 Essential (primary) hypertension: Secondary | ICD-10-CM | POA: Diagnosis not present

## 2017-08-11 DIAGNOSIS — E119 Type 2 diabetes mellitus without complications: Secondary | ICD-10-CM | POA: Diagnosis not present

## 2017-08-11 DIAGNOSIS — Z79899 Other long term (current) drug therapy: Secondary | ICD-10-CM | POA: Diagnosis not present

## 2017-08-11 DIAGNOSIS — Z7984 Long term (current) use of oral hypoglycemic drugs: Secondary | ICD-10-CM | POA: Diagnosis not present

## 2017-08-12 ENCOUNTER — Ambulatory Visit (HOSPITAL_BASED_OUTPATIENT_CLINIC_OR_DEPARTMENT_OTHER): Payer: Medicare PPO | Admitting: Hematology and Oncology

## 2017-08-12 DIAGNOSIS — C50411 Malignant neoplasm of upper-outer quadrant of right female breast: Secondary | ICD-10-CM | POA: Diagnosis not present

## 2017-08-12 DIAGNOSIS — Z170 Estrogen receptor positive status [ER+]: Secondary | ICD-10-CM

## 2017-08-12 MED ORDER — ANASTROZOLE 1 MG PO TABS: 1.0000 mg | ORAL_TABLET | Freq: Every day | ORAL | 3 refills | Status: DC

## 2017-08-12 NOTE — Progress Notes (Signed)
Patient Care Team: Katherina Mires, MD as PCP - General (Family Medicine)  DIAGNOSIS:  Encounter Diagnosis  Name Primary?  . Malignant neoplasm of upper-outer quadrant of right breast in female, estrogen receptor positive ([place])     SUMMARY OF ONCOLOGIC HISTORY:   Malignant neoplasm of upper-outer quadrant of right breast in female, estrogen receptor positive ([place])   06/03/2017 Initial Diagnosis    Malignant neoplasm of upper-outer quadrant of right breast in female, estrogen receptor positive ([place])      06/14/2017 Surgery    Right lumpectomy: Grade 2 IDC with DCIS, 0.7 cm, margins negative, a ledge, ADH, ER 95%, PR 90%, HER-2 negative ratio 1.21, Ki-67 5%, T1b NX stage I a      07/01/2017 - 08/11/2017 Radiation Therapy    Adjuvant radiation therapy      CHIEF COMPLIANT: Follow-up after radiation therapy  INTERVAL HISTORY: Kristen Hill is a 83 year old with above-mentioned history of right breast cancer who has completed lumpectomy and recently completed adjuvant radiation therapy.  She is here today to discuss the pros and cons of adjuvant antiestrogen therapy.  She had done extremely well from a radiation standpoint with very minimal discomfort.  She does have some sunburn-like reaction to the breast.  REVIEW OF SYSTEMS:   Constitutional: Denies fevers, chills or abnormal weight loss Eyes: Denies blurriness of vision Ears, nose, mouth, throat, and face: Denies mucositis or sore throat Respiratory: Denies cough, dyspnea or wheezes Cardiovascular: Denies palpitation, chest discomfort Gastrointestinal:  Denies nausea, heartburn or change in bowel habits Skin: Denies abnormal skin rashes Lymphatics: Denies new lymphadenopathy or easy bruising Neurological:Denies numbness, tingling or new weaknesses Behavioral/Psych: Mood is stable, no new changes  Extremities: No lower extremity edema Breast: Sunburn-like reaction in the breast All other systems were reviewed with the  patient and are negative.  I have reviewed the past medical history, past surgical history, social history and family history with the patient and they are unchanged from previous note.  ALLERGIES:  is allergic to bee venom and sulfa antibiotics.  MEDICATIONS:  Current Outpatient Medications  Medication Sig Dispense Refill  . amLODipine (NORVASC) 5 MG tablet Take 1 tablet (5 mg total) by mouth daily. 90 tablet 1  . [START ON 09/28/2017] anastrozole (ARIMIDEX) 1 MG tablet Take 1 tablet (1 mg total) daily by mouth. 90 tablet 3  . bimatoprost (LUMIGAN) 0.03 % ophthalmic solution Place 1 drop into the left eye at bedtime.    Marland Kitchen EPINEPHrine 0.3 mg/0.3 mL IJ SOAJ injection Inject 0.3 mg into the skin once as needed. Allergic reaction    . glucose blood (BAYER CONTOUR TEST) test strip Check blood sugars q am and prn 100 each 5  . metFORMIN (GLUCOPHAGE) 500 MG tablet Take 1 tablet (500 mg total) by mouth 2 (two) times daily with a meal. 180 tablet 1  . MICROLET LANCETS MISC Check BS q am and prn 100 each 5  . Multiple Vitamin (MULTIVITAMIN WITH MINERALS) TABS tablet Take 1 tablet by mouth every other day.    . Vitamin D, Ergocalciferol, 2000 units CAPS Take 2,000 Units by mouth every other day.     No current facility-administered medications for this visit.     PHYSICAL EXAMINATION: ECOG PERFORMANCE STATUS: 1 - Symptomatic but completely ambulatory  Vitals:   08/12/17 1142  BP: (!) 141/58  Pulse: 85  Resp: 18  Temp: (!) 97.5 F (36.4 C)  SpO2: 100%   Filed Weights   08/12/17 1142  Weight:  144 lb (65.3 kg)    GENERAL:alert, no distress and comfortable SKIN: skin color, texture, turgor are normal, no rashes or significant lesions EYES: normal, Conjunctiva are pink and non-injected, sclera clear OROPHARYNX:no exudate, no erythema and lips, buccal mucosa, and tongue normal  NECK: supple, thyroid normal size, non-tender, without nodularity LYMPH:  no palpable lymphadenopathy in the  cervical, axillary or inguinal LUNGS: clear to auscultation and percussion with normal breathing effort HEART: regular rate & rhythm and no murmurs and no lower extremity edema ABDOMEN:abdomen soft, non-tender and normal bowel sounds MUSCULOSKELETAL:no cyanosis of digits and no clubbing  NEURO: alert & oriented x 3 with fluent speech, no focal motor/sensory deficits EXTREMITIES: No lower extremity edema  LABORATORY DATA:  I have reviewed the data as listed   Chemistry      Component Value Date/Time   NA 131 (L) 06/09/2017 1217   K 4.1 06/09/2017 1217   CL 100 (L) 06/09/2017 1217   CO2 23 06/09/2017 1217   BUN 16 06/09/2017 1217   CREATININE 0.94 06/09/2017 1217   CREATININE 1.04 10/25/2013 1216      Component Value Date/Time   CALCIUM 9.3 06/09/2017 1217   ALKPHOS 60 10/25/2013 1216   AST 18 10/25/2013 1216   ALT 14 10/25/2013 1216   BILITOT 0.8 10/25/2013 1216       Lab Results  Component Value Date   WBC 7.7 06/09/2017   HGB 12.5 06/09/2017   HCT 37.5 06/09/2017   MCV 91.7 06/09/2017   PLT 222 06/09/2017    ASSESSMENT & PLAN:  Malignant neoplasm of upper-outer quadrant of right breast in female, estrogen receptor positive ([place]) 06/14/2017: Right lumpectomy: Grade 2 IDC with DCIS, 0.7 cm, margins negative, a ledge, ADH, ER 95%, PR 90%, HER-2 negative ratio 1.21, Ki-67 5%, T1b NX stage I a  Pathology counseling: I discussed the final pathology report of the patient provided  a copy of this report. I discussed the margins. We also discussed the final staging along with previously performed ER/PR and HER-2/neu testing.  Recommendation: 1. Adjuvant radiation 07/01/2017-08/11/2017 2. adjuvant antiestrogen therapy with anastrozole 1 mg daily, will start 09/28/2017  Anastrozole counseling: We discussed the risks and benefits of anti-estrogen therapy with aromatase inhibitors. These include but not limited to insomnia, hot flashes, mood changes, vaginal dryness, bone density  loss, and weight gain. We strongly believe that the benefits far outweigh the risks. Patient understands these risks and consented to starting treatment. Planned treatment duration is 5 years.  Patient is willing to take antiestrogen therapy.  She would like to start it January 2019. Return to clinic in 4 months for survivorship care plan visit     I spent 25 minutes talking to the patient of which more than half was spent in counseling and coordination of care.  No orders of the defined types were placed in this encounter.  The patient has a good understanding of the overall plan. she agrees with it. she will call with any problems that may develop before the next visit here.   Rulon Eisenmenger, MD 08/12/17

## 2017-08-12 NOTE — Assessment & Plan Note (Signed)
06/14/2017: Right lumpectomy: Grade 2 IDC with DCIS, 0.7 cm, margins negative, a ledge, ADH, ER 95%, PR 90%, HER-2 negative ratio 1.21, Ki-67 5%, T1b NX stage I a  Pathology counseling: I discussed the final pathology report of the patient provided  a copy of this report. I discussed the margins. We also discussed the final staging along with previously performed ER/PR and HER-2/neu testing.  Recommendation: 1. Adjuvant radiation 07/01/2017-08/11/2017 2. adjuvant antiestrogen therapy with anastrozole 1 mg daily, will start 09/28/2017  Anastrozole counseling: We discussed the risks and benefits of anti-estrogen therapy with aromatase inhibitors. These include but not limited to insomnia, hot flashes, mood changes, vaginal dryness, bone density loss, and weight gain. We strongly believe that the benefits far outweigh the risks. Patient understands these risks and consented to starting treatment. Planned treatment duration is 5 years.   Return to clinic in 4 months for survivorship care plan visit

## 2017-08-13 ENCOUNTER — Encounter: Payer: Self-pay | Admitting: Radiation Oncology

## 2017-08-13 NOTE — Progress Notes (Signed)
  Radiation Oncology         [phone]) [phone] ________________________________  Name: Kristen Hill MRN: [id number]  Date: 08/13/2017  DOB: Jan 30, 1926  End of Treatment Note  Diagnosis:   Stage IA Right Breast UOQ Invasive Ductal Carcinoma, ER(+) / PR(+) / Her2(-), Grade 2, Pathologic Stage pT1b, pNX, pMX     Indication for treatment:  Curative      Radiation treatment dates:   07/21/2017 - 08/11/2017  Site/dose:   Breast_Rt+Axilla/ 2.66 Gy X 16 fractions  Beams/energy:   3D High Tangents/ 6X, 10X  Narrative: The patient tolerated radiation treatment relatively well. Throughout treatment the patient used Radiaplex as prescribed, but by the end she noticed mild erythema to her right breast. She did not experience any pain or fatigue during treatment.   Plan: The patient has completed radiation treatment. The patient will return to radiation oncology clinic for routine followup in one month. I advised them to call or return sooner if they have any questions or concerns related to their recovery or treatment.  -----------------------------------  Eppie Gibson, MD  This document serves as a record of services personally performed by Eppie Gibson, MD. It was created on her behalf by Margit Banda, a trained medical scribe. The creation of this record is based on the scribe's personal observations and the provider's statements to them. This document has been checked and approved by the attending provider.

## 2017-08-20 ENCOUNTER — Telehealth: Payer: Self-pay | Admitting: Hematology and Oncology

## 2017-08-20 NOTE — Telephone Encounter (Signed)
Scheduled appt per 11/15 sch msg. Left voicemail for patient regarding the appt

## 2017-08-25 ENCOUNTER — Telehealth: Payer: Self-pay

## 2017-08-25 NOTE — Telephone Encounter (Signed)
RN returned call to patient to discuss message left on nurse's voicemail. Patient expressed questioning regarding "an appointment coming up in March". Patient states "someone left me a voicemail last week but it wasn't very clear. I was calling to see what that was about." RN informed patient of Dr. Geralyn Flash recommendation for patient to return to clinic in 4 months. Patient states "well I still haven't decided if I'm going to take that pill yet. Is there a co-pay with it?" RN discussed visit would be billed as visits with Dr. Nadara Eaton have been. Patient verbalized understanding and denies questions at this time. States she will call back to schedule appointment at later date if she decides to.

## 2017-09-08 ENCOUNTER — Encounter: Payer: Self-pay | Admitting: Radiation Oncology

## 2017-09-10 ENCOUNTER — Ambulatory Visit: Admission: RE | Admit: 2017-09-10 | Payer: Medicare PPO | Source: Ambulatory Visit | Admitting: Radiation Oncology

## 2017-09-10 HISTORY — DX: Personal history of irradiation: Z92.3

## 2017-09-14 ENCOUNTER — Ambulatory Visit
Admission: RE | Admit: 2017-09-14 | Discharge: 2017-09-14 | Disposition: A | Discharge status: Home / Self Care | Payer: Medicare PPO | Source: Ambulatory Visit | Attending: Radiation Oncology | Admitting: Radiation Oncology

## 2017-09-14 ENCOUNTER — Encounter: Payer: Self-pay | Admitting: Radiation Oncology

## 2017-09-14 VITALS — BP 143/60 | HR 77 | Temp 97.7°F | Wt 144.8 lb

## 2017-09-14 DIAGNOSIS — Z882 Allergy status to sulfonamides status: Secondary | ICD-10-CM | POA: Diagnosis not present

## 2017-09-14 DIAGNOSIS — Z79899 Other long term (current) drug therapy: Secondary | ICD-10-CM | POA: Insufficient documentation

## 2017-09-14 DIAGNOSIS — Z7984 Long term (current) use of oral hypoglycemic drugs: Secondary | ICD-10-CM | POA: Insufficient documentation

## 2017-09-14 DIAGNOSIS — C50411 Malignant neoplasm of upper-outer quadrant of right female breast: Secondary | ICD-10-CM | POA: Diagnosis not present

## 2017-09-14 DIAGNOSIS — Z923 Personal history of irradiation: Secondary | ICD-10-CM | POA: Insufficient documentation

## 2017-09-14 DIAGNOSIS — Z91030 Bee allergy status: Secondary | ICD-10-CM | POA: Insufficient documentation

## 2017-09-14 DIAGNOSIS — Z170 Estrogen receptor positive status [ER+]: Secondary | ICD-10-CM | POA: Diagnosis not present

## 2017-09-14 NOTE — Progress Notes (Signed)
Ms. Hal Morales presents for follow up of radiation completed 08/11/17 to her Right Breast. She denies pain or fatigue. She has some mild hyperpigmentation to the upper outer quadrant of her right breast. She continues to use radiaplex to her Right Breast daily. She continues to decide if she is going to take Anastrozole, but is leaning against it.   BP (!) 143/60   Pulse 77   Temp 97.7 F (36.5 C)   Wt 144 lb 12.8 oz (65.7 kg)   SpO2 100% Comment: room air  BMI 27.36 kg/m    Wt Readings from Last 3 Encounters:  09/14/17 144 lb 12.8 oz (65.7 kg)  08/12/17 144 lb (65.3 kg)  06/29/17 145 lb (65.8 kg)

## 2017-09-14 NOTE — Progress Notes (Signed)
  Radiation Oncology         [phone] ________________________________  Name: Kristen Hill MRN: [id number]  Date: 09/14/2017  DOB: June 06, 1926  Follow-Up Visit Note  Outpatient  CC: Katherina Mires, MD  Autumn Messing III, MD  Diagnosis and Prior Radiotherapy:    ICD-10-CM   1. Malignant neoplasm of upper-outer quadrant of right breast in female, estrogen receptor positive [facility]) C50.411    Z17.0    Right breast cancer Radiation completed 08/11/17 to right breast  CHIEF COMPLAINT: Here for follow-up and surveillance of her right breast cancer  Narrative:  The patient returns today for routine follow-up.  She states she leaning toward not taking anastrozole. She is worried this could exacerbate preexisting balance problem. She continues to endorse Radiaplex daily.                      ALLERGIES:  is allergic to bee venom and sulfa antibiotics.  Meds: Current Outpatient Medications  Medication Sig Dispense Refill  . amLODipine (NORVASC) 5 MG tablet Take 1 tablet (5 mg total) by mouth daily. 90 tablet 1  . bimatoprost (LUMIGAN) 0.03 % ophthalmic solution Place 1 drop into the left eye at bedtime.    Marland Kitchen EPINEPHrine 0.3 mg/0.3 mL IJ SOAJ injection Inject 0.3 mg into the skin once as needed. Allergic reaction    . glucose blood (BAYER CONTOUR TEST) test strip Check blood sugars q am and prn 100 each 5  . metFORMIN (GLUCOPHAGE) 500 MG tablet Take 1 tablet (500 mg total) by mouth 2 (two) times daily with a meal. 180 tablet 1  . MICROLET LANCETS MISC Check BS q am and prn 100 each 5  . Multiple Vitamin (MULTIVITAMIN WITH MINERALS) TABS tablet Take 1 tablet by mouth every other day.    . Vitamin D, Ergocalciferol, 2000 units CAPS Take 2,000 Units by mouth every other day.    Derrill Memo ON 09/28/2017] anastrozole (ARIMIDEX) 1 MG tablet Take 1 tablet (1 mg total) daily by mouth. (Patient not taking: Reported on 09/14/2017) 90 tablet 3   No current facility-administered medications for  this encounter.     Physical Findings: The patient is in no acute distress. Patient is alert and oriented. Faint residual hyperpigmentation over the upper outer right breast. Skin is intact. She has healed well.  weight is 144 lb 12.8 oz (65.7 kg). Her temperature is 97.7 F (36.5 C). Her blood pressure is 143/60 (abnormal) and her pulse is 77. Her oxygen saturation is 100%. .      Lab Findings: Lab Results  Component Value Date   WBC 7.7 06/09/2017   HGB 12.5 06/09/2017   HCT 37.5 06/09/2017   MCV 91.7 06/09/2017   PLT 222 06/09/2017    Radiographic Findings: No results found.  Impression/Plan: healing well   I encouraged her to continue with yearly mammography and followup with medical oncology. I will see her back on an as-needed basis. I have encouraged her to call if she has any issues or concerns in the future. I wished her the very best.    _____________________________________   Eppie Gibson, MD  This document serves as a record of services personally performed by Eppie Gibson, MD. It was created on his behalf by Linward Natal, a trained medical scribe. The creation of this record is based on the scribe's personal observations and the provider's statements to them. This document has been checked and approved by the attending provider.

## 2017-09-15 ENCOUNTER — Encounter: Payer: Self-pay | Admitting: Radiation Oncology

## 2017-10-14 DIAGNOSIS — C50411 Malignant neoplasm of upper-outer quadrant of right female breast: Secondary | ICD-10-CM | POA: Diagnosis not present

## 2017-10-18 DIAGNOSIS — E782 Mixed hyperlipidemia: Secondary | ICD-10-CM | POA: Diagnosis not present

## 2017-10-18 DIAGNOSIS — I10 Essential (primary) hypertension: Secondary | ICD-10-CM | POA: Diagnosis not present

## 2017-10-18 DIAGNOSIS — Z8589 Personal history of malignant neoplasm of other organs and systems: Secondary | ICD-10-CM | POA: Diagnosis not present

## 2017-10-18 DIAGNOSIS — E119 Type 2 diabetes mellitus without complications: Secondary | ICD-10-CM | POA: Diagnosis not present

## 2017-10-18 DIAGNOSIS — E559 Vitamin D deficiency, unspecified: Secondary | ICD-10-CM | POA: Diagnosis not present

## 2017-10-18 DIAGNOSIS — E1165 Type 2 diabetes mellitus with hyperglycemia: Secondary | ICD-10-CM | POA: Diagnosis not present

## 2017-10-18 DIAGNOSIS — Z6829 Body mass index (BMI) 29.0-29.9, adult: Secondary | ICD-10-CM | POA: Diagnosis not present

## 2017-10-18 DIAGNOSIS — Z0001 Encounter for general adult medical examination with abnormal findings: Secondary | ICD-10-CM | POA: Diagnosis not present

## 2017-10-25 ENCOUNTER — Telehealth: Payer: Self-pay | Admitting: *Deleted

## 2017-10-25 NOTE — Telephone Encounter (Signed)
Called pt to see if she has started AI. Pt relate she has not. Discussed with pt that Dr. Lindi Adie would prefer if she did Tamoxifen or Anastrozole. Pt relate she has decided not to take any anti-estrongen oral therapy at this time. Physician team notified.

## 2017-11-22 DIAGNOSIS — Z1211 Encounter for screening for malignant neoplasm of colon: Secondary | ICD-10-CM | POA: Diagnosis not present

## 2017-12-09 ENCOUNTER — Telehealth: Payer: Self-pay | Admitting: Adult Health

## 2017-12-09 NOTE — Telephone Encounter (Signed)
Patient called to cancel °

## 2017-12-13 ENCOUNTER — Encounter: Payer: Medicare PPO | Admitting: Adult Health

## 2017-12-28 DIAGNOSIS — M5441 Lumbago with sciatica, right side: Secondary | ICD-10-CM | POA: Diagnosis not present

## 2018-01-25 ENCOUNTER — Ambulatory Visit: Payer: Medicare PPO | Attending: Orthopedic Surgery | Admitting: Physical Therapy

## 2018-01-25 ENCOUNTER — Other Ambulatory Visit: Payer: Self-pay

## 2018-01-25 ENCOUNTER — Encounter: Payer: Self-pay | Admitting: Physical Therapy

## 2018-01-25 DIAGNOSIS — R262 Difficulty in walking, not elsewhere classified: Secondary | ICD-10-CM | POA: Insufficient documentation

## 2018-01-25 DIAGNOSIS — M5442 Lumbago with sciatica, left side: Secondary | ICD-10-CM | POA: Insufficient documentation

## 2018-01-25 NOTE — Therapy (Signed)
[facility] [place], [place], [address] Phone: [phone]   Fax:  [phone]  Physical Therapy Evaluation  Patient Details  Name: Kristen Hill MRN: [id number] Date of Birth: 1925/10/04 Referring Provider: Dorna Leitz   Encounter Date: 01/25/2018  PT End of Session - 01/25/18 1532    Visit Number  1    Date for PT Re-Evaluation  03/27/18    PT Start Time  1445    PT Stop Time  1530    PT Time Calculation (min)  45 min    Activity Tolerance  Patient tolerated treatment well    Behavior During Therapy  [facility] for tasks assessed/performed       Past Medical History:  Diagnosis Date  . Arthritis    right knee, torn meniscus lateral and medial  . Cancer ([place])    right breast  . Diabetes mellitus without complication ([place]) 9476   type 2  . Glaucoma    left eye  . Hematuria 10/31/2013  . History of external beam radiation therapy 07/21/17- 08/11/17   Right Breast 2.66 Gy X 16 fractions  . Hypertension   . Jaundice    in 1947  . Measles as a child  . Mumps as a child  . Other and unspecified hyperlipidemia 07/31/2013  . Physical exam, annual 08/02/2013   Sees Dr Trinna Post at [facility] Dr Farris Has, Retinal specialist Dermatolgy  . Thyroid nodule   . Wears glasses   . Wears partial dentures     Past Surgical History:  Procedure Laterality Date  . BREAST LUMPECTOMY WITH RADIOACTIVE SEED LOCALIZATION Right 06/14/2017   Procedure: [place] BREAST LUMPECTOMY WITH RADIOACTIVE SEED LOCALIZATION ERAS PATHWAY;  Surgeon: Jovita Kussmaul, MD;  Location: [facility];  Service: General;  Laterality: Right;  . EYE SURGERY  2009 and 2010   cataract both eyes  . EYE SURGERY  2012   laser to left eye, chalazion was removed with laser  . KNEE SURGERY Right    medial meniscus and lateral menicus  . MULTIPLE TOOTH EXTRACTIONS    . TONSILLECTOMY  84 yrs old    There were no vitals filed for this  visit.   Subjective Assessment - 01/25/18 1448    Subjective  Patient reports left hip and low back pain for about a year.  She is unsure of a cause, reports every morning she has excruciating pain.  X-rays show DDD.  Hip looked good.  Reports that after about 15 minutes the pain goes away    Patient Stated Goals  have less pain in the AM    Currently in Pain?  No/denies    Pain Score  0-No pain    Pain Location  Hip    Pain Orientation  Left    Pain Descriptors / Indicators  Aching    Pain Type  Chronic pain    Pain Onset  More than a month ago    Pain Frequency  Intermittent    Aggravating Factors   First thing in the AM pain a 7/10 n    Pain Relieving Factors  after 10-15 minutes of pain it will go away to 0/10 until the next AM    Effect of Pain on Daily Activities  "just bad morning"         [facility] PT Assessment - 01/25/18 0001      Assessment   Medical Diagnosis  Lumbar DDD, left hip pain  Referring Provider  Dorna Leitz    Onset Date/Surgical Date  12/28/17    Prior Therapy  no      Precautions   Precautions  None      Balance Screen   Has the patient fallen in the past 6 months  No    Has the patient had a decrease in activity level because of a fear of falling?   No    Is the patient reluctant to leave their home because of a fear of falling?   No      Home Environment   Additional Comments  2 steps into the home, does housework, some yardwork      Prior Function   Level of Independence  Independent    Vocation  Retired    Leisure  treadmill and bike at [employer].      Posture/Postural Control   Posture Comments  fwd head, rounded shoulders      ROM / Strength   AROM / PROM / Strength  AROM;Strength      AROM   Overall AROM Comments  Lumbar ROM is decreased 25% for all motions      Strength   Overall Strength Comments  hips 4-/5, knees 4+/5      Flexibility   Soft Tissue Assessment /Muscle Length  yes    Hamstrings  mild tightness    Piriformis   very tight especially left, very tender      Palpation   Palpation comment  she is very tender in the left piriformis       Ambulation/Gait   Gait Comments  no device, slight antalgic gait on the right, reports "bone on bone" on the right, small shuffling steps, slightly forward flexed trunk, again difficulty straightening up      Standardized Balance Assessment   Standardized Balance Assessment  Timed Up and Go Test      Timed Up and Go Test   Normal TUG (seconds)  20 difficulty with straightening up                Objective measurements completed on examination: See above findings.                PT Short Term Goals - 01/25/18 1535      PT SHORT TERM GOAL #1   Title  independent with intiial HEP    Time  2    Period  Weeks    Status  New        PT Long Term Goals - 01/25/18 1535      PT LONG TERM GOAL #1   Title  decrease pain in theAM 50%    Time  8    Period  Weeks    Status  New      PT LONG TERM GOAL #2   Title  understand proper posture and body mechanics    Time  8    Period  Weeks    Status  New      PT LONG TERM GOAL #3   Title  decrease TUG test to 15 seconds    Time  8    Period  Weeks    Status  New             Plan - 01/25/18 1532    Clinical Impression Statement  Patient reports about a year ago she started having left hip pain only in the AM.  The pain goes away after 15 minutes.  She has DDD of the lumbar spine.  She has tightness and tenderness in the left piriformis mms.  She has difficulty standing when getting up from sitting, her first few steps she tends to creep with furniture and is stooped fwd.    Clinical Presentation  Stable    Clinical Decision Making  Low    Rehab Potential  Good    PT Frequency  2x / week    PT Duration  8 weeks    PT Treatment/Interventions  ADLs/Self Care Home Management;Electrical Stimulation;Moist Heat;Therapeutic exercise;Therapeutic activities;Functional mobility  training;Patient/family education;Manual techniques    PT Next Visit Plan  slowly start stregnth and flexibility    Consulted and Agree with Plan of Care  Patient       Patient will benefit from skilled therapeutic intervention in order to improve the following deficits and impairments:  Abnormal gait, Decreased range of motion, Difficulty walking, Increased muscle spasms, Pain, Impaired flexibility  Visit Diagnosis: Acute left-sided low back pain with left-sided sciatica - Plan: PT plan of care cert/re-cert  Difficulty in walking, not elsewhere classified - Plan: PT plan of care cert/re-cert     Problem List Patient Active Problem List   Diagnosis Date Noted  . Malignant neoplasm of upper-outer quadrant of right breast in female, estrogen receptor positive ([place]) 06/29/2017  . Unspecified vitamin D deficiency 11/27/2013  . Hematuria 10/31/2013  . Physical exam, annual 08/02/2013  . Other and unspecified hyperlipidemia 07/31/2013  . Diabetes mellitus without complication ([place])   . Hypertension   . Arthritis     Sumner Boast., PT 01/25/2018, 3:38 PM  [facility] [place], [place], [address] Phone: [phone]   Fax:  [phone]  Name: Kristen Hill MRN: [id number] Date of Birth: [birth year]

## 2018-02-02 ENCOUNTER — Ambulatory Visit: Payer: Medicare PPO | Attending: Orthopedic Surgery | Admitting: Physical Therapy

## 2018-02-02 ENCOUNTER — Encounter: Payer: Self-pay | Admitting: Physical Therapy

## 2018-02-02 DIAGNOSIS — R262 Difficulty in walking, not elsewhere classified: Secondary | ICD-10-CM | POA: Diagnosis not present

## 2018-02-02 DIAGNOSIS — M5442 Lumbago with sciatica, left side: Secondary | ICD-10-CM | POA: Diagnosis not present

## 2018-02-02 NOTE — Therapy (Signed)
[place], [place], [address] Phone: [phone]   Fax:  [phone]  Physical Therapy Treatment  Patient Details  Name: Kristen Hill MRN: [id number] Date of Birth: 1926/03/11 Referring Provider: Dorna Leitz   Encounter Date: 02/02/2018  PT End of Session - 02/02/18 1429    Visit Number  2    Date for PT Re-Evaluation  03/27/18    PT Start Time  1345    PT Stop Time  1429    PT Time Calculation (min)  44 min    Activity Tolerance  Patient tolerated treatment well    Behavior During Therapy  [facility] for tasks assessed/performed       Past Medical History:  Diagnosis Date  . Arthritis    right knee, torn meniscus lateral and medial  . Cancer ([place])    right breast  . Diabetes mellitus without complication ([place]) 4097   type 2  . Glaucoma    left eye  . Hematuria 10/31/2013  . History of external beam radiation therapy 07/21/17- 08/11/17   Right Breast 2.66 Gy X 16 fractions  . Hypertension   . Jaundice    in 1947  . Measles as a child  . Mumps as a child  . Other and unspecified hyperlipidemia 07/31/2013  . Physical exam, annual 08/02/2013   Sees Dr Trinna Post at [facility] Dr Farris Has, Retinal specialist Dermatolgy  . Thyroid nodule   . Wears glasses   . Wears partial dentures     Past Surgical History:  Procedure Laterality Date  . BREAST LUMPECTOMY WITH RADIOACTIVE SEED LOCALIZATION Right 06/14/2017   Procedure: [place] BREAST LUMPECTOMY WITH RADIOACTIVE SEED LOCALIZATION ERAS PATHWAY;  Surgeon: Jovita Kussmaul, MD;  Location: [place];  Service: General;  Laterality: Right;  . EYE SURGERY  2009 and 2010   cataract both eyes  . EYE SURGERY  2012   laser to left eye, chalazion was removed with laser  . KNEE SURGERY Right    medial meniscus and lateral menicus  . MULTIPLE TOOTH EXTRACTIONS    . TONSILLECTOMY  84 yrs old    There were no vitals filed for this  visit.  Subjective Assessment - 02/02/18 1349    Subjective  "My pain is only in the morning when I wake up"    Currently in Pain?  No/denies    Pain Score  0-No pain                       [facility] Adult PT Treatment/Exercise - 02/02/18 0001      High Level Balance   High Level Balance Activities  Backward walking;Side stepping      Exercises   Exercises  Lumbar;Knee/Hip      Lumbar Exercises: Seated   Other Seated Lumbar Exercises  Seated rows red 2x15       Knee/Hip Exercises: Aerobic   Nustep  L2 x6 min      Knee/Hip Exercises: Seated   Long Arc Quad  2 sets;10 reps;Both;5 reps    Ball Squeeze  x10    Hamstring Curl  2 sets;Both;10 reps    Abduction/Adduction   1 set;Both;10 reps manual resistance     Sit to Sand  2 sets;10 reps 3 reps                PT Short Term Goals - 01/25/18 1535      PT SHORT  TERM GOAL #1   Title  independent with intiial HEP    Time  2    Period  Weeks    Status  New        PT Long Term Goals - 01/25/18 1535      PT LONG TERM GOAL #1   Title  decrease pain in theAM 50%    Time  8    Period  Weeks    Status  New      PT LONG TERM GOAL #2   Title  understand proper posture and body mechanics    Time  8    Period  Weeks    Status  New      PT LONG TERM GOAL #3   Title  decrease TUG test to 15 seconds    Time  8    Period  Weeks    Status  New            Plan - 02/02/18 1430    Clinical Impression Statement  Pt tolerated an initial progression to exercise well. She reports hip pain only in the morning that goes away after she gets moving around. Cues provided to get pt to lean forward with sit to stands to prevent pt's knee from pushing against mat table when standing.  Pt stated that she has been having some R knee pain occasionally.     Rehab Potential  Good    PT Frequency  2x / week    PT Duration  8 weeks    PT Treatment/Interventions  ADLs/Self Care Home Management;Electrical Stimulation;Moist  Heat;Therapeutic exercise;Therapeutic activities;Functional mobility training;Patient/family education;Manual techniques    PT Next Visit Plan  slowly start strength and flexibility       Patient will benefit from skilled therapeutic intervention in order to improve the following deficits and impairments:  Abnormal gait, Decreased range of motion, Difficulty walking, Increased muscle spasms, Pain, Impaired flexibility  Visit Diagnosis: Difficulty in walking, not elsewhere classified  Acute left-sided low back pain with left-sided sciatica     Problem List Patient Active Problem List   Diagnosis Date Noted  . Malignant neoplasm of upper-outer quadrant of right breast in female, estrogen receptor positive ([place]) 06/29/2017  . Unspecified vitamin D deficiency 11/27/2013  . Hematuria 10/31/2013  . Physical exam, annual 08/02/2013  . Other and unspecified hyperlipidemia 07/31/2013  . Diabetes mellitus without complication ([place])   . Hypertension   . Arthritis     Scot Jun, PTA 02/02/2018, 2:33 PM  [place], [place], [address] Phone: [phone]   Fax:  [phone]  Name: Kristen Hill MRN: [id number] Date of Birth: 27-Aug-1926

## 2018-02-08 ENCOUNTER — Encounter: Payer: Self-pay | Admitting: Physical Therapy

## 2018-02-08 ENCOUNTER — Ambulatory Visit: Payer: Medicare PPO | Admitting: Physical Therapy

## 2018-02-08 DIAGNOSIS — R262 Difficulty in walking, not elsewhere classified: Secondary | ICD-10-CM | POA: Diagnosis not present

## 2018-02-08 DIAGNOSIS — M5442 Lumbago with sciatica, left side: Secondary | ICD-10-CM

## 2018-02-08 NOTE — Therapy (Signed)
[place], [place], [address] Phone: [phone]   Fax:  [phone]  Physical Therapy Treatment  Patient Details  Name: Kristen Hill MRN: [id number] Date of Birth: 1925/10/10 Referring Provider: Dorna Leitz   Encounter Date: 02/08/2018  PT End of Session - 02/08/18 1432    Visit Number  3    Date for PT Re-Evaluation  03/27/18    PT Start Time  1345    PT Stop Time  1426    PT Time Calculation (min)  41 min    Activity Tolerance  Patient tolerated treatment well    Behavior During Therapy  [facility] for tasks assessed/performed       Past Medical History:  Diagnosis Date  . Arthritis    right knee, torn meniscus lateral and medial  . Cancer ([place])    right breast  . Diabetes mellitus without complication ([place]) 6270   type 2  . Glaucoma    left eye  . Hematuria 10/31/2013  . History of external beam radiation therapy 07/21/17- 08/11/17   Right Breast 2.66 Gy X 16 fractions  . Hypertension   . Jaundice    in 1947  . Measles as a child  . Mumps as a child  . Other and unspecified hyperlipidemia 07/31/2013  . Physical exam, annual 08/02/2013   Sees Dr Trinna Post at [facility] Dr Farris Has, Retinal specialist Dermatolgy  . Thyroid nodule   . Wears glasses   . Wears partial dentures     Past Surgical History:  Procedure Laterality Date  . BREAST LUMPECTOMY WITH RADIOACTIVE SEED LOCALIZATION Right 06/14/2017   Procedure: [place] BREAST LUMPECTOMY WITH RADIOACTIVE SEED LOCALIZATION ERAS PATHWAY;  Surgeon: Jovita Kussmaul, MD;  Location: [place];  Service: General;  Laterality: Right;  . EYE SURGERY  2009 and 2010   cataract both eyes  . EYE SURGERY  2012   laser to left eye, chalazion was removed with laser  . KNEE SURGERY Right    medial meniscus and lateral menicus  . MULTIPLE TOOTH EXTRACTIONS    . TONSILLECTOMY  84 yrs old    There were no vitals filed for this  visit.  Subjective Assessment - 02/08/18 1345    Subjective  Pt reports that she is doing fine, only have pain in the morning L hip    Currently in Pain?  No/denies    Pain Score  0-No pain                       [facility] Adult PT Treatment/Exercise - 02/08/18 0001      High Level Balance   High Level Balance Activities  Backward walking;Side stepping;Marching forwards    High Level Balance Comments  LOB X1 with side steps; Side step over fhalf foam roll       Knee/Hip Exercises: Aerobic   Nustep  L4 x6 min      Knee/Hip Exercises: Standing   Other Standing Knee Exercises  alt 4in box taps 2x10      Knee/Hip Exercises: Seated   Long Arc Quad  2 sets;10 reps;Both;5 reps;Weights    Long Arc Quad Weight  2 lbs.    Ball Squeeze  x15 3sec hold    Hamstring Curl  2 sets;Both;15 reps;10 reps    Hamstring Limitations  red    Abduction/Adduction   Both;10 reps;2 sets manual resistance     Sit to [employer]  2 sets;10 reps;5 reps;without UE support               PT Short Term Goals - 02/08/18 1349      PT SHORT TERM GOAL #1   Title  independent with intiial HEP    Status  Achieved        PT Long Term Goals - 01/25/18 1535      PT LONG TERM GOAL #1   Title  decrease pain in theAM 50%    Time  8    Period  Weeks    Status  New      PT LONG TERM GOAL #2   Title  understand proper posture and body mechanics    Time  8    Period  Weeks    Status  New      PT LONG TERM GOAL #3   Title  decrease TUG test to 15 seconds    Time  8    Period  Weeks    Status  New            Plan - 02/08/18 1432    Clinical Impression Statement  Pt with some LOB X1 with side steps requiring min assist to correct. Continues to reports pain in the AM when she gets up. All other interventions tolerated well. She does lack full TKE on RLE with LAQ. No R knee pain reported. She does report some fatigue after exercises.    Rehab Potential  Good    PT Frequency  2x / week     PT Duration  8 weeks    PT Treatment/Interventions  ADLs/Self Care Home Management;Electrical Stimulation;Moist Heat;Therapeutic exercise;Therapeutic activities;Functional mobility training;Patient/family education;Manual techniques    PT Next Visit Plan  slowly start strength and flexibility       Patient will benefit from skilled therapeutic intervention in order to improve the following deficits and impairments:  Abnormal gait, Decreased range of motion, Difficulty walking, Increased muscle spasms, Pain, Impaired flexibility  Visit Diagnosis: Difficulty in walking, not elsewhere classified  Acute left-sided low back pain with left-sided sciatica     Problem List Patient Active Problem List   Diagnosis Date Noted  . Malignant neoplasm of upper-outer quadrant of right breast in female, estrogen receptor positive ([place]) 06/29/2017  . Unspecified vitamin D deficiency 11/27/2013  . Hematuria 10/31/2013  . Physical exam, annual 08/02/2013  . Other and unspecified hyperlipidemia 07/31/2013  . Diabetes mellitus without complication ([place])   . Hypertension   . Arthritis     Scot Jun, PTA 02/08/2018, 2:35 PM  [place], [place], [address] Phone: [phone]   Fax:  [phone]  Name: Kristen Hill MRN: [id number] Date of Birth: [birth year]

## 2018-02-10 ENCOUNTER — Ambulatory Visit: Payer: Medicare PPO | Admitting: Physical Therapy

## 2018-02-10 ENCOUNTER — Encounter: Payer: Self-pay | Admitting: Physical Therapy

## 2018-02-10 DIAGNOSIS — M5442 Lumbago with sciatica, left side: Secondary | ICD-10-CM

## 2018-02-10 DIAGNOSIS — R262 Difficulty in walking, not elsewhere classified: Secondary | ICD-10-CM

## 2018-02-10 NOTE — Therapy (Signed)
[place], [place], [address] Phone: [phone]   Fax:  [phone]  Physical Therapy Treatment  Patient Details  Name: Kristen Hill MRN: [id number] Date of Birth: [birth year] Referring Provider: Dorna Leitz   Encounter Date: 02/10/2018  PT End of Session - 02/10/18 1417    Visit Number  4    Date for PT Re-Evaluation  03/27/18    PT Start Time  1448    PT Stop Time  1417    PT Time Calculation (min)  39 min    Activity Tolerance  Patient tolerated treatment well    Behavior During Therapy  [facility] for tasks assessed/performed       Past Medical History:  Diagnosis Date  . Arthritis    right knee, torn meniscus lateral and medial  . Cancer ([place])    right breast  . Diabetes mellitus without complication ([place]) 1856   type 2  . Glaucoma    left eye  . Hematuria 10/31/2013  . History of external beam radiation therapy 07/21/17- 08/11/17   Right Breast 2.66 Gy X 16 fractions  . Hypertension   . Jaundice    in 1947  . Measles as a child  . Mumps as a child  . Other and unspecified hyperlipidemia 07/31/2013  . Physical exam, annual 08/02/2013   Sees Dr Trinna Post at [facility] Dr Farris Has, Retinal specialist Dermatolgy  . Thyroid nodule   . Wears glasses   . Wears partial dentures     Past Surgical History:  Procedure Laterality Date  . BREAST LUMPECTOMY WITH RADIOACTIVE SEED LOCALIZATION Right 06/14/2017   Procedure: [place] BREAST LUMPECTOMY WITH RADIOACTIVE SEED LOCALIZATION ERAS PATHWAY;  Surgeon: Jovita Kussmaul, MD;  Location: [place];  Service: General;  Laterality: Right;  . EYE SURGERY  2009 and 2010   cataract both eyes  . EYE SURGERY  2012   laser to left eye, chalazion was removed with laser  . KNEE SURGERY Right    medial meniscus and lateral menicus  . MULTIPLE TOOTH EXTRACTIONS    . TONSILLECTOMY  84 yrs old    There were no vitals filed for this  visit.  Subjective Assessment - 02/10/18 1343    Subjective  Pt reports having more pain this morning compared to others when you got up, but it did go away once she gets moving.     Currently in Pain?  No/denies    Pain Score  0-No pain                       [facility] Adult PT Treatment/Exercise - 02/10/18 0001      Knee/Hip Exercises: Aerobic   Recumbent Bike  L0 x 46min       Knee/Hip Exercises: Standing   Hip Abduction  2 sets;Both;10 reps    Walking with Sports Cord  20lb side step x5 each    Other Standing Knee Exercises  standing march 2x10    Other Standing Knee Exercises  6 in box taps alt and lateral x10 each      Knee/Hip Exercises: Seated   Long Arc Quad  2 sets;10 reps;Both;5 reps;Weights    Long Arc Quad Weight  2 lbs.    Ball Squeeze  x15 3sec hold    Hamstring Curl  2 sets;Both;10 reps    Hamstring Limitations  green    Sit to Sand  2 sets;5 reps;without UE  support holding red ball                PT Short Term Goals - 02/08/18 1349      PT SHORT TERM GOAL #1   Title  independent with intiial HEP    Status  Achieved        PT Long Term Goals - 01/25/18 1535      PT LONG TERM GOAL #1   Title  decrease pain in theAM 50%    Time  8    Period  Weeks    Status  New      PT LONG TERM GOAL #2   Title  understand proper posture and body mechanics    Time  8    Period  Weeks    Status  New      PT LONG TERM GOAL #3   Title  decrease TUG test to 15 seconds    Time  8    Period  Weeks    Status  New            Plan - 02/10/18 1418    Clinical Impression Statement  Pt able to complete all of today's interventions well. She reports fatigue with standing hip abd/add. increase resistance tolerated with seated HS curls. Some instability with resisted side steps more so when the resistance ins on her L side. LE does get caught up at time with alternating box taps and with lateral.    Rehab Potential  Good    PT  Treatment/Interventions  ADLs/Self Care Home Management;Electrical Stimulation;Moist Heat;Therapeutic exercise;Therapeutic activities;Functional mobility training;Patient/family education;Manual techniques    PT Next Visit Plan  slowly start strength and flexibility       Patient will benefit from skilled therapeutic intervention in order to improve the following deficits and impairments:  Abnormal gait, Decreased range of motion, Difficulty walking, Increased muscle spasms, Pain, Impaired flexibility  Visit Diagnosis: Difficulty in walking, not elsewhere classified  Acute left-sided low back pain with left-sided sciatica     Problem List Patient Active Problem List   Diagnosis Date Noted  . Malignant neoplasm of upper-outer quadrant of right breast in female, estrogen receptor positive ([place]) 06/29/2017  . Unspecified vitamin D deficiency 11/27/2013  . Hematuria 10/31/2013  . Physical exam, annual 08/02/2013  . Other and unspecified hyperlipidemia 07/31/2013  . Diabetes mellitus without complication ([place])   . Hypertension   . Arthritis     Scot Jun, PTA 02/10/2018, 2:21 PM  [place], [place], [address] Phone: [phone]   Fax:  [phone]  Name: Takirah Binford MRN: [id number] Date of Birth: April 30, 1926

## 2018-02-11 DIAGNOSIS — H04123 Dry eye syndrome of bilateral lacrimal glands: Secondary | ICD-10-CM | POA: Diagnosis not present

## 2018-02-11 DIAGNOSIS — H16223 Keratoconjunctivitis sicca, not specified as Sjogren's, bilateral: Secondary | ICD-10-CM | POA: Diagnosis not present

## 2018-02-11 DIAGNOSIS — Z83511 Family history of glaucoma: Secondary | ICD-10-CM | POA: Diagnosis not present

## 2018-02-11 DIAGNOSIS — H40052 Ocular hypertension, left eye: Secondary | ICD-10-CM | POA: Diagnosis not present

## 2018-02-14 ENCOUNTER — Encounter: Payer: Self-pay | Admitting: Physical Therapy

## 2018-02-14 ENCOUNTER — Ambulatory Visit: Payer: Medicare PPO | Admitting: Physical Therapy

## 2018-02-14 DIAGNOSIS — M5442 Lumbago with sciatica, left side: Secondary | ICD-10-CM

## 2018-02-14 DIAGNOSIS — R262 Difficulty in walking, not elsewhere classified: Secondary | ICD-10-CM | POA: Diagnosis not present

## 2018-02-14 NOTE — Therapy (Signed)
[place], [place], [address] Phone: [phone]   Fax:  [phone]  Physical Therapy Treatment  Patient Details  Name: Kristen Hill MRN: [id number] Date of Birth: [birth year] Referring Provider: Dorna Leitz   Encounter Date: 02/14/2018  PT End of Session - 02/14/18 1511    Visit Number  5    Date for PT Re-Evaluation  03/27/18    PT Start Time  2336    PT Stop Time  1511    PT Time Calculation (min)  43 min    Activity Tolerance  Patient tolerated treatment well    Behavior During Therapy  [facility] for tasks assessed/performed       Past Medical History:  Diagnosis Date  . Arthritis    right knee, torn meniscus lateral and medial  . Cancer ([place])    right breast  . Diabetes mellitus without complication ([place]) 1224   type 2  . Glaucoma    left eye  . Hematuria 10/31/2013  . History of external beam radiation therapy 07/21/17- 08/11/17   Right Breast 2.66 Gy X 16 fractions  . Hypertension   . Jaundice    in 1947  . Measles as a child  . Mumps as a child  . Other and unspecified hyperlipidemia 07/31/2013  . Physical exam, annual 08/02/2013   Se[facility] Dr Trinna Post at [facility] Dr Farris Has, Retinal specialist Dermatolgy  . Thyroid nodule   . Wears glasses   . Wears partial dentures     Past Surgical History:  Procedure Laterality Date  . BREAST LUMPECTOMY WITH RADIOACTIVE SEED LOCALIZATION Right 06/14/2017   Procedure: [place] BREAST LUMPECTOMY WITH RADIOACTIVE SEED LOCALIZATION ERAS PATHWAY;  Surgeon: Jovita Kussmaul, MD;  Location: [place];  Service: General;  Laterality: Right;  . EYE SURGERY  2009 and 2010   cataract both eyes  . EYE SURGERY  2012   laser to left eye, chalazion was removed with laser  . KNEE SURGERY Right    medial meniscus and lateral menicus  . MULTIPLE TOOTH EXTRACTIONS    . TONSILLECTOMY  84 yrs old    There were no vitals filed for this  visit.  Subjective Assessment - 02/14/18 1432    Subjective  "It is always painful when i get up, but then I do the exercises and Im fine"    Currently in Pain?  No/denies    Pain Score  0-No pain         [facility] PT Assessment - 02/14/18 0001      Standardized Balance Assessment   Standardized Balance Assessment  Timed Up and Go Test      Timed Up and Go Test   Normal TUG (seconds)  11.84                   [facility] Adult PT Treatment/Exercise - 02/14/18 0001      High Level Balance   High Level Balance Activities  Backward walking;Side stepping      Knee/Hip Exercises: Aerobic   Nustep  L3 x6 min LE only       Knee/Hip Exercises: Standing   Hip Flexion  1 set;Both;20 reps;Knee bent    Hip Abduction  Both;10 reps;1 set    Hip Extension  Both;1 set;10 reps    Other Standing Knee Exercises  6 in box taps x20      Knee/Hip Exercises: Seated   Long Arc Quad  2  sets;10 reps;Both;Weights    Long Arc Quad Weight  3 lbs.    Ball Squeeze  x15 3sec hold    Hamstring Curl  2 sets;Both;10 reps    Hamstring Limitations  green    Sit to Sand  2 sets;5 reps;without UE support;10 reps holding red ball for the 5 set                PT Short Term Goals - 02/08/18 1349      PT SHORT TERM GOAL #1   Title  independent with intiial HEP    Status  Achieved        PT Long Term Goals - 02/14/18 1438      PT LONG TERM GOAL #2   Title  understand proper posture and body mechanics            Plan - 02/14/18 1512    Clinical Impression Statement  Pt Continues to progress, she has also met some LTG's decreasing her TUG time. Pt LE strength has increase tolerating increase weight and reps. No LOB with balance interventions. She does hesitate with Alt box taps.     Rehab Potential  Good    PT Frequency  2x / week    PT Duration  8 weeks    PT Treatment/Interventions  ADLs/Self Care Home Management;Electrical Stimulation;Moist Heat;Therapeutic exercise;Therapeutic  activities;Functional mobility training;Patient/family education;Manual techniques    PT Next Visit Plan  slowly start stregnth and flexibility       Patient will benefit from skilled therapeutic intervention in order to improve the following deficits and impairments:  Abnormal gait, Decreased range of motion, Difficulty walking, Increased muscle spasms, Pain, Impaired flexibility  Visit Diagnosis: Acute left-sided low back pain with left-sided sciatica  Difficulty in walking, not elsewhere classified     Problem List Patient Active Problem List   Diagnosis Date Noted  . Malignant neoplasm of upper-outer quadrant of right breast in female, estrogen receptor positive ([place]) 06/29/2017  . Unspecified vitamin D deficiency 11/27/2013  . Hematuria 10/31/2013  . Physical exam, annual 08/02/2013  . Other and unspecified hyperlipidemia 07/31/2013  . Diabetes mellitus without complication ([place])   . Hypertension   . Arthritis     Scot Jun, PTA 02/14/2018, 3:14 PM  [place], [place], [address] Phone: [phone]   Fax:  [phone]  Name: Kristen Hill MRN: [id number] Date of Birth: 1926-08-12

## 2018-02-17 ENCOUNTER — Ambulatory Visit: Payer: Medicare PPO | Admitting: Physical Therapy

## 2018-02-17 ENCOUNTER — Encounter: Payer: Medicare PPO | Admitting: Physical Therapy

## 2018-02-17 ENCOUNTER — Encounter: Payer: Self-pay | Admitting: Physical Therapy

## 2018-02-17 DIAGNOSIS — M5442 Lumbago with sciatica, left side: Secondary | ICD-10-CM

## 2018-02-17 DIAGNOSIS — R262 Difficulty in walking, not elsewhere classified: Secondary | ICD-10-CM

## 2018-02-17 NOTE — Therapy (Signed)
[place], [place], [address] Phone: [phone]   Fax:  [phone]  Physical Therapy Treatment  Patient Details  Name: Kristen Hill MRN: [id number] Date of Birth: 10/26/1925 Referring Provider: Dorna Leitz   Encounter Date: 02/17/2018  PT End of Session - 02/17/18 1140    Visit Number  6    Date for PT Re-Evaluation  03/27/18    PT Start Time  1100    PT Stop Time  1140    PT Time Calculation (min)  40 min    Activity Tolerance  Patient tolerated treatment well    Behavior During Therapy  [facility] for tasks assessed/performed       Past Medical History:  Diagnosis Date  . Arthritis    right knee, torn meniscus lateral and medial  . Cancer ([place])    right breast  . Diabetes mellitus without complication ([place]) 6384   type 2  . Glaucoma    left eye  . Hematuria 10/31/2013  . History of external beam radiation therapy 07/21/17- 08/11/17   Right Breast 2.66 Gy X 16 fractions  . Hypertension   . Jaundice    in 1947  . Measles as a child  . Mumps as a child  . Other and unspecified hyperlipidemia 07/31/2013  . Physical exam, annual 08/02/2013   Sees Dr Trinna Post at [facility] Dr Farris Has, Retinal specialist Dermatolgy  . Thyroid nodule   . Wears glasses   . Wears partial dentures     Past Surgical History:  Procedure Laterality Date  . BREAST LUMPECTOMY WITH RADIOACTIVE SEED LOCALIZATION Right 06/14/2017   Procedure: [place] BREAST LUMPECTOMY WITH RADIOACTIVE SEED LOCALIZATION ERAS PATHWAY;  Surgeon: Jovita Kussmaul, MD;  Location: [place];  Service: General;  Laterality: Right;  . EYE SURGERY  2009 and 2010   cataract both eyes  . EYE SURGERY  2012   laser to left eye, chalazion was removed with laser  . KNEE SURGERY Right    medial meniscus and lateral menicus  . MULTIPLE TOOTH EXTRACTIONS    . TONSILLECTOMY  84 yrs old    There were no vitals filed for this  visit.  Subjective Assessment - 02/17/18 1102    Subjective  "In the morning, pain always in the morning, but I am fine now. "    Currently in Pain?  No/denies    Pain Score  0-No pain                       [facility] Adult PT Treatment/Exercise - 02/17/18 0001      Knee/Hip Exercises: Aerobic   Nustep  L5 x7 min       Knee/Hip Exercises: Standing   Lateral Step Up  Both;1 set;5 reps;Hand Hold: 1;Step Height: 4"    Walking with Sports Cord  20lb side step x3 each      Knee/Hip Exercises: Seated   Long Arc Quad  2 sets;10 reps;Both;Weights    Long Arc Quad Weight  3 lbs.    Ball Squeeze  2x10 3sec hold    Marching  Both;1 set;10 reps    Hamstring Curl  2 sets;Both;10 reps    Hamstring Limitations  green    Sit to Sand  2 sets;5 reps;without UE support;10 reps holding red ball                PT Short Term Goals - 02/08/18 1349  PT SHORT TERM GOAL #1   Title  independent with intiial HEP    Status  Achieved        PT Long Term Goals - 02/14/18 1438      PT LONG TERM GOAL #2   Title  understand proper posture and body mechanics            Plan - 02/17/18 1140    Clinical Impression Statement  Pt with some instability with resisted gait LOB X1 min assist to correct, she also has some weakness with lateral step ups. Cues for proper LE placement and to lean forward with sit to stands. Pt tends to fatigue quick with functional interventions    Rehab Potential  Good    PT Frequency  2x / week    PT Duration  8 weeks    PT Treatment/Interventions  ADLs/Self Care Home Management;Electrical Stimulation;Moist Heat;Therapeutic exercise;Therapeutic activities;Functional mobility training;Patient/family education;Manual techniques    PT Next Visit Plan  slowly start stregnth and flexibility       Patient will benefit from skilled therapeutic intervention in order to improve the following deficits and impairments:  Abnormal gait, Decreased range of  motion, Difficulty walking, Increased muscle spasms, Pain, Impaired flexibility  Visit Diagnosis: Acute left-sided low back pain with left-sided sciatica  Difficulty in walking, not elsewhere classified     Problem List Patient Active Problem List   Diagnosis Date Noted  . Malignant neoplasm of upper-outer quadrant of right breast in female, estrogen receptor positive ([place]) 06/29/2017  . Unspecified vitamin D deficiency 11/27/2013  . Hematuria 10/31/2013  . Physical exam, annual 08/02/2013  . Other and unspecified hyperlipidemia 07/31/2013  . Diabetes mellitus without complication ([place])   . Hypertension   . Arthritis     Scot Jun, PTA 02/17/2018, 11:43 AM  [place], [place], [address] Phone: [phone]   Fax:  [phone]  Name: Kristen Hill MRN: [id number] Date of Birth: 1926-01-02

## 2018-02-22 DIAGNOSIS — M25561 Pain in right knee: Secondary | ICD-10-CM | POA: Diagnosis not present

## 2018-02-23 ENCOUNTER — Encounter: Payer: Self-pay | Admitting: Physical Therapy

## 2018-02-23 ENCOUNTER — Ambulatory Visit: Payer: Medicare PPO | Admitting: Physical Therapy

## 2018-02-23 DIAGNOSIS — M5442 Lumbago with sciatica, left side: Secondary | ICD-10-CM

## 2018-02-23 DIAGNOSIS — R262 Difficulty in walking, not elsewhere classified: Secondary | ICD-10-CM

## 2018-02-23 NOTE — Therapy (Signed)
[place], [place], [address] Phone: [phone]   Fax:  [phone]  Physical Therapy Treatment  Patient Details  Name: Kristen Hill MRN: [id number] Date of Birth: [birth year] Referring Provider: Dorna Leitz   Encounter Date: 02/23/2018  PT End of Session - 02/23/18 0924    Visit Number  7    Date for PT Re-Evaluation  03/27/18    PT Start Time  0845    PT Stop Time  0930    PT Time Calculation (min)  45 min    Activity Tolerance  Patient tolerated treatment well    Behavior During Therapy  [facility] for tasks assessed/performed       Past Medical History:  Diagnosis Date  . Arthritis    right knee, torn meniscus lateral and medial  . Cancer ([place])    right breast  . Diabetes mellitus without complication ([place]) 4827   type 2  . Glaucoma    left eye  . Hematuria 10/31/2013  . History of external beam radiation therapy 07/21/17- 08/11/17   Right Breast 2.66 Gy X 16 fractions  . Hypertension   . Jaundice    in 1947  . Measles as a child  . Mumps as a child  . Other and unspecified hyperlipidemia 07/31/2013  . Physical exam, annual 08/02/2013   Sees Dr Trinna Post at [facility] Dr Farris Has, Retinal specialist Dermatolgy  . Thyroid nodule   . Wears glasses   . Wears partial dentures     Past Surgical History:  Procedure Laterality Date  . BREAST LUMPECTOMY WITH RADIOACTIVE SEED LOCALIZATION Right 06/14/2017   Procedure: [place] BREAST LUMPECTOMY WITH RADIOACTIVE SEED LOCALIZATION ERAS PATHWAY;  Surgeon: Jovita Kussmaul, MD;  Location: [place];  Service: General;  Laterality: Right;  . EYE SURGERY  2009 and 2010   cataract both eyes  . EYE SURGERY  2012   laser to left eye, chalazion was removed with laser  . KNEE SURGERY Right    medial meniscus and lateral menicus  . MULTIPLE TOOTH EXTRACTIONS    . TONSILLECTOMY  84 yrs old    There were no vitals filed for this  visit.  Subjective Assessment - 02/23/18 0849    Subjective  Pt reports a injection in R knee yesterday. Continues to reports in L hip when she wakes up, but pain goes away after exercises.     Currently in Pain?  No/denies    Pain Score  0-No pain                       [facility] Adult PT Treatment/Exercise - 02/23/18 0001      Knee/Hip Exercises: Stretches   Passive Hamstring Stretch  5 reps;10 seconds;Left;Right    Piriformis Stretch  Right;Left;3 reps;10 seconds      Knee/Hip Exercises: Aerobic   Nustep  L4 x7 min       Knee/Hip Exercises: Supine   Bridges  Both;2 sets;10 reps    Bridges with [employer]  Both;1 set;10 reps    Other Supine Knee/Hip Exercises  ball squeeze x15     Other Supine Knee/Hip Exercises  Hip abd/add x5 then x7               PT Short Term Goals - 02/08/18 1349      PT SHORT TERM GOAL #1   Title  independent with intiial HEP    Status  Achieved        PT Long Term Goals - 02/14/18 1438      PT LONG TERM GOAL #2   Title  understand proper posture and body mechanics            Plan - 02/23/18 0924    Clinical Impression Statement  Pt reports receiving a injection in her R knee yesterday so minium about of stress placed on it tin today's treatment. PT tolerated supine exercises well. She does fatigue with SLR and hip abd/add. No reports of increase pain. Tightness noted in L HS.    Rehab Potential  Good    PT Frequency  2x / week    PT Duration  8 weeks    PT Treatment/Interventions  ADLs/Self Care Home Management;Electrical Stimulation;Moist Heat;Therapeutic exercise;Therapeutic activities;Functional mobility training;Patient/family education;Manual techniques    PT Next Visit Plan  strength and flexibility for hips and low back       Patient will benefit from skilled therapeutic intervention in order to improve the following deficits and impairments:  Abnormal gait, Decreased range of motion, Difficulty walking,  Increased muscle spasms, Pain, Impaired flexibility  Visit Diagnosis: Acute left-sided low back pain with left-sided sciatica  Difficulty in walking, not elsewhere classified     Problem List Patient Active Problem List   Diagnosis Date Noted  . Malignant neoplasm of upper-outer quadrant of right breast in female, estrogen receptor positive ([place]) 06/29/2017  . Unspecified vitamin D deficiency 11/27/2013  . Hematuria 10/31/2013  . Physical exam, annual 08/02/2013  . Other and unspecified hyperlipidemia 07/31/2013  . Diabetes mellitus without complication ([place])   . Hypertension   . Arthritis     Scot Jun, PTA 02/23/2018, 9:28 AM  [place], [place], [address] Phone: [phone]   Fax:  [phone]  Name: Kristen Hill MRN: [id number] Date of Birth: [birth year]

## 2018-02-25 ENCOUNTER — Ambulatory Visit: Payer: Medicare PPO | Admitting: Physical Therapy

## 2018-02-25 ENCOUNTER — Encounter: Payer: Self-pay | Admitting: Physical Therapy

## 2018-02-25 DIAGNOSIS — R262 Difficulty in walking, not elsewhere classified: Secondary | ICD-10-CM

## 2018-02-25 DIAGNOSIS — M5442 Lumbago with sciatica, left side: Secondary | ICD-10-CM | POA: Diagnosis not present

## 2018-02-25 NOTE — Therapy (Signed)
[place], [place], [address] Phone: [phone]   Fax:  [phone]  Physical Therapy Treatment  Patient Details  Name: Kristen Hill MRN: [id number] Date of Birth: December 11, 1925 Referring Provider: Dorna Leitz   Encounter Date: 02/25/2018  PT End of Session - 02/25/18 1125    Visit Number  8    Date for PT Re-Evaluation  03/27/18    PT Start Time  1050    PT Stop Time  1135    PT Time Calculation (min)  45 min       Past Medical History:  Diagnosis Date  . Arthritis    right knee, torn meniscus lateral and medial  . Cancer ([place])    right breast  . Diabetes mellitus without complication ([place]) 0156   type 2  . Glaucoma    left eye  . Hematuria 10/31/2013  . History of external beam radiation therapy 07/21/17- 08/11/17   Right Breast 2.66 Gy X 16 fractions  . Hypertension   . Jaundice    in 1947  . Measles as a child  . Mumps as a child  . Other and unspecified hyperlipidemia 07/31/2013  . Physical exam, annual 08/02/2013   Sees Dr Trinna Post at [facility] Dr Farris Has, Retinal specialist Dermatolgy  . Thyroid nodule   . Wears glasses   . Wears partial dentures     Past Surgical History:  Procedure Laterality Date  . BREAST LUMPECTOMY WITH RADIOACTIVE SEED LOCALIZATION Right 06/14/2017   Procedure: [place] BREAST LUMPECTOMY WITH RADIOACTIVE SEED LOCALIZATION ERAS PATHWAY;  Surgeon: Jovita Kussmaul, MD;  Location: [place];  Service: General;  Laterality: Right;  . EYE SURGERY  2009 and 2010   cataract both eyes  . EYE SURGERY  2012   laser to left eye, chalazion was removed with laser  . KNEE SURGERY Right    medial meniscus and lateral menicus  . MULTIPLE TOOTH EXTRACTIONS    . TONSILLECTOMY  84 yrs old    There were no vitals filed for this visit.  Subjective Assessment - 02/25/18 1052    Subjective  knee feels better, still same morninghip pain    Currently in Pain?   No/denies                       [facility] Adult PT Treatment/Exercise - 02/25/18 0001      High Level Balance   High Level Balance Activities  Marching forwards;Backward walking;Side stepping HHA 3 # ankle wts    High Level Balance Comments  HHA on airex 3# hip 4 way 10 reps each      Lumbar Exercises: Stretches   Active Hamstring Stretch  Right;Left;3 reps;20 seconds issued for home using black tband    Passive Hamstring Stretch  Left;Right;3 reps;20 seconds      Knee/Hip Exercises: Aerobic   Nustep  L4 x7 min       Knee/Hip Exercises: Seated   Long Arc Quad  2 sets;10 reps;Both;Weights    Long Arc Quad Weight  3 lbs.    Ball Squeeze  2x10 3sec hold    Marching  Strengthening;Both;2 sets;10 reps;Weights    Marching Weights  3 lbs.    Abduction/Adduction   Strengthening;Both;2 sets;10 reps;Weights    Sit to Sand  2 sets;5 reps;without UE support;10 reps holding red ball      Knee/Hip Exercises: Supine   Bridges with [employer]  Both;1  set;10 reps    Straight Leg Raises  Strengthening;Both;10 reps SLR with abd             PT Education - 02/25/18 1132    Education Details  HS stretch    Person(s) Educated  Patient    Methods  Explanation;Demonstration    Comprehension  Verbalized understanding;Returned demonstration       PT Short Term Goals - 02/08/18 1349      PT SHORT TERM GOAL #1   Title  independent with intiial HEP    Status  Achieved        PT Long Term Goals - 02/25/18 1125      PT LONG TERM GOAL #1   Title  decrease pain in theAM 50%    Baseline  stretching past couple days has helped morning pain    Status  Partially Met      PT LONG TERM GOAL #2   Title  understand proper posture and body mechanics    Status  Partially Met      PT LONG TERM GOAL #3   Title  decrease TUG test to 15 seconds    Status  Achieved            Plan - 02/25/18 1133    Clinical Impression Statement  TUG goal met and progressing wth other 2  goals. Pt felt HS strteches are helpfula nd did note some decreased morning pain, reviewed how to do at home and gave black tband to aid. pt tolerated ex for strength and balance well.    PT Next Visit Plan  stregnth and flexibility for hips and low back       Patient will benefit from skilled therapeutic intervention in order to improve the following deficits and impairments:  Abnormal gait, Decreased range of motion, Difficulty walking, Increased muscle spasms, Pain, Impaired flexibility  Visit Diagnosis: Acute left-sided low back pain with left-sided sciatica  Difficulty in walking, not elsewhere classified     Problem List Patient Active Problem List   Diagnosis Date Noted  . Malignant neoplasm of upper-outer quadrant of right breast in female, estrogen receptor positive ([place]) 06/29/2017  . Unspecified vitamin D deficiency 11/27/2013  . Hematuria 10/31/2013  . Physical exam, annual 08/02/2013  . Other and unspecified hyperlipidemia 07/31/2013  . Diabetes mellitus without complication ([place])   . Hypertension   . Arthritis     Bryant Lipps,[name] PTA 02/25/2018, 11:35 AM  [place], [place], [address] Phone: [phone]   Fax:  [phone]  Name: Zakya Halabi MRN: [id number] Date of Birth: 1926-08-20

## 2018-03-01 ENCOUNTER — Encounter: Payer: Self-pay | Admitting: Physical Therapy

## 2018-03-01 ENCOUNTER — Ambulatory Visit: Payer: Medicare PPO | Admitting: Physical Therapy

## 2018-03-01 ENCOUNTER — Ambulatory Visit: Payer: Medicare PPO | Attending: Orthopedic Surgery | Admitting: Physical Therapy

## 2018-03-01 DIAGNOSIS — M5442 Lumbago with sciatica, left side: Secondary | ICD-10-CM | POA: Insufficient documentation

## 2018-03-01 DIAGNOSIS — R262 Difficulty in walking, not elsewhere classified: Secondary | ICD-10-CM | POA: Diagnosis not present

## 2018-03-01 NOTE — Therapy (Signed)
[place], [place], [address] Phone: [phone]   Fax:  [phone]  Physical Therapy Treatment  Patient Details  Name: Kristen Hill MRN: [id number] Date of Birth: 05-Dec-1925 Referring Provider: Dorna Leitz   Encounter Date: 03/01/2018  PT End of Session - 03/01/18 1009    Visit Number  9    Date for PT Re-Evaluation  03/27/18    PT Start Time  0930    PT Stop Time  1012    PT Time Calculation (min)  42 min    Activity Tolerance  Patient tolerated treatment well    Behavior During Therapy  [facility] for tasks assessed/performed       Past Medical History:  Diagnosis Date  . Arthritis    right knee, torn meniscus lateral and medial  . Cancer ([place])    right breast  . Diabetes mellitus without complication ([place]) 6283   type 2  . Glaucoma    left eye  . Hematuria 10/31/2013  . History of external beam radiation therapy 07/21/17- 08/11/17   Right Breast 2.66 Gy X 16 fractions  . Hypertension   . Jaundice    in 1947  . Measles as a child  . Mumps as a child  . Other and unspecified hyperlipidemia 07/31/2013  . Physical exam, annual 08/02/2013   Sees Dr Trinna Post at [facility] Dr Farris Has, Retinal specialist Dermatolgy  . Thyroid nodule   . Wears glasses   . Wears partial dentures     Past Surgical History:  Procedure Laterality Date  . BREAST LUMPECTOMY WITH RADIOACTIVE SEED LOCALIZATION Right 06/14/2017   Procedure: [place] BREAST LUMPECTOMY WITH RADIOACTIVE SEED LOCALIZATION ERAS PATHWAY;  Surgeon: Jovita Kussmaul, MD;  Location: [place];  Service: General;  Laterality: Right;  . EYE SURGERY  2009 and 2010   cataract both eyes  . EYE SURGERY  2012   laser to left eye, chalazion was removed with laser  . KNEE SURGERY Right    medial meniscus and lateral menicus  . MULTIPLE TOOTH EXTRACTIONS    . TONSILLECTOMY  84 yrs old    There were no vitals filed for this  visit.  Subjective Assessment - 03/01/18 0929    Subjective  [facility] [place] [facility] for 20 minutes and bike for 10, "I felt it this morning big time"    Currently in Pain?  No/denies    Pain Score  0-No pain                       [facility] Adult PT Treatment/Exercise - 03/01/18 0001      High Level Balance   High Level Balance Activities  Side stepping;Backward walking      Knee/Hip Exercises: Aerobic   Recumbent Bike  L0 x 61min     Nustep  L3 x4 min LE only       Knee/Hip Exercises: Machines for Strengthening   Cybex Knee Extension  5lb 2x10     Cybex Knee Flexion  20lb 2x10       Knee/Hip Exercises: Standing   Other Standing Knee Exercises  Hip and and ext red band x15 each       Knee/Hip Exercises: Seated   Sit to Sand  2 sets;10 reps;with UE support holding red ball                PT Short  Term Goals - 02/08/18 1349      PT SHORT TERM GOAL #1   Title  independent with intiial HEP    Status  Achieved        PT Long Term Goals - 03/01/18 1009      PT LONG TERM GOAL #1   Title  decrease pain in theAM 50%    Status  New      PT LONG TERM GOAL #2   Title  understand proper posture and body mechanics    Status  Achieved      PT LONG TERM GOAL #3   Title  decrease TUG test to 15 seconds    Status  Achieved            Plan - 03/01/18 1009    Clinical Impression Statement  Continues to reports morning hip pain that goes away after she stretches. Progressed with some interventions today and she reports increase fatigue. Some instability with backwards walking LOB x1 min assist to correct.    Rehab Potential  Good    PT Next Visit Plan  strength and flexibility for hips and low back    PT Home Exercise Plan  Bridges, ball squeeze and bridge, Hip abd/add, ball squeezes       Patient will benefit from skilled therapeutic intervention in order to improve the following deficits and impairments:  Abnormal gait,  Decreased range of motion, Difficulty walking, Increased muscle spasms, Pain, Impaired flexibility  Visit Diagnosis: Acute left-sided low back pain with left-sided sciatica  Difficulty in walking, not elsewhere classified     Problem List Patient Active Problem List   Diagnosis Date Noted  . Malignant neoplasm of upper-outer quadrant of right breast in female, estrogen receptor positive ([place]) 06/29/2017  . Unspecified vitamin D deficiency 11/27/2013  . Hematuria 10/31/2013  . Physical exam, annual 08/02/2013  . Other and unspecified hyperlipidemia 07/31/2013  . Diabetes mellitus without complication ([place])   . Hypertension   . Arthritis     Scot Jun, PTA 03/01/2018, 10:14 AM  [place], [place], [address] Phone: [phone]   Fax:  [phone]  Name: Kristen Hill MRN: [id number] Date of Birth: 1926-06-27

## 2018-03-03 ENCOUNTER — Encounter: Payer: Self-pay | Admitting: Physical Therapy

## 2018-03-03 ENCOUNTER — Ambulatory Visit: Payer: Medicare PPO | Admitting: Physical Therapy

## 2018-03-03 DIAGNOSIS — R262 Difficulty in walking, not elsewhere classified: Secondary | ICD-10-CM | POA: Diagnosis not present

## 2018-03-03 DIAGNOSIS — M5442 Lumbago with sciatica, left side: Secondary | ICD-10-CM

## 2018-03-03 NOTE — Therapy (Signed)
[place], [place], [address] Phone: [phone]   Fax:  [phone]  Physical Therapy Treatment  Patient Details  Name: Kristen Hill MRN: [id number] Date of Birth: 06/26/1926 Referring Provider: Dorna Leitz   Encounter Date: 03/03/2018  PT End of Session - 03/03/18 1220    Visit Number  10    Date for PT Re-Evaluation  03/27/18    PT Start Time  1140    PT Stop Time  1220    PT Time Calculation (min)  40 min    Activity Tolerance  Patient tolerated treatment well    Behavior During Therapy  [facility] for tasks assessed/performed       Past Medical History:  Diagnosis Date  . Arthritis    right knee, torn meniscus lateral and medial  . Cancer ([place])    right breast  . Diabetes mellitus without complication ([place]) 3614   type 2  . Glaucoma    left eye  . Hematuria 10/31/2013  . History of external beam radiation therapy 07/21/17- 08/11/17   Right Breast 2.66 Gy X 16 fractions  . Hypertension   . Jaundice    in 1947  . Measles as a child  . Mumps as a child  . Other and unspecified hyperlipidemia 07/31/2013  . Physical exam, annual 08/02/2013   Se[facility] Dr Trinna Post at [facility] Dr Farris Has, Retinal specialist Dermatolgy  . Thyroid nodule   . Wears glasses   . Wears partial dentures     Past Surgical History:  Procedure Laterality Date  . BREAST LUMPECTOMY WITH RADIOACTIVE SEED LOCALIZATION Right 06/14/2017   Procedure: [place] BREAST LUMPECTOMY WITH RADIOACTIVE SEED LOCALIZATION ERAS PATHWAY;  Surgeon: Jovita Kussmaul, MD;  Location: [place];  Service: General;  Laterality: Right;  . EYE SURGERY  2009 and 2010   cataract both eyes  . EYE SURGERY  2012   laser to left eye, chalazion was removed with laser  . KNEE SURGERY Right    medial meniscus and lateral menicus  . MULTIPLE TOOTH EXTRACTIONS    . TONSILLECTOMY  84 yrs old    There were no vitals filed for this  visit.  Subjective Assessment - 03/03/18 1144    Subjective  "Pretty good" Pt reports that her hip still hurts in the morning but goes away after stretches.    Currently in Pain?  No/denies    Pain Score  0-No pain                       [facility] Adult PT Treatment/Exercise - 03/03/18 0001      Knee/Hip Exercises: Aerobic   Recumbent Bike  L0 x 71min     Nustep  L4 x6 min       Knee/Hip Exercises: Machines for Strengthening   Cybex Knee Extension  5lb 2x10     Cybex Knee Flexion  20lb 2x10     Cybex Leg Press  20lb 2x10       Knee/Hip Exercises: Standing   Walking with Sports Cord  10lb 4 way x3 each      Knee/Hip Exercises: Seated   Sit to Sand  2 sets;10 reps;with UE support               PT Short Term Goals - 02/08/18 1349      PT SHORT TERM GOAL #1   Title  independent with intiial HEP  Status  Achieved        PT Long Term Goals - 03/03/18 1222      PT LONG TERM GOAL #1   Title  decrease pain in theAM 50%    Status  On-going      PT LONG TERM GOAL #2   Title  understand proper posture and body mechanics    Status  Achieved      PT LONG TERM GOAL #3   Title  decrease TUG test to 15 seconds    Status  Achieved            Plan - 03/03/18 1221    Clinical Impression Statement  Pt with increase fatigue with the added cardio warm up and leg press machine. Some instability with resisted walking. Good posture with sit to stand.    Rehab Potential  Good    PT Frequency  2x / week    PT Duration  8 weeks    PT Treatment/Interventions  ADLs/Self Care Home Management;Electrical Stimulation;Moist Heat;Therapeutic exercise;Therapeutic activities;Functional mobility training;Patient/family education;Manual techniques    PT Next Visit Plan  strength and flexibility for hips and low back       Patient will benefit from skilled therapeutic intervention in order to improve the following deficits and impairments:  Abnormal gait, Decreased range  of motion, Difficulty walking, Increased muscle spasms, Pain, Impaired flexibility  Visit Diagnosis: Acute left-sided low back pain with left-sided sciatica  Difficulty in walking, not elsewhere classified     Problem List Patient Active Problem List   Diagnosis Date Noted  . Malignant neoplasm of upper-outer quadrant of right breast in female, estrogen receptor positive ([place]) 06/29/2017  . Unspecified vitamin D deficiency 11/27/2013  . Hematuria 10/31/2013  . Physical exam, annual 08/02/2013  . Other and unspecified hyperlipidemia 07/31/2013  . Diabetes mellitus without complication ([place])   . Hypertension   . Arthritis     Scot Jun, PTA 03/03/2018, 12:22 PM  [place], [place], [address] Phone: [phone]   Fax:  [phone]  Name: Kristen Hill MRN: [id number] Date of Birth: [birth year]

## 2018-03-10 ENCOUNTER — Other Ambulatory Visit: Payer: Self-pay | Admitting: Orthopedic Surgery

## 2018-03-17 ENCOUNTER — Other Ambulatory Visit: Payer: Self-pay | Admitting: Orthopedic Surgery

## 2018-03-23 ENCOUNTER — Ambulatory Visit: Payer: Medicare PPO | Admitting: Physical Therapy

## 2018-03-23 ENCOUNTER — Encounter: Payer: Self-pay | Admitting: Physical Therapy

## 2018-03-23 DIAGNOSIS — R262 Difficulty in walking, not elsewhere classified: Secondary | ICD-10-CM

## 2018-03-23 DIAGNOSIS — M5442 Lumbago with sciatica, left side: Secondary | ICD-10-CM

## 2018-03-23 NOTE — Therapy (Signed)
[place], [place], [address] Phone: [phone]   Fax:  [phone]  Physical Therapy Evaluation  Patient Details  Name: Aries Kasa MRN: [id number] Date of Birth: [birth year] Referring Provider: Dorna Leitz   Encounter Date: 03/23/2018  PT End of Session - 03/23/18 1425    Visit Number  11    Date for PT Re-Evaluation  03/27/18    PT Start Time  1345    PT Stop Time  1428    PT Time Calculation (min)  43 min    Activity Tolerance  Patient tolerated treatment well    Behavior During Therapy  [facility] for tasks assessed/performed       Past Medical History:  Diagnosis Date  . Arthritis    right knee, torn meniscus lateral and medial  . Cancer ([place])    right breast  . Diabetes mellitus without complication ([place]) 4401   type 2  . Glaucoma    left eye  . Hematuria 10/31/2013  . History of external beam radiation therapy 07/21/17- 08/11/17   Right Breast 2.66 Gy X 16 fractions  . Hypertension   . Jaundice    in 1947  . Measles as a child  . Mumps as a child  . Other and unspecified hyperlipidemia 07/31/2013  . Physical exam, annual 08/02/2013   Sees Dr Trinna Post at [facility] Dr Farris Has, Retinal specialist Dermatolgy  . Thyroid nodule   . Wears glasses   . Wears partial dentures     Past Surgical History:  Procedure Laterality Date  . BREAST LUMPECTOMY WITH RADIOACTIVE SEED LOCALIZATION Right 06/14/2017   Procedure: [place] BREAST LUMPECTOMY WITH RADIOACTIVE SEED LOCALIZATION ERAS PATHWAY;  Surgeon: Jovita Kussmaul, MD;  Location: [facility];  Service: General;  Laterality: Right;  . EYE SURGERY  2009 and 2010   cataract both eyes  . EYE SURGERY  2012   laser to left eye, chalazion was removed with laser  . KNEE SURGERY Right    medial meniscus and lateral menicus  . MULTIPLE TOOTH EXTRACTIONS    . TONSILLECTOMY  84 yrs old    There were no vitals filed for this  visit.   Subjective Assessment - 03/23/18 1350    Subjective  "My knee is hurting me, the shot has worn off" Pt reports that her hip is the same. Pt reports that she is scheduled for surgery July 12 th partial knee replacement    Pain Score  7     Pain Location  Knee    Pain Orientation  Right                    Objective measurements completed on examination: See above findings.      [place] Adult PT Treatment/Exercise - 03/23/18 0001      Ambulation/Gait   Ambulation/Gait  Yes    Ambulation/Gait Assistance  5: Supervision    Ambulation Distance (Feet)  60 Feet    Gait Pattern  Step-through pattern    Ambulation Surface  Level;Indoor    Gait Comments  Gait with RW and SPC in preparation for partial knee replacement       Knee/Hip Exercises: Aerobic   Recumbent Bike  L0 x 64min     Nustep  L2 x6 min       Knee/Hip Exercises: Seated   Hamstring Curl  2 sets;Both;10 reps    Hamstring Limitations  green  Knee/Hip Exercises: Supine   Bridges  Both;2 sets;15 reps    Bridges with [employer]  Strengthening;2 sets;10 reps    Other Supine Knee/Hip Exercises  ball squeeze x15     Other Supine Knee/Hip Exercises  Hip abd/add x5 then x7               PT Short Term Goals - 02/08/18 1349      PT SHORT TERM GOAL #1   Title  independent with intiial HEP    Status  Achieved        PT Long Term Goals - 03/23/18 1427      PT LONG TERM GOAL #1   Title  decrease pain in theAM 50%    Status  On-going      PT LONG TERM GOAL #2   Title  understand proper posture and body mechanics    Status  Achieved      PT LONG TERM GOAL #3   Title  decrease TUG test to 15 seconds    Status  Achieved             Plan - 03/23/18 1425    Clinical Impression Statement  Pt enters clinic reporting increase R knee pain. Pt able to ambulate with RW and SPC appropriately, Good strength with supine exercises just she does fatigue quickly.     PT Frequency  2x / week     PT Duration  8 weeks    PT Treatment/Interventions  ADLs/Self Care Home Management;Electrical Stimulation;Moist Heat;Therapeutic exercise;Therapeutic activities;Functional mobility training;Patient/family education;Manual techniques    PT Next Visit Plan  stregnth and flexibility for hips and low back, [place] D/C       Patient will benefit from skilled therapeutic intervention in order to improve the following deficits and impairments:  Abnormal gait, Decreased range of motion, Difficulty walking, Increased muscle spasms, Pain, Impaired flexibility  Visit Diagnosis: Acute left-sided low back pain with left-sided sciatica  Difficulty in walking, not elsewhere classified     Problem List Patient Active Problem List   Diagnosis Date Noted  . Malignant neoplasm of upper-outer quadrant of right breast in female, estrogen receptor positive ([place]) 06/29/2017  . Unspecified vitamin D deficiency 11/27/2013  . Hematuria 10/31/2013  . Physical exam, annual 08/02/2013  . Other and unspecified hyperlipidemia 07/31/2013  . Diabetes mellitus without complication ([place])   . Hypertension   . Arthritis     Scot Jun, PTA 03/23/2018, 2:27 PM  [place], [place], [address] Phone: [phone]   Fax:  [phone]  Name: Tylyn Stankovich MRN: [id number] Date of Birth: [birth year]

## 2018-03-29 ENCOUNTER — Encounter (HOSPITAL_COMMUNITY): Payer: Self-pay

## 2018-03-29 ENCOUNTER — Encounter: Payer: Self-pay | Admitting: Physical Therapy

## 2018-03-29 ENCOUNTER — Ambulatory Visit: Payer: Medicare PPO | Attending: Orthopedic Surgery | Admitting: Physical Therapy

## 2018-03-29 DIAGNOSIS — R262 Difficulty in walking, not elsewhere classified: Secondary | ICD-10-CM | POA: Diagnosis not present

## 2018-03-29 DIAGNOSIS — M5442 Lumbago with sciatica, left side: Secondary | ICD-10-CM

## 2018-03-29 NOTE — Therapy (Signed)
[place], [place], [address] Phone: [phone]   Fax:  [phone]  Physical Therapy Evaluation  Patient Details  Name: Anahis Furgeson MRN: [id number] Date of Birth: 82-06-1926 Referring Provider: Dorna Leitz   Encounter Date: 03/29/2018  PT End of Session - 03/29/18 1552    Visit Number  12    Date for PT Re-Evaluation  03/27/18    PT Start Time  1515    PT Stop Time  1555    PT Time Calculation (min)  40 min    Activity Tolerance  Patient tolerated treatment well    Behavior During Therapy  [facility] for tasks assessed/performed       Past Medical History:  Diagnosis Date  . Cancer [facility])    right breast  . Diabetes mellitus without complication ([place]) 6579   type 2  . Glaucoma    left eye  . Hematuria 10/31/2013  . History of external beam radiation therapy 07/21/17- 08/11/17   Right Breast 2.66 Gy X 16 fractions  . Hypertension   . Jaundice    in 1947  . Measles as a child  . Multinodular goiter   . Mumps as a child  . OA (osteoarthritis)    right knee, torn meniscus lateral and medial  . Other and unspecified hyperlipidemia 07/31/2013  . Physical exam, annual 08/02/2013   Sees Dr Trinna Post at [facility] Dr Farris Has, Retinal specialist Dermatolgy  . Thyroid nodule   . Wears glasses   . Wears partial dentures     Past Surgical History:  Procedure Laterality Date  . BREAST LUMPECTOMY WITH RADIOACTIVE SEED LOCALIZATION Right 06/14/2017   Procedure: [place] BREAST LUMPECTOMY WITH RADIOACTIVE SEED LOCALIZATION ERAS PATHWAY;  Surgeon: Jovita Kussmaul, MD;  Location: [facility];  Service: General;  Laterality: Right;  . COLONOSCOPY  09/2015  . EYE SURGERY  2009 and 2010   cataract both eyes  . EYE SURGERY  2012   laser to left eye, chalazion was removed with laser  . KNEE SURGERY Right    medial meniscus and lateral menicus  . MULTIPLE TOOTH EXTRACTIONS    . TONSILLECTOMY  84 yrs  old    There were no vitals filed for this visit.   Subjective Assessment - 03/29/18 1519    Subjective  "Pretty good" "The knee is hurting and the hip is hurting, until I do my exercises and my hip does not hurt anymore"    Currently in Pain?  Yes    Pain Score  6     Pain Location  Knee    Pain Orientation  Right                    Objective measurements completed on examination: See above findings.      [place] Adult PT Treatment/Exercise - 03/29/18 0001      Knee/Hip Exercises: Aerobic   Recumbent Bike  L0 x 31mn     Nustep  L2 x6 min       Knee/Hip Exercises: Seated   Ball Squeeze  2x10 3sec hold    Hamstring Curl  2 sets;Both;10 reps    Hamstring Limitations  green    Abduction/Adduction   Strengthening;Both;2 sets;10 reps mamual resistance       Knee/Hip Exercises: Supine   Bridges  Both;2 sets;15 reps;10 reps    Bridges with [employer] Strengthening;2 sets;10 reps    Bridges with [employer]  Both;1 set;10 reps    Straight Leg Raises  Both;1 set;10 reps    Other Supine Knee/Hip Exercises  ball squeeze x15; clam 2x15     Other Supine Knee/Hip Exercises  Hip abd/add x5 then x7               PT Short Term Goals - 03/29/18 1554      PT SHORT TERM GOAL #1   Title  independent with intiial HEP    Status  Achieved        PT Long Term Goals - 03/29/18 1554      PT LONG TERM GOAL #1   Title  decrease pain in theAM 50%    Status  Partially Met      PT LONG TERM GOAL #2   Title  understand proper posture and body mechanics    Status  Achieved      PT LONG TERM GOAL #3   Title  decrease TUG test to 15 seconds    Status  Achieved             Plan - 03/29/18 1555    Clinical Impression Statement  Most goals met, she reports hip pain in the AM that goes away after she does her exercises.     Rehab Potential  Good    PT Frequency  2x / week    PT Duration  8 weeks    PT Treatment/Interventions  ADLs/Self Care Home  Management;Electrical Stimulation;Moist Heat;Therapeutic exercise;Therapeutic activities;Functional mobility training;Patient/family education;Manual techniques    PT Next Visit Plan  D/C PT       Patient will benefit from skilled therapeutic intervention in order to improve the following deficits and impairments:  Abnormal gait, Decreased range of motion, Difficulty walking, Increased muscle spasms, Pain, Impaired flexibility  Visit Diagnosis: Acute left-sided low back pain with left-sided sciatica  Difficulty in walking, not elsewhere classified     Problem List Patient Active Problem List   Diagnosis Date Noted  . Malignant neoplasm of upper-outer quadrant of right breast in female, estrogen receptor positive ([place]) 06/29/2017  . Unspecified vitamin D deficiency 11/27/2013  . Hematuria 10/31/2013  . Physical exam, annual 08/02/2013  . Other and unspecified hyperlipidemia 07/31/2013  . Diabetes mellitus without complication ([place])   . Hypertension   . Arthritis    PHYSICAL THERAPY DISCHARGE SUMMARY  Visits from Start of Care: 12 Plan: Patient agrees to discharge.  Patient goals were partially met. Patient is being discharged due to being pleased with the current functional level.  ?????      Scot Jun, PTA 03/29/2018, 3:58 PM  [place], [place], [address] Phone: [phone]   Fax:  [phone]  Name: Enza Shone MRN: [id number] Date of Birth: [birth year]

## 2018-03-29 NOTE — Pre-Procedure Instructions (Signed)
EKG 06/14/17 in epic

## 2018-03-29 NOTE — Patient Instructions (Signed)
Your procedure is scheduled on: Friday, April 08, 2018   Surgery Time:  10:15AM-12:44PM   Report to [facility] Main  Entrance    Report to admitting at 7:45 AM   Call this number if you have problems the morning of surgery [phone]   Do not eat food or drink liquids :After Midnight.   Do NOT smoke after Midnight   Take these medicines the morning of surgery with A SIP OF WATER: Amlodipine   DO NOT TAKE ANY DIABETIC MEDICATIONS DAY OF YOUR SURGERY                               You may not have any metal on your body including hair pins, jewelry, and body piercings             Do not wear make-up, lotions, powders, perfumes/cologne, or deodorant             Do not wear nail polish.  Do not shave  48 hours prior to surgery.             Do not bring valuables to the hospital. [place].   Contacts, dentures or bridgework may not be worn into surgery.   Leave suitcase in the car. After surgery it may be brought to your room.   Special Instructions: Bring a copy of your healthcare power of attorney and living will documents         the day of surgery if you haven't scanned them in before.              Please read over the following fact sheets you were given:  [facility] - Preparing for Surgery Before surgery, you can play an important role.  Because skin is not sterile, your skin needs to be as free of germs as possible.  You can reduce the number of germs on your skin by washing with CHG (chlorahexidine gluconate) soap before surgery.  CHG is an antiseptic cleaner which kills germs and bonds with the skin to continue killing germs even after washing. Please DO NOT use if you have an allergy to CHG or antibacterial soaps.  If your skin becomes reddened/irritated stop using the CHG and inform your nurse when you arrive at Short Stay. Do not shave (including legs and underarms) for at least 48 hours prior to the first  CHG shower.  You may shave your face/neck.  Please follow these instructions carefully:  1.  Shower with CHG Soap the night before surgery and the  morning of surgery.  2.  If you choose to wash your hair, wash your hair first as usual with your normal  shampoo.  3.  After you shampoo, rinse your hair and body thoroughly to remove the shampoo.                             4.  Use CHG as you would any other liquid soap.  You can apply chg directly to the skin and wash.  Gently with a scrungie or clean washcloth.  5.  Apply the CHG Soap to your body ONLY FROM THE NECK DOWN.   Do   not use on face/ open  Wound or open sores. Avoid contact with eyes, ears mouth and   genitals (private parts).                       Wash face,  Genitals (private parts) with your normal soap.             6.  Wash thoroughly, paying special attention to the area where your    surgery  will be performed.  7.  Thoroughly rinse your body with warm water from the neck down.  8.  DO NOT shower/wash with your normal soap after using and rinsing off the CHG Soap.                9.  Pat yourself dry with a clean towel.            10.  Wear clean pajamas.            11.  Place clean sheets on your bed the night of your first shower and do not  sleep with pets. Day of Surgery : Do not apply any lotions/deodorants the morning of surgery.  Please wear clean clothes to the hospital/surgery center.  FAILURE TO FOLLOW THESE INSTRUCTIONS MAY RESULT IN THE CANCELLATION OF YOUR SURGERY  PATIENT SIGNATURE_________________________________  NURSE SIGNATURE__________________________________  ________________________________________________________________________   Adam Phenix  An incentive spirometer is a tool that can help keep your lungs clear and active. This tool measures how well you are filling your lungs with each breath. Taking long deep breaths may help reverse or decrease the chance of  developing breathing (pulmonary) problems (especially infection) following:  A long period of time when you are unable to move or be active. BEFORE THE PROCEDURE   If the spirometer includes an indicator to show your best effort, your nurse or respiratory therapist will set it to a desired goal.  If possible, sit up straight or lean slightly forward. Try not to slouch.  Hold the incentive spirometer in an upright position. INSTRUCTIONS FOR USE  1. Sit on the edge of your bed if possible, or sit up as far as you can in bed or on a chair. 2. Hold the incentive spirometer in an upright position. 3. Breathe out normally. 4. Place the mouthpiece in your mouth and seal your lips tightly around it. 5. Breathe in slowly and as deeply as possible, raising the piston or the ball toward the top of the column. 6. Hold your breath for 3-5 seconds or for as long as possible. Allow the piston or ball to fall to the bottom of the column. 7. Remove the mouthpiece from your mouth and breathe out normally. 8. Rest for a few seconds and repeat Steps 1 through 7 at least 10 times every 1-2 hours when you are awake. Take your time and take a few normal breaths between deep breaths. 9. The spirometer may include an indicator to show your best effort. Use the indicator as a goal to work toward during each repetition. 10. After each set of 10 deep breaths, practice coughing to be sure your lungs are clear. If you have an incision (the cut made at the time of surgery), support your incision when coughing by placing a pillow or rolled up towels firmly against it. Once you are able to get out of bed, walk around indoors and cough well. You may stop using the incentive spirometer when instructed by your caregiver.  RISKS AND COMPLICATIONS  Take your time  so you do not get dizzy or light-headed.  If you are in pain, you may need to take or ask for pain medication before doing incentive spirometry. It is harder to take a  deep breath if you are having pain. AFTER USE  Rest and breathe slowly and easily.  It can be helpful to keep track of a log of your progress. Your caregiver can provide you with a simple table to help with this. If you are using the spirometer at home, follow these instructions: [place] IF:   You are having difficultly using the spirometer.  You have trouble using the spirometer as often as instructed.  Your pain medication is not giving enough relief while using the spirometer.  You develop fever of 100.5 F (38.1 C) or higher. SEEK IMMEDIATE MEDICAL CARE IF:   You cough up bloody sputum that had not been present before.  You develop fever of 102 F (38.9 C) or greater.  You develop worsening pain at or near the incision site. MAKE SURE YOU:   Understand these instructions.  Will watch your condition.  Will get help right away if you are not doing well or get worse. Document Released: 01/25/2007 Document Revised: 12/07/2011 Document Reviewed: 03/28/2007 ExitCare Patient Information 2014 ExitCare, [place].   ________________________________________________________________________  WHAT IS A BLOOD TRANSFUSION? Blood Transfusion Information  A transfusion is the replacement of blood or some of its parts. Blood is made up of multiple cells which provide different functions.  Red blood cells carry oxygen and are used for blood loss replacement.  White blood cells fight against infection.  Platelets control bleeding.  Plasma helps clot blood.  Other blood products are available for specialized needs, such as hemophilia or other clotting disorders. BEFORE THE TRANSFUSION  Who gives blood for transfusions?   Healthy volunteers who are fully evaluated to make sure their blood is safe. This is blood bank blood. Transfusion therapy is the safest it has ever been in the practice of medicine. Before blood is taken from a donor, a complete history is taken to make  sure that person has no history of diseases nor engages in risky social behavior (examples are intravenous drug use or sexual activity with multiple partners). The donor's travel history is screened to minimize risk of transmitting infections, such as malaria. The donated blood is tested for signs of infectious diseases, such as HIV and hepatitis. The blood is then tested to be sure it is compatible with you in order to minimize the chance of a transfusion reaction. If you or a relative donates blood, this is often done in anticipation of surgery and is not appropriate for emergency situations. It takes many days to process the donated blood. RISKS AND COMPLICATIONS Although transfusion therapy is very safe and saves many lives, the main dangers of transfusion include:   Getting an infectious disease.  Developing a transfusion reaction. This is an allergic reaction to something in the blood you were given. Every precaution is taken to prevent this. The decision to have a blood transfusion has been considered carefully by your caregiver before blood is given. Blood is not given unless the benefits outweigh the risks. AFTER THE TRANSFUSION  Right after receiving a blood transfusion, you will usually feel much better and more energetic. This is especially true if your red blood cells have gotten low (anemic). The transfusion raises the level of the red blood cells which carry oxygen, and this usually causes an energy increase.  The  nurse administering the transfusion will monitor you carefully for complications. HOME CARE INSTRUCTIONS  No special instructions are needed after a transfusion. You may find your energy is better. Speak with your caregiver about any limitations on activity for underlying diseases you may have. SEEK MEDICAL CARE IF:   Your condition is not improving after your transfusion.  You develop redness or irritation at the intravenous (IV) site. SEEK IMMEDIATE MEDICAL CARE IF:   Any of the following symptoms occur over the next 12 hours:  Shaking chills.  You have a temperature by mouth above 102 F (38.9 C), not controlled by medicine.  Chest, back, or muscle pain.  People around you feel you are not acting correctly or are confused.  Shortness of breath or difficulty breathing.  Dizziness and fainting.  You get a rash or develop hives.  You have a decrease in urine output.  Your urine turns a dark color or changes to pink, red, or brown. Any of the following symptoms occur over the next 10 days:  You have a temperature by mouth above 102 F (38.9 C), not controlled by medicine.  Shortness of breath.  Weakness after normal activity.  The white part of the eye turns yellow (jaundice).  You have a decrease in the amount of urine or are urinating less often.  Your urine turns a dark color or changes to pink, red, or brown. Document Released: 09/11/2000 Document Revised: 12/07/2011 Document Reviewed: 04/30/2008 ExitCare Patient Information 2014 ExitCare, [place].  _______________________________________________________________________ How to Manage Your Diabetes Before and After Surgery  Why is it important to control my blood sugar before and after surgery? . Improving blood sugar levels before and after surgery helps healing and can limit problems. . A way of improving blood sugar control is eating a healthy diet by: o  Eating less sugar and carbohydrates o  Increasing activity/exercise o  Talking with your doctor about reaching your blood sugar goals . High blood sugars (greater than 180 mg/dL) can raise your risk of infections and slow your recovery, so you will need to focus on controlling your diabetes during the weeks before surgery. . Make sure that the doctor who takes care of your diabetes knows about your planned surgery including the date and location.

## 2018-04-01 ENCOUNTER — Encounter (HOSPITAL_COMMUNITY)
Admission: RE | Admit: 2018-04-01 | Discharge: 2018-04-01 | Disposition: A | Discharge status: Home / Self Care | Payer: Medicare PPO | Source: Ambulatory Visit | Attending: Orthopedic Surgery | Admitting: Orthopedic Surgery

## 2018-04-01 ENCOUNTER — Other Ambulatory Visit: Payer: Self-pay

## 2018-04-01 ENCOUNTER — Ambulatory Visit (HOSPITAL_COMMUNITY)
Admission: RE | Admit: 2018-04-01 | Discharge: 2018-04-01 | Disposition: A | Discharge status: Home / Self Care | Payer: Medicare PPO | Source: Ambulatory Visit | Attending: Orthopedic Surgery | Admitting: Orthopedic Surgery

## 2018-04-01 ENCOUNTER — Encounter (HOSPITAL_COMMUNITY): Payer: Self-pay

## 2018-04-01 DIAGNOSIS — Z01818 Encounter for other preprocedural examination: Secondary | ICD-10-CM

## 2018-04-01 DIAGNOSIS — M1711 Unilateral primary osteoarthritis, right knee: Secondary | ICD-10-CM | POA: Diagnosis not present

## 2018-04-01 DIAGNOSIS — I700 Atherosclerosis of aorta: Secondary | ICD-10-CM | POA: Insufficient documentation

## 2018-04-01 DIAGNOSIS — Z01812 Encounter for preprocedural laboratory examination: Secondary | ICD-10-CM | POA: Insufficient documentation

## 2018-04-01 DIAGNOSIS — J984 Other disorders of lung: Secondary | ICD-10-CM | POA: Insufficient documentation

## 2018-04-01 DIAGNOSIS — R0989 Other specified symptoms and signs involving the circulatory and respiratory systems: Secondary | ICD-10-CM | POA: Diagnosis not present

## 2018-04-01 HISTORY — DX: Nontoxic multinodular goiter: E04.2

## 2018-04-01 HISTORY — DX: Unspecified osteoarthritis, unspecified site: M19.90

## 2018-04-01 HISTORY — DX: Atherosclerosis of aorta: I70.0

## 2018-04-01 LAB — URINALYSIS, ROUTINE W REFLEX MICROSCOPIC
BACTERIA UA: NONE SEEN
Bilirubin Urine: NEGATIVE
Glucose, UA: NEGATIVE mg/dL
Ketones, ur: NEGATIVE mg/dL
Leukocytes, UA: NEGATIVE
NITRITE: NEGATIVE
PH: 7 (ref 5.0–8.0)
Protein, ur: NEGATIVE mg/dL
SPECIFIC GRAVITY, URINE: 1.004 — AB (ref 1.005–1.030)

## 2018-04-01 LAB — CBC WITH DIFFERENTIAL/PLATELET
BASOS ABS: 0 10*3/uL (ref 0.0–0.1)
BASOS PCT: 0 %
EOS PCT: 1 %
Eosinophils Absolute: 0 10*3/uL (ref 0.0–0.7)
HCT: 37.5 % (ref 36.0–46.0)
Hemoglobin: 12.6 g/dL (ref 12.0–15.0)
Lymphocytes Relative: 21 %
Lymphs Abs: 1.3 10*3/uL (ref 0.7–4.0)
MCH: 31 pg (ref 26.0–34.0)
MCHC: 33.6 g/dL (ref 30.0–36.0)
MCV: 92.4 fL (ref 78.0–100.0)
MONO ABS: 0.5 10*3/uL (ref 0.1–1.0)
Monocytes Relative: 8 %
NEUTROS ABS: 4.4 10*3/uL (ref 1.7–7.7)
Neutrophils Relative %: 70 %
PLATELETS: 245 10*3/uL (ref 150–400)
RBC: 4.06 MIL/uL (ref 3.87–5.11)
RDW: 13.6 % (ref 11.5–15.5)
WBC: 6.2 10*3/uL (ref 4.0–10.5)

## 2018-04-01 LAB — PROTIME-INR
INR: 0.98
Prothrombin Time: 12.9 seconds (ref 11.4–15.2)

## 2018-04-01 LAB — COMPREHENSIVE METABOLIC PANEL
ALBUMIN: 4.1 g/dL (ref 3.5–5.0)
ALT: 19 U/L (ref 0–44)
AST: 25 U/L (ref 15–41)
Alkaline Phosphatase: 63 U/L (ref 38–126)
Anion gap: 11 (ref 5–15)
BUN: 22 mg/dL (ref 8–23)
CHLORIDE: 97 mmol/L — AB (ref 98–111)
CO2: 28 mmol/L (ref 22–32)
Calcium: 9.5 mg/dL (ref 8.9–10.3)
Creatinine, Ser: 1.07 mg/dL — ABNORMAL HIGH (ref 0.44–1.00)
GFR calc Af Amer: 51 mL/min — ABNORMAL LOW (ref >60)
GFR calc non Af Amer: 44 mL/min — ABNORMAL LOW (ref >60)
GLUCOSE: 112 mg/dL — AB (ref 70–99)
Potassium: 4.8 mmol/L (ref 3.5–5.1)
Sodium: 136 mmol/L (ref 135–145)
Total Bilirubin: 1 mg/dL (ref 0.3–1.2)
Total Protein: 7.9 g/dL (ref 6.5–8.1)

## 2018-04-01 LAB — HEMOGLOBIN A1C
Hgb A1c MFr Bld: 6.5 % — ABNORMAL HIGH (ref 4.8–5.6)
Mean Plasma Glucose: 139.85 mg/dL

## 2018-04-01 LAB — APTT: APTT: 32 s (ref 24–36)

## 2018-04-01 LAB — SURGICAL PCR SCREEN
MRSA, PCR: NEGATIVE
Staphylococcus aureus: NEGATIVE

## 2018-04-01 LAB — GLUCOSE, CAPILLARY: Glucose-Capillary: 108 mg/dL — ABNORMAL HIGH (ref 70–99)

## 2018-04-01 LAB — ABO/RH: ABO/RH(D): AB POS

## 2018-04-01 NOTE — Pre-Procedure Instructions (Signed)
The following results from 04/01/2018 were faxed to Dr. Berenice Primas via epic: CMP UA [id number]  CXR

## 2018-04-07 MED ORDER — BUPIVACAINE LIPOSOME 1.3 % IJ SUSP
20.0000 mL | Freq: Once | INTRAMUSCULAR | Status: DC
  Filled 2018-04-07: qty 20

## 2018-04-07 NOTE — H&P (Addendum)
Unicompartmental KNEE ADMISSION H&P  Patient is being admitted for right unicompartmental  knee arthroplasty.  Subjective:  Chief Complaint:right knee pain.  HPI: Kristen Hill, 84 y.o. female, has a history of pain and functional disability in the right knee due to arthritis and has failed non-surgical conservative treatments for greater than 12 weeks to includeNSAID's and/or analgesics, corticosteriod injections, viscosupplementation injections, weight reduction as appropriate and activity modification.  Onset of symptoms was gradual, starting 4 years ago with gradually worsening course since that time. The patient noted no past surgery on the right knee(s).  Patient currently rates pain in the right knee(s) at 7 out of 10 with activity. Patient has night pain, worsening of pain with activity and weight bearing, pain that interferes with activities of daily living, pain with passive range of motion and joint swelling.  Patient has evidence of subchondral sclerosis, periarticular osteophytes and joint space narrowing by imaging studies. This patient has had Failure of all reasonable conservative care. There is no active infection.  Patient Active Problem List   Diagnosis Date Noted  . Malignant neoplasm of upper-outer quadrant of right breast in female, estrogen receptor positive ([place]) 06/29/2017  . Unspecified vitamin D deficiency 11/27/2013  . Hematuria 10/31/2013  . Physical exam, annual 08/02/2013  . Other and unspecified hyperlipidemia 07/31/2013  . Diabetes mellitus without complication ([place])   . Hypertension   . Arthritis    Past Medical History:  Diagnosis Date  . Aortic atherosclerosis ([place])   . Cancer [facility])    right breast  . Diabetes mellitus without complication ([place]) 7026   type 2  . Glaucoma    left eye  . Hematuria 10/31/2013  . History of external beam radiation therapy 07/21/17- 08/11/17   Right Breast 2.66 Gy X 16 fractions  . Hypertension   . Jaundice    in 1947  . Jaundice 1946  . Measles as a child  . Multinodular goiter   . Mumps as a child  . OA (osteoarthritis)    right knee, torn meniscus lateral and medial  . Other and unspecified hyperlipidemia 07/31/2013  . Physical exam, annual 08/02/2013   Sees Dr Trinna Post at [facility], Retinal specialist Dermatolgy  . Thyroid nodule   . Wears glasses   . Wears partial dentures     Past Surgical History:  Procedure Laterality Date  . BREAST LUMPECTOMY WITH RADIOACTIVE SEED LOCALIZATION Right 06/14/2017   Procedure: Micro BREAST LUMPECTOMY WITH RADIOACTIVE SEED LOCALIZATION ERAS PATHWAY;  Surgeon: Jovita Kussmaul, MD;  Location: [place];  Service: General;  Laterality: Right;  . COLONOSCOPY  09/2015  . EYE SURGERY  2009 and 2010   cataract both eyes  . EYE SURGERY  2012   laser to left eye, chalazion was removed with laser  . KNEE SURGERY Right    medial meniscus and lateral menicus  . MULTIPLE TOOTH EXTRACTIONS    . TONSILLECTOMY  84 yrs old    Current Facility-Administered Medications  Medication Dose Route Frequency Provider Last Rate Last Dose  . [START ON 04/08/2018] bupivacaine liposome (EXPAREL) 1.3 % injection 266 mg  20 mL Other Once Dorna Leitz, MD       Current Outpatient Medications  Medication Sig Dispense Refill Last Dose  . amLODipine (NORVASC) 5 MG tablet Take 1 tablet (5 mg total) by mouth daily. 90 tablet 1 Taking  . bimatoprost (LUMIGAN) 0.01 % SOLN Place 1 drop into the left eye at bedtime.    Taking  .  EPINEPHrine 0.3 mg/0.3 mL IJ SOAJ injection Inject 0.3 mg into the skin once.    Taking  . ferrous sulfate 325 (65 FE) MG tablet Take 325 mg by mouth every other day.     . metFORMIN (GLUCOPHAGE) 500 MG tablet Take 1 tablet (500 mg total) by mouth 2 (two) times daily with a meal. 180 tablet 1 Taking  . Multiple Vitamin (MULTIVITAMIN WITH MINERALS) TABS tablet Take 1 tablet by mouth daily.    Taking  . VITAMIN D, ERGOCALCIFEROL, PO Take 1,000 Units by  mouth daily.    Taking  . anastrozole (ARIMIDEX) 1 MG tablet Take 1 tablet (1 mg total) daily by mouth. (Patient not taking: Reported on 09/14/2017) 90 tablet 3 Not Taking at Unknown time  . glucose blood (BAYER CONTOUR TEST) test strip Check blood sugars q am and prn (Patient not taking: Reported on 03/25/2018) 100 each 5 Not Taking at Unknown time  . MICROLET LANCETS MISC Check BS q am and prn (Patient not taking: Reported on 03/25/2018) 100 each 5 Not Taking at Unknown time   Allergies  Allergen Reactions  . Bee Venom Anaphylaxis  . Sulfa Antibiotics Rash    Social History   Tobacco Use  . Smoking status: Former Smoker    Packs/day: 0.10    Years: 1.00    Pack years: 0.10    Types: Cigarettes    Last attempt to quit: 09/28/1950    Years since quitting: 67.5  . Smokeless tobacco: Never Used  Substance Use Topics  . Alcohol use: No    Family History  Problem Relation Age of Onset  . Cancer Mother        ovarian  . Breast cancer Mother   . Diabetes Father   . Kidney disease Father      ROS ROS: I have reviewed the patient's review of systems thoroughly and there are no positive responses as relates to the HPI. Objective:  Physical Exam  Vital signs in last 24 hours:    Vitals:   04/08/18 0759  BP: (!) 147/72  Pulse: 76  Resp: 18  Temp: 98.2 F (36.8 C)  SpO2: 100%    Well-developed well-nourished patient in no acute distress. Alert and oriented x3 HEENT:within normal limits Cardiac: Regular rate and rhythm Pulmonary: Lungs clear to auscultation Abdomen: Soft and nontender.  Normal active bowel sounds  Musculoskeletal: (Right knee: Painful range of motion.  Limited range of motion.  No instability.  Obvious varus malalignment.) Labs: Recent Results (from the past 2160 hour(s))  Glucose, capillary     Status: Abnormal   Collection Time: 04/01/18 10:56 AM  Result Value Ref Range   Glucose-Capillary 108 (H) 70 - 99 mg/dL  APTT     Status: None   Collection  Time: 04/01/18 11:45 AM  Result Value Ref Range   aPTT 32 24 - 36 seconds    Comment: Performed at [facility], [place] [address], [place], [place] [address]  CBC WITH DIFFERENTIAL     Status: None   Collection Time: 04/01/18 11:45 AM  Result Value Ref Range   WBC 6.2 4.0 - 10.5 K/uL   RBC 4.06 3.87 - 5.11 MIL/uL   Hemoglobin 12.6 12.0 - 15.0 g/dL   HCT 37.5 36.0 - 46.0 %   MCV 92.4 78.0 - 100.0 fL   MCH 31.0 26.0 - 34.0 pg   MCHC 33.6 30.0 - 36.0 g/dL   RDW 13.6 11.5 - 15.5 %   Platelets  245 150 - 400 K/uL   Neutrophils Relative % 70 %   Neutro Abs 4.4 1.7 - 7.7 K/uL   Lymphocytes Relative 21 %   Lymphs Abs 1.3 0.7 - 4.0 K/uL   Monocytes Relative 8 %   Monocytes Absolute 0.5 0.1 - 1.0 K/uL   Eosinophils Relative 1 %   Eosinophils Absolute 0.0 0.0 - 0.7 K/uL   Basophils Relative 0 %   Basophils Absolute 0.0 0.0 - 0.1 K/uL    Comment: Performed at [facility], [place] [address] [place], [place] [address]  Comprehensive metabolic panel     Status: Abnormal   Collection Time: 04/01/18 11:45 AM  Result Value Ref Range   Sodium 136 135 - 145 mmol/L   Potassium 4.8 3.5 - 5.1 mmol/L   Chloride 97 (L) 98 - 111 mmol/L    Comment: Please note change in reference range.   CO2 28 22 - 32 mmol/L   Glucose, Bld 112 (H) 70 - 99 mg/dL    Comment: Please note change in reference range.   BUN 22 8 - 23 mg/dL    Comment: Please note change in reference range.   Creatinine, Ser 1.07 (H) 0.44 - 1.00 mg/dL   Calcium 9.5 8.9 - 10.3 mg/dL   Total Protein 7.9 6.5 - 8.1 g/dL   Albumin 4.1 3.5 - 5.0 g/dL   AST 25 15 - 41 U/L   ALT 19 0 - 44 U/L    Comment: Please note change in reference range.   Alkaline Phosphatase 63 38 - 126 U/L   Total Bilirubin 1.0 0.3 - 1.2 mg/dL   GFR calc non Af Amer 44 (L) >60 mL/min   GFR calc Af Amer 51 (L) >60 mL/min    Comment: (NOTE) The eGFR has been calculated using the CKD EPI equation. This calculation has not been  validated in all clinical situations. eGFR's persistently <60 mL/min signify possible Chronic Kidney Disease.    Anion gap 11 5 - 15    Comment: Performed at [facility], [place] [address] [place], [place] [address]  Protime-INR     Status: None   Collection Time: 04/01/18 11:45 AM  Result Value Ref Range   Prothrombin Time 12.9 11.4 - 15.2 seconds   INR 0.98     Comment: Performed at [facility], [place] [address], [place], [place] [address]  Hemoglobin A1c     Status: Abnormal   Collection Time: 04/01/18 11:45 AM  Result Value Ref Range   Hgb A1c MFr Bld 6.5 (H) 4.8 - 5.6 %    Comment: (NOTE) Pre diabetes:          5.7%-6.4% Diabetes:              >6.4% Glycemic control for   <7.0% adults with diabetes    Mean Plasma Glucose 139.85 mg/dL    Comment: Performed at [place] [address], [place], [place] [address]  Type and screen Order type and screen if day of surgery is less than 15 days from draw of preadmission visit or order morning of surgery if day of surgery is greater than 6 days from preadmission visit.     Status: None   Collection Time: 04/01/18 11:54 AM  Result Value Ref Range   ABO/RH(D) AB POS    Antibody Screen NEG    Sample Expiration 04/15/2018    Extend sample reason      NO TRANSFUSIONS OR  PREGNANCY IN THE PAST 3 MONTHS Performed at [place] [address] [place], [place] [address]   Urinalysis, Routine w reflex microscopic     Status: Abnormal   Collection Time: 04/01/18 11:54 AM  Result Value Ref Range   Color, Urine YELLOW YELLOW   APPearance CLEAR CLEAR   Specific Gravity, Urine 1.004 (L) 1.005 - 1.030   pH 7.0 5.0 - 8.0   Glucose, UA NEGATIVE NEGATIVE mg/dL   Hgb urine dipstick SMALL (A) NEGATIVE   Bilirubin Urine NEGATIVE NEGATIVE   Ketones, ur NEGATIVE NEGATIVE mg/dL   Protein, ur NEGATIVE NEGATIVE mg/dL   Nitrite NEGATIVE NEGATIVE   Leukocytes, UA NEGATIVE NEGATIVE    RBC / HPF 0-5 0 - 5 RBC/hpf   WBC, UA 0-5 0 - 5 WBC/hpf   Bacteria, UA NONE SEEN NONE SEEN   Squamous Epithelial / LPF 0-5 0 - 5    Comment: Performed at [facility], [place] [address] [place], [place] [address]  Surgical pcr screen     Status: None   Collection Time: 04/01/18 11:54 AM  Result Value Ref Range   MRSA, PCR NEGATIVE NEGATIVE   Staphylococcus aureus NEGATIVE NEGATIVE    Comment: (NOTE) The Xpert SA Assay (FDA approved for NASAL specimens in patients 21 years of age and older), is one component of a comprehensive surveillance program. It is not intended to diagnose infection nor to guide or monitor treatment. Performed at [facility], [place] [address], [place], [place] [address]   ABO/Rh     Status: None   Collection Time: 04/01/18 11:54 AM  Result Value Ref Range   ABO/RH(D)      AB POS Performed at [facility], [place] [address], [place], [place] [address]     Estimated body mass index is 26.85 kg/m as calculated from the following:   Height as of 04/01/18: 5' 1"  (1.549 m).   Weight as of 04/01/18: 64.5 kg (142 lb 2 oz).   Imaging Review Plain radiographs demonstrate severe degenerative joint disease of the right knee with isolated medial compartment arthritis(s). The overall alignment ismild varus. The bone quality appears to be fair for age and reported activity level.   Preoperative templating of the joint replacement has been completed, documented, and submitted to the Operating Room personnel in order to optimize intra-operative equipment management.       Assessment/Plan:  End stage arthritis, right knee   The patient history, physical examination, clinical judgment of the provider and imaging studies are consistent with end stage degenerative joint disease of the right knee(s) and unicompartmental knee arthroplasty is deemed medically necessary. The treatment options including medical management,  injection therapy arthroscopy and arthroplasty were discussed at length. The risks and benefits of total knee arthroplasty were presented and reviewed. The risks due to aseptic loosening, infection, stiffness, patella tracking problems, thromboembolic complications and other imponderables were discussed. The patient acknowledged the explanation, agreed to proceed with the plan and consent was signed. Patient is being admitted for inpatient treatment for surgery, pain control, PT, OT, prophylactic antibiotics, VTE prophylaxis, progressive ambulation and ADL's and discharge planning. The patient is planning to be discharged home with home health services.  There is been no change in the interval history and physical examination overnight.

## 2018-04-08 ENCOUNTER — Ambulatory Visit (HOSPITAL_COMMUNITY): Payer: Medicare PPO | Admitting: Certified Registered Nurse Anesthetist

## 2018-04-08 ENCOUNTER — Observation Stay (HOSPITAL_COMMUNITY)
Admission: RE | Admit: 2018-04-08 | Discharge: 2018-04-09 | Disposition: A | Discharge status: Home / Self Care | Payer: Medicare PPO | Source: Ambulatory Visit | Attending: Orthopedic Surgery | Admitting: Orthopedic Surgery

## 2018-04-08 ENCOUNTER — Other Ambulatory Visit: Payer: Self-pay

## 2018-04-08 ENCOUNTER — Encounter (HOSPITAL_COMMUNITY): Payer: Self-pay | Admitting: *Deleted

## 2018-04-08 ENCOUNTER — Encounter (HOSPITAL_COMMUNITY)
Admission: RE | Disposition: A | Discharge status: Home / Self Care | Payer: Self-pay | Source: Ambulatory Visit | Attending: Orthopedic Surgery

## 2018-04-08 DIAGNOSIS — E119 Type 2 diabetes mellitus without complications: Secondary | ICD-10-CM | POA: Insufficient documentation

## 2018-04-08 DIAGNOSIS — E785 Hyperlipidemia, unspecified: Secondary | ICD-10-CM | POA: Insufficient documentation

## 2018-04-08 DIAGNOSIS — G8918 Other acute postprocedural pain: Secondary | ICD-10-CM | POA: Diagnosis not present

## 2018-04-08 DIAGNOSIS — M1711 Unilateral primary osteoarthritis, right knee: Secondary | ICD-10-CM | POA: Diagnosis not present

## 2018-04-08 DIAGNOSIS — I10 Essential (primary) hypertension: Secondary | ICD-10-CM | POA: Diagnosis not present

## 2018-04-08 DIAGNOSIS — Z79899 Other long term (current) drug therapy: Secondary | ICD-10-CM | POA: Insufficient documentation

## 2018-04-08 DIAGNOSIS — Z87891 Personal history of nicotine dependence: Secondary | ICD-10-CM | POA: Diagnosis not present

## 2018-04-08 DIAGNOSIS — Z7984 Long term (current) use of oral hypoglycemic drugs: Secondary | ICD-10-CM | POA: Insufficient documentation

## 2018-04-08 HISTORY — PX: PARTIAL KNEE ARTHROPLASTY: SHX2174

## 2018-04-08 LAB — GLUCOSE, CAPILLARY
GLUCOSE-CAPILLARY: 94 mg/dL (ref 70–99)
GLUCOSE-CAPILLARY: 159 mg/dL — AB (ref 70–99)
GLUCOSE-CAPILLARY: 278 mg/dL — AB (ref 70–99)
Glucose-Capillary: 118 mg/dL — ABNORMAL HIGH (ref 70–99)
Glucose-Capillary: 272 mg/dL — ABNORMAL HIGH (ref 70–99)

## 2018-04-08 LAB — TYPE AND SCREEN
ABO/RH(D): AB POS
ANTIBODY SCREEN: NEGATIVE

## 2018-04-08 SURGERY — ARTHROPLASTY, KNEE, UNICOMPARTMENTAL
Anesthesia: Spinal | Site: Knee | Laterality: Right

## 2018-04-08 MED ORDER — PROPOFOL 10 MG/ML IV BOLUS
INTRAVENOUS | Status: AC
  Filled 2018-04-08: qty 20

## 2018-04-08 MED ORDER — METHOCARBAMOL 500 MG PO TABS
500.0000 mg | ORAL_TABLET | Freq: Four times a day (QID) | ORAL | Status: DC | PRN
  Administered 2018-04-09: 500 mg via ORAL
  Filled 2018-04-08: qty 1

## 2018-04-08 MED ORDER — ACETAMINOPHEN 325 MG PO TABS: 325.0000 mg | ORAL_TABLET | Freq: Four times a day (QID) | ORAL | Status: DC | PRN

## 2018-04-08 MED ORDER — BUPIVACAINE-EPINEPHRINE (PF) 0.5% -1:200000 IJ SOLN
INTRAMUSCULAR | Status: DC | PRN
  Administered 2018-04-08: 30 mL

## 2018-04-08 MED ORDER — SODIUM CHLORIDE 0.9 % IJ SOLN
INTRAMUSCULAR | Status: DC | PRN
  Administered 2018-04-08: 30 mL

## 2018-04-08 MED ORDER — FENTANYL CITRATE (PF) 100 MCG/2ML IJ SOLN
50.0000 ug | Freq: Once | INTRAMUSCULAR | Status: AC
  Administered 2018-04-08: 75 ug via INTRAVENOUS
  Filled 2018-04-08: qty 2

## 2018-04-08 MED ORDER — ONDANSETRON HCL 4 MG/2ML IJ SOLN: 4.0000 mg | Freq: Four times a day (QID) | INTRAMUSCULAR | Status: DC | PRN

## 2018-04-08 MED ORDER — METHOCARBAMOL 1000 MG/10ML IJ SOLN
500.0000 mg | Freq: Four times a day (QID) | INTRAVENOUS | Status: DC | PRN
  Filled 2018-04-08: qty 5

## 2018-04-08 MED ORDER — POLYETHYLENE GLYCOL 3350 17 G PO PACK: 17.0000 g | PACK | Freq: Every day | ORAL | Status: DC | PRN

## 2018-04-08 MED ORDER — DIPHENHYDRAMINE HCL 12.5 MG/5ML PO ELIX: 12.5000 mg | ORAL_SOLUTION | ORAL | Status: DC | PRN

## 2018-04-08 MED ORDER — TRANEXAMIC ACID 1000 MG/10ML IV SOLN
1000.0000 mg | Freq: Once | INTRAVENOUS | Status: AC
  Administered 2018-04-08: 1000 mg via INTRAVENOUS
  Filled 2018-04-08: qty 10
  Filled 2018-04-08: qty 1100

## 2018-04-08 MED ORDER — CEFAZOLIN SODIUM-DEXTROSE 2-4 GM/100ML-% IV SOLN
2.0000 g | Freq: Four times a day (QID) | INTRAVENOUS | Status: AC
  Administered 2018-04-08 (×2): 2 g via INTRAVENOUS
  Filled 2018-04-08 (×2): qty 100

## 2018-04-08 MED ORDER — CEFAZOLIN SODIUM-DEXTROSE 2-4 GM/100ML-% IV SOLN
2.0000 g | INTRAVENOUS | Status: AC
  Administered 2018-04-08: 2 g via INTRAVENOUS
  Filled 2018-04-08: qty 100

## 2018-04-08 MED ORDER — DEXAMETHASONE SODIUM PHOSPHATE 10 MG/ML IJ SOLN
10.0000 mg | Freq: Two times a day (BID) | INTRAMUSCULAR | Status: AC
  Administered 2018-04-08 – 2018-04-09 (×3): 10 mg via INTRAVENOUS
  Filled 2018-04-08 (×3): qty 1

## 2018-04-08 MED ORDER — SODIUM CHLORIDE 0.9 % IV SOLN
INTRAVENOUS | Status: DC
  Administered 2018-04-08: 100 mL/h via INTRAVENOUS
  Administered 2018-04-09: 03:00:00 via INTRAVENOUS

## 2018-04-08 MED ORDER — 0.9 % SODIUM CHLORIDE (POUR BTL) OPTIME
TOPICAL | Status: DC | PRN
  Administered 2018-04-08: 1000 mL

## 2018-04-08 MED ORDER — ACETAMINOPHEN 500 MG PO TABS
500.0000 mg | ORAL_TABLET | Freq: Four times a day (QID) | ORAL | Status: AC
  Administered 2018-04-08 – 2018-04-09 (×3): 500 mg via ORAL
  Filled 2018-04-08 (×4): qty 1

## 2018-04-08 MED ORDER — ALUM & MAG HYDROXIDE-SIMETH 200-200-20 MG/5ML PO SUSP: 30.0000 mL | ORAL | Status: DC | PRN

## 2018-04-08 MED ORDER — KETOROLAC TROMETHAMINE 15 MG/ML IJ SOLN: 15.0000 mg | Freq: Once | INTRAMUSCULAR | Status: DC

## 2018-04-08 MED ORDER — LACTATED RINGERS IV SOLN
INTRAVENOUS | Status: DC
  Administered 2018-04-08: 08:00:00 via INTRAVENOUS

## 2018-04-08 MED ORDER — AMLODIPINE BESYLATE 5 MG PO TABS
5.0000 mg | ORAL_TABLET | Freq: Every day | ORAL | Status: DC
  Administered 2018-04-09: 5 mg via ORAL
  Filled 2018-04-08: qty 1

## 2018-04-08 MED ORDER — INSULIN ASPART 100 UNIT/ML ~~LOC~~ SOLN
0.0000 [IU] | Freq: Three times a day (TID) | SUBCUTANEOUS | Status: DC
  Administered 2018-04-09 (×2): 3 [IU] via SUBCUTANEOUS

## 2018-04-08 MED ORDER — TIZANIDINE HCL 2 MG PO TABS: 2.0000 mg | ORAL_TABLET | Freq: Three times a day (TID) | ORAL | 0 refills | Status: DC | PRN

## 2018-04-08 MED ORDER — BISACODYL 5 MG PO TBEC: 5.0000 mg | DELAYED_RELEASE_TABLET | Freq: Every day | ORAL | Status: DC | PRN

## 2018-04-08 MED ORDER — PROPOFOL 500 MG/50ML IV EMUL
INTRAVENOUS | Status: DC | PRN
  Administered 2018-04-08: 75 ug/kg/min via INTRAVENOUS

## 2018-04-08 MED ORDER — STERILE WATER FOR IRRIGATION IR SOLN
Status: DC | PRN
  Administered 2018-04-08: 2000 mL

## 2018-04-08 MED ORDER — MAGNESIUM CITRATE PO SOLN: 1.0000 | Freq: Once | ORAL | Status: DC | PRN

## 2018-04-08 MED ORDER — ONDANSETRON HCL 4 MG/2ML IJ SOLN
INTRAMUSCULAR | Status: AC
  Filled 2018-04-08: qty 2

## 2018-04-08 MED ORDER — PROPOFOL 10 MG/ML IV BOLUS
INTRAVENOUS | Status: AC
  Filled 2018-04-08: qty 40

## 2018-04-08 MED ORDER — TRANEXAMIC ACID 1000 MG/10ML IV SOLN
1000.0000 mg | INTRAVENOUS | Status: AC
  Administered 2018-04-08: 1000 mg via INTRAVENOUS
  Filled 2018-04-08: qty 10
  Filled 2018-04-08: qty 1100

## 2018-04-08 MED ORDER — ONDANSETRON HCL 4 MG/2ML IJ SOLN
INTRAMUSCULAR | Status: DC | PRN
  Administered 2018-04-08: 4 mg via INTRAVENOUS

## 2018-04-08 MED ORDER — SODIUM CHLORIDE 0.9 % IR SOLN
Status: DC | PRN
  Administered 2018-04-08: 1000 mL

## 2018-04-08 MED ORDER — BUPIVACAINE-EPINEPHRINE (PF) 0.5% -1:200000 IJ SOLN
INTRAMUSCULAR | Status: AC
  Filled 2018-04-08: qty 30

## 2018-04-08 MED ORDER — INSULIN ASPART 100 UNIT/ML ~~LOC~~ SOLN
5.0000 [IU] | Freq: Once | SUBCUTANEOUS | Status: AC
  Administered 2018-04-08: 5 [IU] via SUBCUTANEOUS

## 2018-04-08 MED ORDER — CHLORHEXIDINE GLUCONATE 4 % EX LIQD: 60.0000 mL | Freq: Once | CUTANEOUS | Status: DC

## 2018-04-08 MED ORDER — GABAPENTIN 100 MG PO CAPS
100.0000 mg | ORAL_CAPSULE | Freq: Two times a day (BID) | ORAL | Status: DC
  Administered 2018-04-09: 100 mg via ORAL
  Filled 2018-04-08: qty 1

## 2018-04-08 MED ORDER — ONDANSETRON HCL 4 MG/2ML IJ SOLN: 4.0000 mg | Freq: Once | INTRAMUSCULAR | Status: DC | PRN

## 2018-04-08 MED ORDER — HYDROCODONE-ACETAMINOPHEN 5-325 MG PO TABS
1.0000 | ORAL_TABLET | ORAL | Status: DC | PRN
  Administered 2018-04-08 – 2018-04-09 (×3): 1 via ORAL
  Filled 2018-04-08 (×3): qty 1

## 2018-04-08 MED ORDER — PROPOFOL 10 MG/ML IV BOLUS
INTRAVENOUS | Status: DC | PRN
  Administered 2018-04-08: 20 mg via INTRAVENOUS

## 2018-04-08 MED ORDER — SODIUM CHLORIDE 0.9 % IJ SOLN
INTRAMUSCULAR | Status: AC
  Filled 2018-04-08: qty 50

## 2018-04-08 MED ORDER — DOCUSATE SODIUM 100 MG PO CAPS: 100.0000 mg | ORAL_CAPSULE | Freq: Two times a day (BID) | ORAL | 0 refills | Status: AC

## 2018-04-08 MED ORDER — BUPIVACAINE IN DEXTROSE 0.75-8.25 % IT SOLN
INTRATHECAL | Status: DC | PRN
  Administered 2018-04-08: 1.6 mL via INTRATHECAL

## 2018-04-08 MED ORDER — HYDROCODONE-ACETAMINOPHEN 5-325 MG PO TABS: 1.0000 | ORAL_TABLET | Freq: Four times a day (QID) | ORAL | 0 refills | Status: DC | PRN

## 2018-04-08 MED ORDER — FENTANYL CITRATE (PF) 100 MCG/2ML IJ SOLN
25.0000 ug | INTRAMUSCULAR | Status: DC | PRN
Start: 2018-04-08 — End: 2018-04-08

## 2018-04-08 MED ORDER — DOCUSATE SODIUM 100 MG PO CAPS
100.0000 mg | ORAL_CAPSULE | Freq: Two times a day (BID) | ORAL | Status: DC
  Administered 2018-04-08 – 2018-04-09 (×2): 100 mg via ORAL
  Filled 2018-04-08 (×2): qty 1

## 2018-04-08 MED ORDER — METFORMIN HCL 500 MG PO TABS
500.0000 mg | ORAL_TABLET | Freq: Two times a day (BID) | ORAL | Status: DC
  Administered 2018-04-08 – 2018-04-09 (×2): 500 mg via ORAL
  Filled 2018-04-08 (×2): qty 1

## 2018-04-08 MED ORDER — MEPERIDINE HCL 50 MG/ML IJ SOLN: 6.2500 mg | INTRAMUSCULAR | Status: DC | PRN

## 2018-04-08 MED ORDER — ONDANSETRON HCL 4 MG PO TABS: 4.0000 mg | ORAL_TABLET | Freq: Four times a day (QID) | ORAL | Status: DC | PRN

## 2018-04-08 MED ORDER — LATANOPROST 0.005 % OP SOLN
1.0000 [drp] | Freq: Every day | OPHTHALMIC | Status: DC
  Administered 2018-04-08: 1 [drp] via OPHTHALMIC
  Filled 2018-04-08: qty 2.5

## 2018-04-08 MED ORDER — BUPIVACAINE LIPOSOME 1.3 % IJ SUSP
INTRAMUSCULAR | Status: DC | PRN
  Administered 2018-04-08: 20 mL

## 2018-04-08 MED ORDER — ASPIRIN EC 325 MG PO TBEC: 325.0000 mg | DELAYED_RELEASE_TABLET | Freq: Two times a day (BID) | ORAL | 0 refills | Status: DC

## 2018-04-08 MED ORDER — ROPIVACAINE HCL 5 MG/ML IJ SOLN
INTRAMUSCULAR | Status: DC | PRN
  Administered 2018-04-08 (×6): 5 mL via PERINEURAL

## 2018-04-08 MED ORDER — MIDAZOLAM HCL 2 MG/2ML IJ SOLN
1.0000 mg | Freq: Once | INTRAMUSCULAR | Status: DC
  Filled 2018-04-08: qty 2

## 2018-04-08 MED ORDER — PHENYLEPHRINE 40 MCG/ML (10ML) SYRINGE FOR IV PUSH (FOR BLOOD PRESSURE SUPPORT)
PREFILLED_SYRINGE | INTRAVENOUS | Status: DC | PRN
  Administered 2018-04-08: 80 ug via INTRAVENOUS

## 2018-04-08 MED ORDER — ASPIRIN EC 325 MG PO TBEC
325.0000 mg | DELAYED_RELEASE_TABLET | Freq: Two times a day (BID) | ORAL | Status: DC
  Administered 2018-04-08 – 2018-04-09 (×2): 325 mg via ORAL
  Filled 2018-04-08 (×2): qty 1

## 2018-04-08 MED ORDER — SODIUM CHLORIDE 0.9 % IV SOLN
INTRAVENOUS | Status: DC | PRN
  Administered 2018-04-08: 50 ug/min via INTRAVENOUS

## 2018-04-08 MED ORDER — MORPHINE SULFATE (PF) 2 MG/ML IV SOLN: 0.5000 mg | INTRAVENOUS | Status: DC | PRN

## 2018-04-08 SURGICAL SUPPLY — 67 items
ARTICULAR SURFACE INSERTER TIP ×2 IMPLANT
BAG DECANTER FOR FLEXI CONT (MISCELLANEOUS) IMPLANT
BAG ZIPLOCK 12X15 (MISCELLANEOUS) IMPLANT
BANDAGE ACE 6X5 VEL STRL LF (GAUZE/BANDAGES/DRESSINGS) ×2 IMPLANT
BENZOIN TINCTURE PRP APPL 2/3 (GAUZE/BANDAGES/DRESSINGS) ×2 IMPLANT
BLADE SAW RECIPROCATING 77.5 (BLADE) ×2 IMPLANT
BLADE SAW SGTL 11.0X1.19X90.0M (BLADE) ×2 IMPLANT
BLADE SAW SGTL 13.0X1.19X90.0M (BLADE) IMPLANT
BOWL SMART MIX CTS (DISPOSABLE) ×2 IMPLANT
BUR OVAL CARBIDE 4.0 (BURR) IMPLANT
CEMENT HV SMART SET (Cement) ×2 IMPLANT
COVER SURGICAL LIGHT HANDLE (MISCELLANEOUS) ×2 IMPLANT
CUFF TOURN SGL QUICK 34 (TOURNIQUET CUFF) ×1
CUFF TRNQT CYL 34X4X40X1 (TOURNIQUET CUFF) ×1 IMPLANT
DECANTER SPIKE VIAL GLASS SM (MISCELLANEOUS) ×2 IMPLANT
DRSG ADAPTIC 3X8 NADH LF (GAUZE/BANDAGES/DRESSINGS) IMPLANT
DRSG AQUACEL AG ADV 3.5X 6 (GAUZE/BANDAGES/DRESSINGS) ×2 IMPLANT
DRSG PAD ABDOMINAL 8X10 ST (GAUZE/BANDAGES/DRESSINGS) IMPLANT
DURAPREP 26ML APPLICATOR (WOUND CARE) ×2 IMPLANT
ELECT REM PT RETURN 15FT ADLT (MISCELLANEOUS) ×2 IMPLANT
FEMUR PARTIAL CEM SZ2 RT MD KN (Knees) ×1 IMPLANT
GAUZE SPONGE 4X4 12PLY STRL (GAUZE/BANDAGES/DRESSINGS) IMPLANT
GLOVE BIO SURGEON STRL SZ7 (GLOVE) IMPLANT
GLOVE BIO SURGEON STRL SZ8 (GLOVE) IMPLANT
GLOVE BIOGEL PI IND STRL 6.5 (GLOVE) ×2 IMPLANT
GLOVE BIOGEL PI IND STRL 7.0 (GLOVE) ×1 IMPLANT
GLOVE BIOGEL PI IND STRL 7.5 (GLOVE) ×2 IMPLANT
GLOVE BIOGEL PI IND STRL 8 (GLOVE) ×2 IMPLANT
GLOVE BIOGEL PI INDICATOR 6.5 (GLOVE) ×2
GLOVE BIOGEL PI INDICATOR 7.0 (GLOVE) ×1
GLOVE BIOGEL PI INDICATOR 7.5 (GLOVE) ×2
GLOVE BIOGEL PI INDICATOR 8 (GLOVE) ×2
GLOVE ECLIPSE 7.5 STRL STRAW (GLOVE) ×4 IMPLANT
GLOVE SURG SS PI 6.0 STRL IVOR (GLOVE) ×2 IMPLANT
GLOVE SURG SS PI 6.5 STRL IVOR (GLOVE) ×2 IMPLANT
GOWN STRL REUS W/TWL LRG LVL3 (GOWN DISPOSABLE) ×2 IMPLANT
GOWN STRL REUS W/TWL XL LVL3 (GOWN DISPOSABLE) ×8 IMPLANT
HANDPIECE INTERPULSE COAX TIP (DISPOSABLE) ×1
HOOD PEEL AWAY FLYTE STAYCOOL (MISCELLANEOUS) ×6 IMPLANT
IMMOBILIZER KNEE 20 (SOFTGOODS) ×2
IMMOBILIZER KNEE 20 THIGH 36 (SOFTGOODS) ×1 IMPLANT
INSERTER TIP PARTIAL KNEE (Miscellaneous) ×2 IMPLANT
MANIFOLD NEPTUNE II (INSTRUMENTS) ×2 IMPLANT
NDL SAFETY ECLIPSE 18X1.5 (NEEDLE) IMPLANT
NEEDLE HYPO 18GX1.5 SHARP (NEEDLE)
NEEDLE HYPO 22GX1.5 SAFETY (NEEDLE) ×2 IMPLANT
PACK TOTAL KNEE CUSTOM (KITS) ×2 IMPLANT
PADDING CAST COTTON 6X4 STRL (CAST SUPPLIES) ×2 IMPLANT
PART FEMUR CEM SZ2 RT MED KNEE (Knees) ×2 IMPLANT
PART TIBIAL CEM SZ D RT KNEE (Orthopedic Implant) ×2 IMPLANT
PARTVIVACIT-E SZ D 8 KNEE (Orthopedic Implant) ×2 IMPLANT
POSITIONER SURGICAL ARM (MISCELLANEOUS) ×2 IMPLANT
PROSTHESIS KNEE VIVACTV E SZD8 (Orthopedic Implant) ×1 IMPLANT
SET HNDPC FAN SPRY TIP SCT (DISPOSABLE) ×1 IMPLANT
STRIP CLOSURE SKIN 1/2X4 (GAUZE/BANDAGES/DRESSINGS) ×2 IMPLANT
SUT MNCRL AB 4-0 PS2 18 (SUTURE) ×2 IMPLANT
SUT STRATAFIX 0 PDS 27 VIOLET (SUTURE)
SUT VIC AB 0 CT1 36 (SUTURE) ×2 IMPLANT
SUT VIC AB 1 CT1 36 (SUTURE) ×4 IMPLANT
SUT VIC AB 2-0 CT1 27 (SUTURE) ×2
SUT VIC AB 2-0 CT1 TAPERPNT 27 (SUTURE) ×2 IMPLANT
SUTURE STRATFX 0 PDS 27 VIOLET (SUTURE) IMPLANT
SYR 50ML LL SCALE MARK (SYRINGE) ×2 IMPLANT
SYR CONTROL 10ML LL (SYRINGE) ×4 IMPLANT
SYSTEM KNEE TIBIAL CEM SZ D RT (Orthopedic Implant) ×1 IMPLANT
WRAP KNEE MAXI GEL POST OP (GAUZE/BANDAGES/DRESSINGS) ×2 IMPLANT
YANKAUER SUCT BULB TIP 10FT TU (MISCELLANEOUS) ×2 IMPLANT

## 2018-04-08 NOTE — Anesthesia Procedure Notes (Signed)
Anesthesia Regional Block: Adductor canal block   Pre-Anesthetic Checklist: ,, timeout performed, Correct Patient, Correct Site, Correct Laterality, Correct Procedure, Correct Position, site marked, Risks and benefits discussed,  Surgical consent,  Pre-op evaluation,  At surgeon's request and post-op pain management  Laterality: Right and Lower  Prep: chloraprep       Needles:  Injection technique: Single-shot  Needle Type: Echogenic Stimulator Needle     Needle Length: 10cm  Needle Gauge: 21   Needle insertion depth: 3 cm   Additional Needles:   Procedures:,,,, ultrasound used (permanent image in chart),,,,  Narrative:  Start time: 04/08/2018 9:24 AM End time: 04/08/2018 9:34 AM Injection made incrementally with aspirations every 5 mL.  Performed by: Personally  Anesthesiologist: Lyn Hollingshead, MD

## 2018-04-08 NOTE — Evaluation (Signed)
Physical Therapy Evaluation Patient Details Name: Kristen Hill MRN: [id number] DOB: 1926-09-25 Today's Date: 04/08/2018   History of Present Illness  s/p R TKA  Clinical Impression  Pt is s/p TKA resulting in the deficits listed below (see PT Problem List).  Pt should progress well; will continue to follow in  acute setting; Pt will benefit from skilled PT to increase their independence and safety with mobility to allow discharge to the venue listed below.      Follow Up Recommendations Follow surgeon's recommendation for DC plan and follow-up therapies    Equipment Recommendations  None recommended by PT    Recommendations for Other Services       Precautions / Restrictions Precautions Precautions: Knee;Fall Restrictions Weight Bearing Restrictions: No Other Position/Activity Restrictions: WBAT  KI with amb     Mobility  Bed Mobility Overal bed mobility: Needs Assistance Bed Mobility: Supine to Sit     Supine to sit: Min assist     General bed mobility comments: assist with RLE   Transfers Overall transfer level: Needs assistance Equipment used: Rolling walker (2 wheeled) Transfers: Sit to/from Stand Sit to Stand: Min assist         General transfer comment: assist to rise and stabilize, cues for hand placement  Ambulation/Gait Ambulation/Gait assistance: Min assist;Mod assist Gait Distance (Feet): 25 Feet Assistive device: Rolling walker (2 wheeled) Gait Pattern/deviations: Step-to pattern;Decreased weight shift to right     General Gait Details: cues for sequence, assist for balance and wt shift; pt denies decr sensation LEs but appears to not  have full motor control yet/glut weakness requiring assist for stability  Stairs            Wheelchair Mobility    Modified Rankin (Stroke Patients Only)       Balance                                             Pertinent Vitals/Pain Pain Assessment: No/denies pain     Home Living Family/patient expects to be discharged to:: Private residence Living Arrangements: Children Available Help at Discharge: Family Type of Home: House Home Access: Stairs to enter   Technical brewer of Steps: 2 to enter and 1 step to bedroom with handles  Home Layout: One level Home Equipment: Environmental consultant - 2 wheels      Prior Function Level of Independence: Independent               Hand Dominance        Extremity/Trunk Assessment   Upper Extremity Assessment Upper Extremity Assessment: Defer to OT evaluation    Lower Extremity Assessment Lower Extremity Assessment: RLE deficits/detail RLE Deficits / Details: ankle WFL; knee AAROM grossly 10* to 55*; strength 2+/5, limited by post op pain        Communication   Communication: No difficulties  Cognition Arousal/Alertness: Awake/alert Behavior During Therapy: WFL for tasks assessed/performed Overall Cognitive Status: Within Functional Limits for tasks assessed                                        General Comments      Exercises     Assessment/Plan    PT Assessment Patient needs continued PT services  PT Problem List Decreased strength;Decreased mobility;Decreased range  of motion;Decreased knowledge of use of DME;Pain;Decreased activity tolerance       PT Treatment Interventions DME instruction;Gait training;Functional mobility training;Therapeutic activities;Therapeutic exercise;Patient/family education;Stair training    PT Goals (Current goals can be found in the Care Plan section)  Acute Rehab PT Goals Patient Stated Goal: home soon  PT Goal Formulation: With patient Time For Goal Achievement: 04/15/18 Potential to Achieve Goals: Good    Frequency 7X/week   Barriers to discharge        Co-evaluation               AM-PAC PT "6 Clicks" Daily Activity  Outcome Measure Difficulty turning over in bed (including adjusting bedclothes, sheets and blankets)?:  Unable Difficulty moving from lying on back to sitting on the side of the bed? : Unable Difficulty sitting down on and standing up from a chair with arms (e.g., wheelchair, bedside commode, etc,.)?: Unable Help needed moving to and from a bed to chair (including a wheelchair)?: A Lot Help needed walking in hospital room?: A Lot Help needed climbing 3-5 steps with a railing? : A Lot 6 Click Score: 9    End of Session Equipment Utilized During Treatment: Gait belt Activity Tolerance: Patient tolerated treatment well Patient left: in chair;with call bell/phone within reach;with chair alarm set;with family/visitor present   PT Visit Diagnosis: Difficulty in walking, not elsewhere classified (R26.2)    Time: 1530-1603 PT Time Calculation (min) (ACUTE ONLY): 33 min   Charges:   PT Evaluation $PT Eval Low Complexity: 1 Low PT Treatments $Gait Training: 8-22 mins   PT G CodesKenyon Ana, PT Pager: [phone] 04/08/2018   [facility] 04/08/2018, 4:58 PM

## 2018-04-08 NOTE — Progress Notes (Signed)
Assisted Dr. Hatchett with right, ultrasound guided, adductor canal block. Side rails up, monitors on throughout procedure. See vital signs in flow sheet. Tolerated Procedure well.  

## 2018-04-08 NOTE — Anesthesia Procedure Notes (Signed)
Spinal  Patient location during procedure: OR Start time: 04/08/2018 10:23 AM End time: 04/08/2018 10:26 AM Staffing Anesthesiologist: Lyn Hollingshead, MD Performed: anesthesiologist  Preanesthetic Checklist Completed: patient identified, site marked, surgical consent, pre-op evaluation, timeout performed, IV checked, risks and benefits discussed and monitors and equipment checked Spinal Block Patient position: sitting Prep: site prepped and draped and DuraPrep Patient monitoring: continuous pulse ox and blood pressure Approach: midline Location: L3-4 Injection technique: single-shot Needle Needle type: Pencan  Needle gauge: 24 G Needle length: 10 cm Needle insertion depth: 5 cm Assessment Sensory level: T6

## 2018-04-08 NOTE — Anesthesia Postprocedure Evaluation (Signed)
Anesthesia Post Note  Patient: Kristen Hill  Procedure(s) Performed: RIGHT UNICOMPARTMENTAL KNEE (Right Knee)     Patient location during evaluation: PACU Anesthesia Type: Spinal Level of consciousness: awake Pain management: pain level controlled Vital Signs Assessment: post-procedure vital signs reviewed and stable Respiratory status: spontaneous breathing Cardiovascular status: stable Postop Assessment: no headache, no backache, spinal receding, patient able to bend at knees and no apparent nausea or vomiting Anesthetic complications: no    Last Vitals:  Vitals:   04/08/18 1330 04/08/18 1345  BP: (!) 118/52 (!) 124/59  Pulse: 66 66  Resp: 12 12  Temp: (!) 36.4 C (!) 36.4 C  SpO2: 100% 100%    Last Pain:  Vitals:   04/08/18 1345  TempSrc:   PainSc: 0-No pain   Pain Goal:                 [name],[name]

## 2018-04-08 NOTE — Discharge Instructions (Signed)

## 2018-04-08 NOTE — Brief Op Note (Signed)
04/08/2018  1:07 PM  PATIENT:  Kristen Hill  84 y.o. female  PRE-OPERATIVE DIAGNOSIS:  RIGHT KNEE OSTEOARTHRITIS  POST-OPERATIVE DIAGNOSIS:  RIGHT KNEE OSTEOARTHRITIS  PROCEDURE:  Procedure(s): RIGHT UNICOMPARTMENTAL KNEE (Right)  SURGEON:  Surgeon(s) and Role:    Dorna Leitz, MD - Primary  PHYSICIAN ASSISTANT: jim bethune  ASSISTANTS: jim bethune   ANESTHESIA:   spinal  EBL:  10 mL   BLOOD ADMINISTERED:none  DRAINS: none   LOCAL MEDICATIONS USED:  MARCAINE    and OTHER experel marcaine and experel  SPECIMEN:  No Specimen  DISPOSITION OF SPECIMEN:  N/A  COUNTS:  YES  TOURNIQUET:   Total Tourniquet Time Documented: Thigh (Right) - 69 minutes Total: Thigh (Right) - 69 minutes   DICTATION: .Other Dictation: Dictation Number [id number]  PLAN OF CARE: Admit for overnight observation  PATIENT DISPOSITION:  PACU - hemodynamically stable.   Delay start of Pharmacological VTE agent (>24hrs) due to surgical blood loss or risk of bleeding: no

## 2018-04-08 NOTE — Transfer of Care (Signed)
Immediate Anesthesia Transfer of Care Note  Patient: Kristen Hill  Procedure(s) Performed: RIGHT UNICOMPARTMENTAL KNEE (Right Knee)  Patient Location: PACU  Anesthesia Type:Spinal  Level of Consciousness: drowsy  Airway & Oxygen Therapy: Patient Spontanous Breathing and Patient connected to face mask  Post-op Assessment: Report given to RN and Post -op Vital signs reviewed and stable  Post vital signs: Reviewed and stable  Last Vitals:  Vitals Value Taken Time  BP 96/47 04/08/2018 12:41 PM  Temp 36.4 C 04/08/2018 12:42 PM  Pulse 71 04/08/2018 12:43 PM  Resp 14 04/08/2018 12:43 PM  SpO2 95 % 04/08/2018 12:43 PM  Vitals shown include unvalidated device data.  Last Pain:  Vitals:   04/08/18 0932  TempSrc:   PainSc: 0-No pain         Complications: No apparent anesthesia complications

## 2018-04-08 NOTE — Anesthesia Preprocedure Evaluation (Signed)
Anesthesia Evaluation  Patient identified by MRN, date of birth, ID band Patient awake    Reviewed: Allergy & Precautions, NPO status , Patient's Chart, lab work & pertinent test results  Airway Mallampati: I       Dental  (+) Partial Lower, Partial Upper   Pulmonary former smoker,    Pulmonary exam normal breath sounds clear to auscultation       Cardiovascular hypertension, Pt. on medications Normal cardiovascular exam Rhythm:Regular Rate:Normal     Neuro/Psych negative psych ROS   GI/Hepatic negative GI ROS, Neg liver ROS,   Endo/Other  diabetes, Type 2, Oral Hypoglycemic Agents  Renal/GU negative Renal ROS  negative genitourinary   Musculoskeletal   Abdominal Normal abdominal exam  (+)   Peds  Hematology negative hematology ROS (+)   Anesthesia Other Findings   Reproductive/Obstetrics                             Anesthesia Physical Anesthesia Plan  ASA: II  Anesthesia Plan: Spinal   Post-op Pain Management:  Regional for Post-op pain   Induction:   PONV Risk Score and Plan: 2 and Treatment may vary due to age or medical condition and Ondansetron  Airway Management Planned: Natural Airway, Nasal Cannula and Simple Face Mask  Additional Equipment:   Intra-op Plan:   Post-operative Plan:   Informed Consent: I have reviewed the patients History and Physical, chart, labs and discussed the procedure including the risks, benefits and alternatives for the proposed anesthesia with the patient or authorized representative who has indicated his/her understanding and acceptance.     Plan Discussed with: CRNA and Surgeon  Anesthesia Plan Comments:         Anesthesia Quick Evaluation

## 2018-04-09 DIAGNOSIS — M1711 Unilateral primary osteoarthritis, right knee: Secondary | ICD-10-CM | POA: Diagnosis not present

## 2018-04-09 DIAGNOSIS — Z79899 Other long term (current) drug therapy: Secondary | ICD-10-CM | POA: Diagnosis not present

## 2018-04-09 DIAGNOSIS — Z7984 Long term (current) use of oral hypoglycemic drugs: Secondary | ICD-10-CM | POA: Diagnosis not present

## 2018-04-09 DIAGNOSIS — E785 Hyperlipidemia, unspecified: Secondary | ICD-10-CM | POA: Diagnosis not present

## 2018-04-09 DIAGNOSIS — I10 Essential (primary) hypertension: Secondary | ICD-10-CM | POA: Diagnosis not present

## 2018-04-09 DIAGNOSIS — Z87891 Personal history of nicotine dependence: Secondary | ICD-10-CM | POA: Diagnosis not present

## 2018-04-09 DIAGNOSIS — E119 Type 2 diabetes mellitus without complications: Secondary | ICD-10-CM | POA: Diagnosis not present

## 2018-04-09 LAB — BASIC METABOLIC PANEL
Anion gap: 10 (ref 5–15)
BUN: 27 mg/dL — AB (ref 8–23)
CHLORIDE: 102 mmol/L (ref 98–111)
CO2: 24 mmol/L (ref 22–32)
CREATININE: 1.18 mg/dL — AB (ref 0.44–1.00)
Calcium: 8.8 mg/dL — ABNORMAL LOW (ref 8.9–10.3)
GFR calc Af Amer: 46 mL/min — ABNORMAL LOW (ref >60)
GFR calc non Af Amer: 39 mL/min — ABNORMAL LOW (ref >60)
Glucose, Bld: 192 mg/dL — ABNORMAL HIGH (ref 70–99)
Potassium: 4.2 mmol/L (ref 3.5–5.1)
SODIUM: 136 mmol/L (ref 135–145)

## 2018-04-09 LAB — CBC
HCT: 34.1 % — ABNORMAL LOW (ref 36.0–46.0)
Hemoglobin: 11.6 g/dL — ABNORMAL LOW (ref 12.0–15.0)
MCH: 31.2 pg (ref 26.0–34.0)
MCHC: 34 g/dL (ref 30.0–36.0)
MCV: 91.7 fL (ref 78.0–100.0)
PLATELETS: 225 10*3/uL (ref 150–400)
RBC: 3.72 MIL/uL — ABNORMAL LOW (ref 3.87–5.11)
RDW: 13.3 % (ref 11.5–15.5)
WBC: 15 10*3/uL — ABNORMAL HIGH (ref 4.0–10.5)

## 2018-04-09 LAB — GLUCOSE, CAPILLARY
GLUCOSE-CAPILLARY: 184 mg/dL — AB (ref 70–99)
GLUCOSE-CAPILLARY: 177 mg/dL — AB (ref 70–99)
Glucose-Capillary: 193 mg/dL — ABNORMAL HIGH (ref 70–99)

## 2018-04-09 NOTE — Care Management CC44 (Signed)
Condition Code 44 Documentation Completed  Patient Details  Name: Kristen Hill MRN: [id number] Date of Birth: 1926-01-29   Condition Code 44 given:   Yes Patient signature on Condition Code 44 notice:   Pt did not sign the form Documentation of 2 MD's agreement:    Code 44 added to claim:       Norina Buzzard, RN 04/09/2018, 11:44 AM

## 2018-04-09 NOTE — Progress Notes (Signed)
   PATIENT ID: Hortencia Conradi   1 Day Post-Op Procedure(s) (LRB): RIGHT UNICOMPARTMENTAL KNEE (Right)  Subjective: Doing great this am. No pain. Hoping to d/c home today. Up with PT and tolerated that well  Objective:  Vitals:   04/09/18 0536 04/09/18 0821  BP: (!) 124/56 135/61  Pulse: 72 73  Resp: 16 14  Temp: 98.3 F (36.8 C) 97.7 F (36.5 C)  SpO2: 95% 97%     R LE dressing c/d/ Wiggles toes, distally "NVI  Labs:  No results for input(s): HGB in the last 72 hours.No results for input(s): WBC, RBC, HCT, PLT in the last 72 hours.No results for input(s): NA, K, CL, CO2, BUN, CREATININE, GLUCOSE, CALCIUM in the last 72 hours.  Assessment and Plan: 1 day s/p R TKA Labs pending D/c home when cleared by PT and labs get back Fu with Dr. Berenice Primas  VTE proph: asa, scds

## 2018-04-09 NOTE — Progress Notes (Signed)
Occupational Therapy Evaluation Patient Details Name: Kristen Hill MRN: [id number] DOB: 1926/08/18 Today's Date: 04/09/2018    History of Present Illness s/p R TKA   Clinical Impression   All OT education completed and pt questions answered. No further OT needs at this time. Will sign off.    Follow Up Recommendations  No OT follow up    Equipment Recommendations  3 in 1 bedside commode    Recommendations for Other Services       Precautions / Restrictions Precautions Precautions: Knee;Fall Precaution Comments: not utilized, IND SLRs Required Braces or Orthoses: Knee Immobilizer - Right Knee Immobilizer - Right: On when out of bed or walking Restrictions Weight Bearing Restrictions: No Other Position/Activity Restrictions: WBAT      Mobility Bed Mobility                  Transfers                      Balance                                           ADL either performed or assessed with clinical judgement   ADL Overall ADL's : Needs assistance/impaired                                       General ADL Comments: Educated patient about use of 3 in 1 for toileting and showering. Reviewed toilet transfer technique, safe toileting, walk-in shower transfer technique (handout provided). Answered all pt. questions.     Vision         Perception     Praxis      Pertinent Vitals/Pain Pain Assessment: 0-10 Pain Score: 2  Pain Location: right knee Pain Descriptors / Indicators: Sore Pain Intervention(s): Monitored during session     Hand Dominance     Extremity/Trunk Assessment Upper Extremity Assessment Upper Extremity Assessment: Overall WFL for tasks assessed   Lower Extremity Assessment Lower Extremity Assessment: Defer to PT evaluation       Communication Communication Communication: No difficulties   Cognition Arousal/Alertness: Awake/alert Behavior During Therapy: WFL for tasks  assessed/performed Overall Cognitive Status: Within Functional Limits for tasks assessed                                     General Comments       Exercises     Shoulder Instructions      Home Living Family/patient expects to be discharged to:: Private residence Living Arrangements: Children Available Help at Discharge: Family Type of Home: House Home Access: Stairs to enter [employer] of Steps: 2 to enter and 1 step to bedroom with handles    Home Layout: One level     Bathroom Shower/Tub: Occupational psychologist: Standard Bathroom Accessibility: Yes How Accessible: Accessible via walker Home Equipment: [place] - 2 wheels          Prior Functioning/Environment Level of Independence: Independent                 OT Problem List: Decreased strength;Decreased activity tolerance;Decreased knowledge of use of DME or AE;Pain      OT Treatment/Interventions:  OT Goals(Current goals can be found in the care plan section) Acute Rehab OT Goals Patient Stated Goal: home soon  OT Goal Formulation: All assessment and education complete, DC therapy  OT Frequency:     Barriers to D/C:            Co-evaluation              AM-PAC PT "6 Clicks" Daily Activity     Outcome Measure Help from another person eating meals?: None Help from another person taking care of personal grooming?: A Little Help from another person toileting, which includes using toliet, bedpan, or urinal?: A Little Help from another person bathing (including washing, rinsing, drying)?: A Little Help from another person to put on and taking off regular upper body clothing?: None Help from another person to put on and taking off regular lower body clothing?: A Little 6 Click Score: 20   End of Session Nurse Communication: Mobility status  Activity Tolerance: Patient tolerated treatment well Patient left: in chair;with call bell/phone within reach;with  family/visitor present  OT Visit Diagnosis: Other abnormalities of gait and mobility (R26.89)                Time: 1217-1229 OT Time Calculation (min): 12 min Charges:  OT General Charges $OT Visit: 1 Visit OT Evaluation $OT Eval Low Complexity: 1 Low G-Codes:       Lache Dagher A Orphia Mctigue May 01, 2018, 2:05 PM

## 2018-04-09 NOTE — Progress Notes (Signed)
Physical Therapy Treatment Patient Details Name: Kristen Hill MRN: [id number] DOB: [birth year] Today's Date: 04/09/2018    History of Present Illness s/p R TKA    PT Comments    Progressing well; will see for second session for stair training, should be ready to d/c later today  Follow Up Recommendations  Follow surgeon's recommendation for DC plan and follow-up therapies     Equipment Recommendations  3in1 (PT);Rolling walker with 5" wheels(petite)    Recommendations for Other Services       Precautions / Restrictions Precautions Precautions: Knee;Fall Precaution Comments: not utilized, IND SLRs Required Braces or Orthoses: Knee Immobilizer - Right Knee Immobilizer - Right: On when out of bed or walking Restrictions Weight Bearing Restrictions: No Other Position/Activity Restrictions: WBAT    Mobility  Bed Mobility               General bed mobility comments: in chair  Transfers Overall transfer level: Needs assistance Equipment used: Rolling walker (2 wheeled) Transfers: Sit to/from Stand Sit to Stand: Supervision         General transfer comment: cues for hand placement  Ambulation/Gait Ambulation/Gait assistance: Min guard;Supervision Gait Distance (Feet): 120 Feet Assistive device: Rolling walker (2 wheeled) Gait Pattern/deviations: Step-to pattern;Decreased weight shift to right     General Gait Details: cues for sequence adn gait progression   Stairs             Wheelchair Mobility    Modified Rankin (Stroke Patients Only)       Balance                                            Cognition Arousal/Alertness: Awake/alert Behavior During Therapy: WFL for tasks assessed/performed Overall Cognitive Status: Within Functional Limits for tasks assessed                                        Exercises Total Joint Exercises Ankle Circles/Pumps: AROM;Both;10 reps Quad Sets: AROM;Both;15  reps Heel Slides: AAROM;Right;10 reps Hip ABduction/ADduction: AROM;Strengthening;Right;10 reps Straight Leg Raises: AROM;Strengthening;Right;10 reps Goniometric ROM: 5* to 70* AAROM right knee flexion    General Comments        Pertinent Vitals/Pain Pain Assessment: 0-10 Pain Score: 2  Pain Location: right knee Pain Descriptors / Indicators: Sore Pain Intervention(s): Monitored during session;Ice applied    Home Living                      Prior Function            PT Goals (current goals can now be found in the care plan section) Acute Rehab PT Goals Patient Stated Goal: home soon  PT Goal Formulation: With patient Time For Goal Achievement: 04/15/18 Potential to Achieve Goals: Good    Frequency    7X/week      PT Plan Current plan remains appropriate    Co-evaluation              AM-PAC PT "6 Clicks" Daily Activity  Outcome Measure  Difficulty turning over in bed (including adjusting bedclothes, sheets and blankets)?: A Little Difficulty moving from lying on back to sitting on the side of the bed? : A Little Difficulty sitting down on and standing up from a chair with  arms (e.g., wheelchair, bedside commode, etc,.)?: A Little Help needed moving to and from a bed to chair (including a wheelchair)?: A Little Help needed walking in hospital room?: A Little Help needed climbing 3-5 steps with a railing? : A Little 6 Click Score: 18    End of Session Equipment Utilized During Treatment: Gait belt Activity Tolerance: Patient tolerated treatment well Patient left: in chair;with call bell/phone within reach;with chair alarm set;with family/visitor present   PT Visit Diagnosis: Difficulty in walking, not elsewhere classified (R26.2)     Time: 7741-2878 PT Time Calculation (min) (ACUTE ONLY): 27 min  Charges:  $Gait Training: 8-22 mins $Therapeutic Exercise: 23-37 mins                    G Codes:          Kristen Hill April 15, 2018,  10:48 AM

## 2018-04-09 NOTE — Progress Notes (Signed)
Discharge instructions provided to patient and daughter.  Both verbalized understanding.  All questions/concerns addressed.  Patient discharging to home with all equipment and belongings.

## 2018-04-09 NOTE — Care Management Note (Signed)
Case Management Note  Patient Details  Name: Genelda Roark MRN: [id number] Date of Birth: 06-Feb-1926  Subjective/Objective: 84 yo F s/p R TKA                   Action/Plan: Referral received to assist pt with [facility] and DME   Expected Discharge Date:  04/09/18               Expected Discharge Plan:  [place]  In-House Referral:     Discharge planning Services  CM Consult  Post Acute Care Choice:    Choice offered to:  Patient, Adult Children  DME Arranged:  3-N-1, [facility] youth DME Agency:  [place]:  PT [place] Agency:  [facility]  Status of Service:  Completed, signed off  If discussed at [employer], dates discussed:    Additional Comments: Met with pt and daughter. She plans to return home with the support of her daughter. She needs a youth RW and a 3-in-1 BSC. Discussed with pt preference for a [facility] agency. She stated that she wants to use [facility]. Contacted Jermaine and Reggie at [facility] and DME referral.  Norina Buzzard, RN 04/09/2018, 11:58 AM

## 2018-04-09 NOTE — Op Note (Signed)
NAME: Kristen Hill, Kristen Hill MEDICAL RECORD FA:[id number] ACCOUNT [id number] DATE OF BIRTH:05-Oct-1925 FACILITY: [facility] LOCATION: [facility] PHYSICIAN:Bryton Waight L. Sirr Kabel, MD  OPERATIVE REPORT  DATE OF PROCEDURE:  04/08/2018  PREOPERATIVE DIAGNOSES:  End-stage degenerative joint disease, right knee with bone-on-bone change isolated to the medial compartment.  POSTOPERATIVE DIAGNOSES:  Degenerative joint disease, right knee with bone-on-bone change isolated to the medial compartment.    PROCEDURE:  Right unicompartmental knee replacement with a Zimmer Persona system size 2 femur, size D tibia, and an 8 mm bridging bearing.  SURGEON:  Dorna Leitz, MD.  ASSISTANT: Dr. Modena Slater.  ANESTHESIA:  Spinal.  BRIEF HISTORY:  The patient is a 84 year old female with a long history of significant complaints of right knee pain.  She has x-rays showing bone-on-bone changes, isolated to the medial compartment.  We talked about treatment options including the  possibility of full knee replacement versus unicompartmental knee replacement.  Given her age and activity level, I felt that unicompartmental knee replacement may be a little easier for her to get over and she was brought to the operating room for this  procedure.  DESCRIPTION OF PROCEDURE:  The patient was brought to the operating room after adequate anesthesia was obtained with general anesthetic, the patient was taken to the operating table.  The right leg was then prepped and draped in usual sterile fashion.   Following this, the leg was exsanguinated, blood pressure tourniquet inflated to 275 mmHg.  Following this, a midline incision was made, subcutaneous tissues down to the level of the extensor mechanism and a medial parapatellar arthrotomy was undertaken.   Following this, attention was turned towards exposure of the tibia, osteophytes were removed and some fat pad is removed from behind the patella, as well as from the medial side of the wound.  Once  retractors were put in place, the tibial guide is put  in place and a resection is made of the tibia, with an anterior to posterior cut being made initially and then the lateral cut being made next.  Once this was completed, attention was turned towards removing this piece, it was taken to the back bone, has  the classic disease pattern of isolated arthritic change.  Following this, attention was turned towards the femur.  A 9 spacer block was put in place and excellent extension was able to be achieved at this point.  Flexion gap is fine as well.  Attention  was then turned towards the distal resection with a 9 guide being put in place, the knee held, and snugged up to the tibia and a drill pin is put in place and the posterior resection is made off of the femur.  At this point, attention was turned towards  sizing.  She is sized to a 2.  The 2 is put in place and drill pins are drilled throughout the leg.  The posterior cut is made again, the chamfer cut is made and the 2 drill holes are made into the femur.  Attention was then turned towards the tibia.   It sized to a D.  It is drilled and keeled and then the wounds are irrigated thoroughly.  A trial 9 is put in place.  Excellent range of motion and stability are achieved.  I still think it is a little touch tight.  I can get a 2 mm shim in, but it is a  little tightish in this area.  Once this was done, attention was turned towards the femur and tibia and  the final components were cemented into place.  A size 2 femur, a size D tibial tray is put in place, and a 10 mm poly is placed and held until the  cement is allowed to completely harden to pressurize the cement.  Once this was done, all excess bone cement was removed.  Cement is allowed to completely harden and the tourniquet was let down.  I took out the 10 poly, put a 9 in, and then checked it  through range of motion.  Excellent range of motion was achieved, still was a little snug with the 2 mm  shim in extension, and a little snug with a 3 in flexion, so I went with an 8 poly.  It was opened and placed and at this point, the wound was  irrigated, suctioned dry.  Exparel was instilled throughout the knee for postoperative anesthesia and pain control and the medial parapatellar arthrotomy was closed with #1 Vicryl.  Skin with 0 and 2-0 Vicryl and 3-0 Monocryl subcuticular.  Benzoin and  Steri-Strips were applied.  Sterile compressive dressing was applied and the patient was taken to recovery room.  She was noted to be in satisfactory condition.  Estimated blood loss for the procedure is minimal.  TN/NUANCE  D:04/08/2018 T:04/08/2018 JOB:[id number]

## 2018-04-09 NOTE — Progress Notes (Signed)
   04/09/18 1600  PT Visit Information  Pt progressing well; reviewed TKA HEP as well as modifications for current HEP (pt doing prior to surgery fro her back), practiced stairs and reviewed handout, dtr present; pt asking about driving--advised to ask her physician, no driving allowed now;    Assistance Needed +1  History of Present Illness s/p R TKA  Subjective Data  Patient Stated Goal home soon   Precautions  Precautions Knee;Fall  Precaution Comments not utilized, IND SLRs  Required Braces or Orthoses Knee Immobilizer - Right  Knee Immobilizer - Right On when out of bed or walking  Restrictions  Weight Bearing Restrictions No  Other Position/Activity Restrictions WBAT  Pain Assessment  Pain Assessment 0-10  Pain Score 2  Pain Location right knee  Pain Descriptors / Indicators Sore  Pain Intervention(s) Monitored during session;Ice applied  Cognition  Arousal/Alertness Awake/alert  Behavior During Therapy WFL for tasks assessed/performed  Overall Cognitive Status Within Functional Limits for tasks assessed  Bed Mobility  General bed mobility comments in chair  Transfers  Overall transfer level Needs assistance  Equipment used Rolling walker (2 wheeled)  Transfers Sit to/from Stand  Sit to [place] transfer comment cues for hand placement  Ambulation/Gait  Ambulation/Gait assistance Min guard;Supervision  Gait Distance (Feet) 160 Feet (10' more)  Assistive device Rolling walker (2 wheeled)  Gait Pattern/deviations Step-to pattern;Decreased weight shift to right  General Gait Details cues for sequence adn gait progression  Stairs Yes  Stairs assistance Min assist  Stair Management No rails;Step to pattern;With walker;Backwards  Number of Stairs 2  General stair comments cues for sequence and safe technique  PT - End of Session  Equipment Utilized During Treatment Gait belt  Activity Tolerance Patient tolerated treatment well  Patient left in  chair;with call bell/phone within reach;with chair alarm set;with family/visitor present   PT - Assessment/Plan  PT Plan Current plan remains appropriate  PT Visit Diagnosis Difficulty in walking, not elsewhere classified (R26.2)  PT Frequency (ACUTE ONLY) 7X/week  Follow Up Recommendations Follow surgeon's recommendation for DC plan and follow-up therapies  PT equipment 3in1 (PT);Rolling walker with 5" wheels (petite)  AM-PAC PT "6 Clicks" Daily Activity Outcome Measure  Difficulty turning over in bed (including adjusting bedclothes, sheets and blankets)? 3  Difficulty moving from lying on back to sitting on the side of the bed?  3  Difficulty sitting down on and standing up from a chair with arms (e.g., wheelchair, bedside commode, etc,.)? 3  Help needed moving to and from a bed to chair (including a wheelchair)? 3  Help needed walking in hospital room? 3  Help needed climbing 3-5 steps with a railing?  3  6 Click Score 18  Mobility G Code  CK  Acute Rehab PT Goals  PT Goal Formulation With patient  Time For Goal Achievement 04/15/18  Potential to Achieve Goals Good  PT Time Calculation  PT Start Time (ACUTE ONLY) 1423  PT Stop Time (ACUTE ONLY) 1501  PT Time Calculation (min) (ACUTE ONLY) 38 min  PT General Charges  $$ ACUTE PT VISIT 1 Visit  PT Treatments  $Gait Training 23-37 mins  $Therapeutic Activity 8-22 mins

## 2018-04-11 ENCOUNTER — Encounter (HOSPITAL_COMMUNITY): Payer: Self-pay | Admitting: Orthopedic Surgery

## 2018-04-11 DIAGNOSIS — Z7982 Long term (current) use of aspirin: Secondary | ICD-10-CM | POA: Diagnosis not present

## 2018-04-11 DIAGNOSIS — Z7984 Long term (current) use of oral hypoglycemic drugs: Secondary | ICD-10-CM | POA: Diagnosis not present

## 2018-04-11 DIAGNOSIS — E785 Hyperlipidemia, unspecified: Secondary | ICD-10-CM | POA: Diagnosis not present

## 2018-04-11 DIAGNOSIS — I10 Essential (primary) hypertension: Secondary | ICD-10-CM | POA: Diagnosis not present

## 2018-04-11 DIAGNOSIS — Z96652 Presence of left artificial knee joint: Secondary | ICD-10-CM | POA: Diagnosis not present

## 2018-04-11 DIAGNOSIS — M1990 Unspecified osteoarthritis, unspecified site: Secondary | ICD-10-CM | POA: Diagnosis not present

## 2018-04-11 DIAGNOSIS — C50411 Malignant neoplasm of upper-outer quadrant of right female breast: Secondary | ICD-10-CM | POA: Diagnosis not present

## 2018-04-11 DIAGNOSIS — E119 Type 2 diabetes mellitus without complications: Secondary | ICD-10-CM | POA: Diagnosis not present

## 2018-04-11 DIAGNOSIS — Z471 Aftercare following joint replacement surgery: Secondary | ICD-10-CM | POA: Diagnosis not present

## 2018-04-12 NOTE — Discharge Summary (Signed)
Patient ID: Kristen Hill MRN: [id number] DOB/AGE: 11/07/1925 84 y.o.  Admit date: 04/08/2018 Discharge date: 04/09/2018  Admission Diagnoses:  Principal Problem:   Primary osteoarthritis of right knee   Discharge Diagnoses:  Same  Past Medical History:  Diagnosis Date  . Aortic atherosclerosis ([place])   . Cancer [facility])    right breast  . Diabetes mellitus without complication ([place]) 6734   type 2  . Glaucoma    left eye  . Hematuria 10/31/2013  . History of external beam radiation therapy 07/21/17- 08/11/17   Right Breast 2.66 Gy X 16 fractions  . Hypertension   . Jaundice    in 1947  . Jaundice 1946  . Measles as a child  . Multinodular goiter   . Mumps as a child  . OA (osteoarthritis)    right knee, torn meniscus lateral and medial  . Other and unspecified hyperlipidemia 07/31/2013  . Physical exam, annual 08/02/2013   Sees Dr Trinna Post at [facility] Dr Farris Has, Retinal specialist Dermatolgy  . Thyroid nodule   . Wears glasses   . Wears partial dentures     Surgeries: Procedure(s): RIGHT UNICOMPARTMENTAL KNEE on 04/08/2018   Consultants:   Discharged Condition: Improved  Hospital Course: Kristen Hill is an 84 y.o. female who was admitted 04/08/2018 for operative treatment ofPrimary osteoarthritis of right knee. Patient has severe unremitting pain that affects sleep, daily activities, and work/hobbies. After pre-op clearance the patient was taken to the operating room on 04/08/2018 and underwent  Procedure(s): RIGHT UNICOMPARTMENTAL KNEE.    Patient was given perioperative antibiotics:  Anti-infectives (From admission, onward)   Start     Dose/Rate Route Frequency Ordered Stop   04/08/18 1700  ceFAZolin (ANCEF) IVPB 2g/100 mL premix     2 g 200 mL/hr over 30 Minutes Intravenous Every 6 hours 04/08/18 1416 04/08/18 2246   04/08/18 0800  ceFAZolin (ANCEF) IVPB 2g/100 mL premix     2 g 200 mL/hr over 30 Minutes Intravenous On call to O.R.  04/08/18 0746 04/08/18 1040       Patient was given sequential compression devices, early ambulation, and chemoprophylaxis to prevent DVT.  Patient benefited maximally from hospital stay and there were no complications.    Recent vital signs: No data found.   Recent laboratory studies:  Recent Labs    04/09/18 1227  WBC 15.0*  HGB 11.6*  HCT 34.1*  PLT 225  NA 136  K 4.2  CL 102  CO2 24  BUN 27*  CREATININE 1.18*  GLUCOSE 192*  CALCIUM 8.8*     Discharge Medications:   Allergies as of 04/09/2018      Reactions   Bee Venom Anaphylaxis   Sulfa Antibiotics Rash      Medication List    TAKE these medications   amLODipine 5 MG tablet Commonly known as:  NORVASC Take 1 tablet (5 mg total) by mouth daily.   anastrozole 1 MG tablet Commonly known as:  ARIMIDEX Take 1 tablet (1 mg total) daily by mouth.   aspirin EC 325 MG tablet Take 1 tablet (325 mg total) by mouth 2 (two) times daily after a meal. Take x 1 month post op to decrease risk of blood clots.   bimatoprost 0.01 % Soln Commonly known as:  LUMIGAN Place 1 drop into the left eye at bedtime.   docusate sodium 100 MG capsule Commonly known as:  COLACE Take 1 capsule (100 mg total) by mouth 2 (two) times daily.  EPINEPHrine 0.3 mg/0.3 mL Soaj injection Commonly known as:  EPI-PEN Inject 0.3 mg into the skin once.   ferrous sulfate 325 (65 FE) MG tablet Take 325 mg by mouth every other day.   glucose blood test strip Commonly known as:  BAYER CONTOUR TEST Check blood sugars q am and prn   HYDROcodone-acetaminophen 5-325 MG tablet Commonly known as:  NORCO Take 1-2 tablets by mouth every 6 (six) hours as needed for moderate pain.   metFORMIN 500 MG tablet Commonly known as:  GLUCOPHAGE Take 1 tablet (500 mg total) by mouth 2 (two) times daily with a meal.   MICROLET LANCETS Misc Check BS q am and prn   multivitamin with minerals Tabs tablet Take 1 tablet by mouth daily.   tiZANidine 2 MG  tablet Commonly known as:  ZANAFLEX Take 1 tablet (2 mg total) by mouth every 8 (eight) hours as needed for muscle spasms.   VITAMIN D (ERGOCALCIFEROL) PO Take 1,000 Units by mouth daily.       Diagnostic Studies: Dg Chest 2 View  Result Date: 04/01/2018 CLINICAL DATA:  Preoperative partial knee replacement. History of breast carcinoma EXAM: CHEST - 2 VIEW COMPARISON:  None. FINDINGS: There is minimal scarring in the bases. The lungs elsewhere are clear. The heart size and pulmonary vascularity are normal. No adenopathy. There is aortic atherosclerosis. There is degenerative change in the lower thoracic and upper lumbar regions. IMPRESSION: Minimal bibasilar scarring. Lungs elsewhere clear. No adenopathy. Heart size normal. There is aortic atherosclerosis. Aortic Atherosclerosis (ICD10-I70.0). Electronically Signed   By: Lowella Grip III M.D.   On: 04/01/2018 13:06    Disposition:   Discharge Instructions    Call MD / Call 911   Complete by:  As directed    If you experience chest pain or shortness of breath, CALL 911 and be transported to the hospital emergency room.  If you develope a fever above 101 F, pus (white drainage) or increased drainage or redness at the wound, or calf pain, call your surgeon's office.   Constipation Prevention   Complete by:  As directed    Drink plenty of fluids.  Prune juice may be helpful.  You may use a stool softener, such as Colace (over the counter) 100 mg twice a day.  Use MiraLax (over the counter) for constipation as needed.   Diet - low sodium heart healthy   Complete by:  As directed    Increase activity slowly as tolerated   Complete by:  As directed       Follow-up Information    Dorna Leitz, MD. Schedule an appointment as soon as possible for a visit in 10 days.   Specialty:  Orthopedic Surgery Contact information: [place] [address] [place] Follow up.   Why:   [facility] will follow up with physical therapy Contact information: [address] [address] [place] .   Contact information: [address] [place] [address] [phone]            Signed: Grier Mitts 04/12/2018, 9:48 AM

## 2018-04-13 DIAGNOSIS — Z471 Aftercare following joint replacement surgery: Secondary | ICD-10-CM | POA: Diagnosis not present

## 2018-04-13 DIAGNOSIS — Z7982 Long term (current) use of aspirin: Secondary | ICD-10-CM | POA: Diagnosis not present

## 2018-04-13 DIAGNOSIS — Z7984 Long term (current) use of oral hypoglycemic drugs: Secondary | ICD-10-CM | POA: Diagnosis not present

## 2018-04-13 DIAGNOSIS — M1990 Unspecified osteoarthritis, unspecified site: Secondary | ICD-10-CM | POA: Diagnosis not present

## 2018-04-13 DIAGNOSIS — E785 Hyperlipidemia, unspecified: Secondary | ICD-10-CM | POA: Diagnosis not present

## 2018-04-13 DIAGNOSIS — E119 Type 2 diabetes mellitus without complications: Secondary | ICD-10-CM | POA: Diagnosis not present

## 2018-04-13 DIAGNOSIS — I10 Essential (primary) hypertension: Secondary | ICD-10-CM | POA: Diagnosis not present

## 2018-04-13 DIAGNOSIS — Z96652 Presence of left artificial knee joint: Secondary | ICD-10-CM | POA: Diagnosis not present

## 2018-04-13 DIAGNOSIS — C50411 Malignant neoplasm of upper-outer quadrant of right female breast: Secondary | ICD-10-CM | POA: Diagnosis not present

## 2018-04-18 DIAGNOSIS — E785 Hyperlipidemia, unspecified: Secondary | ICD-10-CM | POA: Diagnosis not present

## 2018-04-18 DIAGNOSIS — M1990 Unspecified osteoarthritis, unspecified site: Secondary | ICD-10-CM | POA: Diagnosis not present

## 2018-04-18 DIAGNOSIS — E119 Type 2 diabetes mellitus without complications: Secondary | ICD-10-CM | POA: Diagnosis not present

## 2018-04-18 DIAGNOSIS — C50411 Malignant neoplasm of upper-outer quadrant of right female breast: Secondary | ICD-10-CM | POA: Diagnosis not present

## 2018-04-18 DIAGNOSIS — Z7982 Long term (current) use of aspirin: Secondary | ICD-10-CM | POA: Diagnosis not present

## 2018-04-18 DIAGNOSIS — Z7984 Long term (current) use of oral hypoglycemic drugs: Secondary | ICD-10-CM | POA: Diagnosis not present

## 2018-04-18 DIAGNOSIS — Z471 Aftercare following joint replacement surgery: Secondary | ICD-10-CM | POA: Diagnosis not present

## 2018-04-18 DIAGNOSIS — Z96652 Presence of left artificial knee joint: Secondary | ICD-10-CM | POA: Diagnosis not present

## 2018-04-18 DIAGNOSIS — I10 Essential (primary) hypertension: Secondary | ICD-10-CM | POA: Diagnosis not present

## 2018-04-21 DIAGNOSIS — E785 Hyperlipidemia, unspecified: Secondary | ICD-10-CM | POA: Diagnosis not present

## 2018-04-21 DIAGNOSIS — M1711 Unilateral primary osteoarthritis, right knee: Secondary | ICD-10-CM | POA: Diagnosis not present

## 2018-04-21 DIAGNOSIS — Z7982 Long term (current) use of aspirin: Secondary | ICD-10-CM | POA: Diagnosis not present

## 2018-04-21 DIAGNOSIS — Z471 Aftercare following joint replacement surgery: Secondary | ICD-10-CM | POA: Diagnosis not present

## 2018-04-21 DIAGNOSIS — M1990 Unspecified osteoarthritis, unspecified site: Secondary | ICD-10-CM | POA: Diagnosis not present

## 2018-04-21 DIAGNOSIS — Z96652 Presence of left artificial knee joint: Secondary | ICD-10-CM | POA: Diagnosis not present

## 2018-04-21 DIAGNOSIS — E119 Type 2 diabetes mellitus without complications: Secondary | ICD-10-CM | POA: Diagnosis not present

## 2018-04-21 DIAGNOSIS — I10 Essential (primary) hypertension: Secondary | ICD-10-CM | POA: Diagnosis not present

## 2018-04-21 DIAGNOSIS — C50411 Malignant neoplasm of upper-outer quadrant of right female breast: Secondary | ICD-10-CM | POA: Diagnosis not present

## 2018-04-21 DIAGNOSIS — Z7984 Long term (current) use of oral hypoglycemic drugs: Secondary | ICD-10-CM | POA: Diagnosis not present

## 2018-04-25 DIAGNOSIS — Z7982 Long term (current) use of aspirin: Secondary | ICD-10-CM | POA: Diagnosis not present

## 2018-04-25 DIAGNOSIS — I10 Essential (primary) hypertension: Secondary | ICD-10-CM | POA: Diagnosis not present

## 2018-04-25 DIAGNOSIS — C50411 Malignant neoplasm of upper-outer quadrant of right female breast: Secondary | ICD-10-CM | POA: Diagnosis not present

## 2018-04-25 DIAGNOSIS — Z471 Aftercare following joint replacement surgery: Secondary | ICD-10-CM | POA: Diagnosis not present

## 2018-04-25 DIAGNOSIS — E119 Type 2 diabetes mellitus without complications: Secondary | ICD-10-CM | POA: Diagnosis not present

## 2018-04-25 DIAGNOSIS — M1990 Unspecified osteoarthritis, unspecified site: Secondary | ICD-10-CM | POA: Diagnosis not present

## 2018-04-25 DIAGNOSIS — Z96652 Presence of left artificial knee joint: Secondary | ICD-10-CM | POA: Diagnosis not present

## 2018-04-25 DIAGNOSIS — E785 Hyperlipidemia, unspecified: Secondary | ICD-10-CM | POA: Diagnosis not present

## 2018-04-25 DIAGNOSIS — Z7984 Long term (current) use of oral hypoglycemic drugs: Secondary | ICD-10-CM | POA: Diagnosis not present

## 2018-04-28 DIAGNOSIS — C50411 Malignant neoplasm of upper-outer quadrant of right female breast: Secondary | ICD-10-CM | POA: Diagnosis not present

## 2018-04-28 DIAGNOSIS — M1990 Unspecified osteoarthritis, unspecified site: Secondary | ICD-10-CM | POA: Diagnosis not present

## 2018-04-28 DIAGNOSIS — Z471 Aftercare following joint replacement surgery: Secondary | ICD-10-CM | POA: Diagnosis not present

## 2018-04-28 DIAGNOSIS — I10 Essential (primary) hypertension: Secondary | ICD-10-CM | POA: Diagnosis not present

## 2018-04-28 DIAGNOSIS — Z96652 Presence of left artificial knee joint: Secondary | ICD-10-CM | POA: Diagnosis not present

## 2018-04-28 DIAGNOSIS — E119 Type 2 diabetes mellitus without complications: Secondary | ICD-10-CM | POA: Diagnosis not present

## 2018-04-28 DIAGNOSIS — E785 Hyperlipidemia, unspecified: Secondary | ICD-10-CM | POA: Diagnosis not present

## 2018-04-28 DIAGNOSIS — Z7982 Long term (current) use of aspirin: Secondary | ICD-10-CM | POA: Diagnosis not present

## 2018-04-28 DIAGNOSIS — Z7984 Long term (current) use of oral hypoglycemic drugs: Secondary | ICD-10-CM | POA: Diagnosis not present

## 2018-05-02 ENCOUNTER — Ambulatory Visit: Payer: Medicare PPO | Admitting: Physical Therapy

## 2018-05-02 DIAGNOSIS — Z7982 Long term (current) use of aspirin: Secondary | ICD-10-CM | POA: Diagnosis not present

## 2018-05-02 DIAGNOSIS — E119 Type 2 diabetes mellitus without complications: Secondary | ICD-10-CM | POA: Diagnosis not present

## 2018-05-02 DIAGNOSIS — M1990 Unspecified osteoarthritis, unspecified site: Secondary | ICD-10-CM | POA: Diagnosis not present

## 2018-05-02 DIAGNOSIS — Z471 Aftercare following joint replacement surgery: Secondary | ICD-10-CM | POA: Diagnosis not present

## 2018-05-02 DIAGNOSIS — I10 Essential (primary) hypertension: Secondary | ICD-10-CM | POA: Diagnosis not present

## 2018-05-02 DIAGNOSIS — E785 Hyperlipidemia, unspecified: Secondary | ICD-10-CM | POA: Diagnosis not present

## 2018-05-02 DIAGNOSIS — Z7984 Long term (current) use of oral hypoglycemic drugs: Secondary | ICD-10-CM | POA: Diagnosis not present

## 2018-05-02 DIAGNOSIS — Z96652 Presence of left artificial knee joint: Secondary | ICD-10-CM | POA: Diagnosis not present

## 2018-05-02 DIAGNOSIS — C50411 Malignant neoplasm of upper-outer quadrant of right female breast: Secondary | ICD-10-CM | POA: Diagnosis not present

## 2018-05-04 DIAGNOSIS — Z96652 Presence of left artificial knee joint: Secondary | ICD-10-CM | POA: Diagnosis not present

## 2018-05-04 DIAGNOSIS — Z471 Aftercare following joint replacement surgery: Secondary | ICD-10-CM | POA: Diagnosis not present

## 2018-05-04 DIAGNOSIS — E119 Type 2 diabetes mellitus without complications: Secondary | ICD-10-CM | POA: Diagnosis not present

## 2018-05-04 DIAGNOSIS — E785 Hyperlipidemia, unspecified: Secondary | ICD-10-CM | POA: Diagnosis not present

## 2018-05-04 DIAGNOSIS — M1990 Unspecified osteoarthritis, unspecified site: Secondary | ICD-10-CM | POA: Diagnosis not present

## 2018-05-04 DIAGNOSIS — C50411 Malignant neoplasm of upper-outer quadrant of right female breast: Secondary | ICD-10-CM | POA: Diagnosis not present

## 2018-05-04 DIAGNOSIS — Z7982 Long term (current) use of aspirin: Secondary | ICD-10-CM | POA: Diagnosis not present

## 2018-05-04 DIAGNOSIS — Z7984 Long term (current) use of oral hypoglycemic drugs: Secondary | ICD-10-CM | POA: Diagnosis not present

## 2018-05-04 DIAGNOSIS — I10 Essential (primary) hypertension: Secondary | ICD-10-CM | POA: Diagnosis not present

## 2018-05-05 ENCOUNTER — Ambulatory Visit: Payer: Medicare PPO | Admitting: Physical Therapy

## 2018-05-05 DIAGNOSIS — M1711 Unilateral primary osteoarthritis, right knee: Secondary | ICD-10-CM | POA: Diagnosis not present

## 2018-05-05 DIAGNOSIS — Z471 Aftercare following joint replacement surgery: Secondary | ICD-10-CM | POA: Diagnosis not present

## 2018-05-09 ENCOUNTER — Ambulatory Visit: Payer: Medicare PPO | Admitting: Physical Therapy

## 2018-05-09 ENCOUNTER — Ambulatory Visit: Payer: Medicare PPO | Attending: Orthopedic Surgery | Admitting: Physical Therapy

## 2018-05-09 ENCOUNTER — Encounter: Payer: Self-pay | Admitting: Physical Therapy

## 2018-05-09 DIAGNOSIS — M25561 Pain in right knee: Secondary | ICD-10-CM | POA: Diagnosis not present

## 2018-05-09 DIAGNOSIS — R600 Localized edema: Secondary | ICD-10-CM | POA: Diagnosis not present

## 2018-05-09 DIAGNOSIS — R262 Difficulty in walking, not elsewhere classified: Secondary | ICD-10-CM | POA: Diagnosis not present

## 2018-05-09 DIAGNOSIS — M25661 Stiffness of right knee, not elsewhere classified: Secondary | ICD-10-CM | POA: Insufficient documentation

## 2018-05-09 NOTE — Therapy (Signed)
[place] [address] [place], [place], [address] Phone: [phone]   Fax:  [phone]  Physical Therapy Evaluation  Patient Details  Name: Keyarra Rendall MRN: [id number] Date of Birth: 27-Feb-1926 Referring Provider: Dorna Leitz   Encounter Date: 05/09/2018  PT End of Session - 05/09/18 1712    Visit Number  1    Date for PT Re-Evaluation  07/09/18    PT Start Time  1645    PT Stop Time  1739    PT Time Calculation (min)  54 min    Activity Tolerance  Patient tolerated treatment well    Behavior During Therapy  [facility] for tasks assessed/performed       Past Medical History:  Diagnosis Date  . Aortic atherosclerosis ([place])   . Cancer [facility])    right breast  . Diabetes mellitus without complication ([place]) 8341   type 2  . Glaucoma    left eye  . Hematuria 10/31/2013  . History of external beam radiation therapy 07/21/17- 08/11/17   Right Breast 2.66 Gy X 16 fractions  . Hypertension   . Jaundice    in 1947  . Jaundice 1946  . Measles as a child  . Multinodular goiter   . Mumps as a child  . OA (osteoarthritis)    right knee, torn meniscus lateral and medial  . Other and unspecified hyperlipidemia 07/31/2013  . Physical exam, annual 08/02/2013   Sees Dr Trinna Post at [facility] Dr Farris Has, Retinal specialist Dermatolgy  . Thyroid nodule   . Wears glasses   . Wears partial dentures     Past Surgical History:  Procedure Laterality Date  . BREAST LUMPECTOMY WITH RADIOACTIVE SEED LOCALIZATION Right 06/14/2017   Procedure: [place] BREAST LUMPECTOMY WITH RADIOACTIVE SEED LOCALIZATION ERAS PATHWAY;  Surgeon: Jovita Kussmaul, MD;  Location: [place];  Service: General;  Laterality: Right;  . COLONOSCOPY  09/2015  . EYE SURGERY  2009 and 2010   cataract both eyes  . EYE SURGERY  2012   laser to left eye, chalazion was removed with laser  . KNEE SURGERY Right    medial meniscus and lateral menicus  .  MULTIPLE TOOTH EXTRACTIONS    . PARTIAL KNEE ARTHROPLASTY Right 04/08/2018   Procedure: RIGHT UNICOMPARTMENTAL KNEE;  Surgeon: Dorna Leitz, MD;  [facility];  Service: Orthopedics;  Laterality: Right;  . TONSILLECTOMY  84 yrs old    There were no vitals filed for this visit.   Subjective Assessment - 05/09/18 1651    Subjective  Patient had a right partial knee replacement on 04/08/18.  She reports that she had PT at home for about a month.  She reports that she has been having difficulty getting up from sitting and getting started walking    Limitations  Walking;House hold activities    Patient Stated Goals  walk better, have normal ROM, not use the cane    Currently in Pain?  Yes    Pain Score  1     Pain Location  Knee    Pain Orientation  Right    Pain Descriptors / Indicators  Aching;Sore    Pain Type  Acute pain;Surgical pain    Pain Onset  More than a month ago    Pain Frequency  Intermittent    Aggravating Factors   pain up to 9-10/10 after hard exercises or a lot of walking    Pain Relieving Factors  ice, rest  can get the pain down to 0/10    Effect of Pain on Daily Activities  limits everything, just started driving yesterday         [facility] PT Assessment - 05/09/18 0001      Assessment   Medical Diagnosis  s/p right partial knee replacement    Referring Provider  Dorna Leitz    Onset Date/Surgical Date  04/08/18    Prior Therapy  fpor back and balance in June of this year      Precautions   Precautions  None      Balance Screen   Has the patient fallen in the past 6 months  No    Has the patient had a decrease in activity level because of a fear of falling?   No    Is the patient reluctant to leave their home because of a fear of falling?   No      Home Environment   Additional Comments  2 steps into the home, does housework, some yardwork      Prior Function   Level of Independence  Independent    Vocation  Retired    Leisure  bike at [employer]       Observation/Other Assessments-Edema    Edema  Circumferential      Circumferential Edema   Circumferential - Right  42cm mid patella    Circumferential - Left   38 cm      ROM / Strength   AROM / PROM / Strength  AROM;PROM;Strength      AROM   AROM Assessment Site  Knee    Right/Left Knee  Right    Right Knee Extension  18    Right Knee Flexion  94      PROM   PROM Assessment Site  Knee    Right/Left Knee  Right    Right Knee Extension  7    Right Knee Flexion  102      Strength   Overall Strength Comments  4/5 for the right knee with mild discomfort      Palpation   Palpation comment  some puckering distal scar, warm to touch, some tenderness over the scar.      Ambulation/Gait   Gait Comments  uses a SPC, sloe, very sloe with the first fes steps snd difficulty getting up and turning from sitting.      Standardized Balance Assessment   Standardized Balance Assessment  Timed Up and Go Test      Timed Up and Go Test   Normal TUG (seconds)  33    TUG Comments  very slow getting up from sitting and getting started, also difficulty turning around                Objective measurements completed on examination: See above findings.      [place] Adult PT Treatment/Exercise - 05/09/18 0001      Knee/Hip Exercises: Aerobic   Recumbent Bike  x 36min partial revolutions    Nustep  L2 x4 min       Modalities   Modalities  Vasopneumatic      Vasopneumatic   Number Minutes Vasopneumatic   15 minutes    Vasopnuematic Location   Knee    Vasopneumatic Pressure  Medium    Vasopneumatic Temperature   40               PT Short Term Goals - 05/09/18 1723      PT  SHORT TERM GOAL #1   Title  independent with intiial HEP    Time  2    Period  Weeks    Status  New        PT Long Term Goals - 05/09/18 1723      PT LONG TERM GOAL #1   Title  decrease pain  50%    Time  8    Period  Weeks    Status  New      PT LONG TERM GOAL #2   Title  understand  RICE    Time  8    Period  Weeks    Status  New      PT LONG TERM GOAL #3   Title  decrease TUG test to 15 seconds    Time  8    Period  Weeks      PT LONG TERM GOAL #4   Title  increase AROM of the right knee to 5-115 degrees flexion    Time  8    Period  Weeks    Status  New      PT LONG TERM GOAL #5   Title  walk without cane for most distances    Time  8    Period  Weeks    Status  New             Plan - 05/09/18 1720    Clinical Impression Statement  Patient underwent a right partial knee replacement on 04/08/18.  She had home PT until last week.  She has a lot of difficulty getting up from sitting and with getting started walking, reflected in her TUG time being 33 seconds.  Her AROM was limited with tightness in the anterior and posterior knee.  She has swelling, redness and warmth with the distal scar being tight    Clinical Presentation  Evolving    Clinical Decision Making  Low    Rehab Potential  Good    PT Frequency  3x / week    PT Duration  8 weeks    PT Treatment/Interventions  ADLs/Self Care Home Management;Electrical Stimulation;Moist Heat;Therapeutic exercise;Therapeutic activities;Functional mobility training;Patient/family education;Manual techniques;Gait training;Stair training;Balance training;Neuromuscular re-education;Scar mobilization;Vasopneumatic Device    PT Next Visit Plan  start gym rehab to gain ROM, function and strength, seems to need confidence    Consulted and Agree with Plan of Care  Patient       Patient will benefit from skilled therapeutic intervention in order to improve the following deficits and impairments:  Abnormal gait, Decreased range of motion, Difficulty walking, Decreased endurance, Decreased activity tolerance, Pain, Impaired flexibility, Decreased scar mobility, Decreased balance, Decreased mobility, Decreased strength, Increased edema  Visit Diagnosis: Acute pain of right knee - Plan: PT plan of care  cert/re-cert  Stiffness of right knee, not elsewhere classified - Plan: PT plan of care cert/re-cert  Difficulty in walking, not elsewhere classified - Plan: PT plan of care cert/re-cert  Localized edema - Plan: PT plan of care cert/re-cert     Problem List Patient Active Problem List   Diagnosis Date Noted  . Primary osteoarthritis of right knee 04/08/2018  . Malignant neoplasm of upper-outer quadrant of right breast in female, estrogen receptor positive ([place]) 06/29/2017  . Unspecified vitamin D deficiency 11/27/2013  . Hematuria 10/31/2013  . Physical exam, annual 08/02/2013  . Other and unspecified hyperlipidemia 07/31/2013  . Diabetes mellitus without complication ([place])   . Hypertension   . Arthritis  Sumner Boast., PT 05/09/2018, 5:30 PM  [place], [place], [address] Phone: [phone]   Fax:  [phone]  Name: Jolinda Pinkstaff MRN: [id number] Date of Birth: 07-15-1926

## 2018-05-12 ENCOUNTER — Ambulatory Visit: Payer: Medicare PPO | Admitting: Physical Therapy

## 2018-05-12 DIAGNOSIS — M25561 Pain in right knee: Secondary | ICD-10-CM

## 2018-05-12 DIAGNOSIS — R262 Difficulty in walking, not elsewhere classified: Secondary | ICD-10-CM | POA: Diagnosis not present

## 2018-05-12 DIAGNOSIS — M25661 Stiffness of right knee, not elsewhere classified: Secondary | ICD-10-CM

## 2018-05-12 DIAGNOSIS — R600 Localized edema: Secondary | ICD-10-CM | POA: Diagnosis not present

## 2018-05-12 NOTE — Therapy (Signed)
[place], [place], [address] Phone: [phone]   Fax:  [phone]  Physical Therapy Treatment  Patient Details  Name: Kristen Hill MRN: [id number] Date of Birth: 1925/12/01 Referring Provider: Dorna Leitz   Encounter Date: 05/12/2018  PT End of Session - 05/12/18 1601    Visit Number  2    Date for PT Re-Evaluation  07/09/18    PT Start Time  1520    PT Stop Time  1620    PT Time Calculation (min)  60 min       Past Medical History:  Diagnosis Date  . Aortic atherosclerosis ([place])   . Cancer [facility])    right breast  . Diabetes mellitus without complication ([place]) 0254   type 2  . Glaucoma    left eye  . Hematuria 10/31/2013  . History of external beam radiation therapy 07/21/17- 08/11/17   Right Breast 2.66 Gy X 16 fractions  . Hypertension   . Jaundice    in 1947  . Jaundice 1946  . Measles as a child  . Multinodular goiter   . Mumps as a child  . OA (osteoarthritis)    right knee, torn meniscus lateral and medial  . Other and unspecified hyperlipidemia 07/31/2013  . Physical exam, annual 08/02/2013   Sees Dr Trinna Post at [facility] Dr Farris Has, Retinal specialist Dermatolgy  . Thyroid nodule   . Wears glasses   . Wears partial dentures     Past Surgical History:  Procedure Laterality Date  . BREAST LUMPECTOMY WITH RADIOACTIVE SEED LOCALIZATION Right 06/14/2017   Procedure: [place] BREAST LUMPECTOMY WITH RADIOACTIVE SEED LOCALIZATION ERAS PATHWAY;  Surgeon: Jovita Kussmaul, MD;  Location: [place];  Service: General;  Laterality: Right;  . COLONOSCOPY  09/2015  . EYE SURGERY  2009 and 2010   cataract both eyes  . EYE SURGERY  2012   laser to left eye, chalazion was removed with laser  . KNEE SURGERY Right    medial meniscus and lateral menicus  . MULTIPLE TOOTH EXTRACTIONS    . PARTIAL KNEE ARTHROPLASTY Right 04/08/2018   Procedure: RIGHT UNICOMPARTMENTAL KNEE;   Surgeon: Dorna Leitz, MD;  [facility];  Service: Orthopedics;  Laterality: Right;  . TONSILLECTOMY  84 yrs old    There were no vitals filed for this visit.  Subjective Assessment - 05/12/18 1527    Subjective  stiffenss and trouble getting up and starting walk. some decreased balance    Currently in Pain?  No/denies                       [facility] Adult PT Treatment/Exercise - 05/12/18 0001      Exercises   Exercises  Lumbar;Knee/Hip      Knee/Hip Exercises: Aerobic   Recumbent Bike  5 min full rev    Nustep  L 4 6 min      Knee/Hip Exercises: Machines for Strengthening   Cybex Knee Extension  5lb 2x10     Cybex Knee Flexion  15# 2 x 10    Cybex Leg Press  20lb 2x10       Knee/Hip Exercises: Standing   Heel Raises  Both;15 reps   HHA on airex   Functional Squat  15 reps   HHA on airex   Other Standing Knee Exercises  HHA on airex marching, hip flex,ext and abd alt 20x  Modalities   Modalities  Vasopneumatic      Vasopneumatic   Number Minutes Vasopneumatic   15 minutes    Vasopnuematic Location   Knee    Vasopneumatic Pressure  Medium    Vasopneumatic Temperature   40               PT Short Term Goals - 05/09/18 1723      PT SHORT TERM GOAL #1   Title  independent with intiial HEP    Time  2    Period  Weeks    Status  New        PT Long Term Goals - 05/09/18 1723      PT LONG TERM GOAL #1   Title  decrease pain  50%    Time  8    Period  Weeks    Status  New      PT LONG TERM GOAL #2   Title  understand RICE    Time  8    Period  Weeks    Status  New      PT LONG TERM GOAL #3   Title  decrease TUG test to 15 seconds    Time  8    Period  Weeks      PT LONG TERM GOAL #4   Title  increase AROM of the right knee to 5-115 degrees flexion    Time  8    Period  Weeks    Status  New      PT LONG TERM GOAL #5   Title  walk without cane for most distances    Time  8    Period  Weeks    Status  New             Plan - 05/12/18 1601    Clinical Impression Statement  pt with LOB 3 times transitioning off machines and airex. tolerated ex well but very fatigued.    PT Treatment/Interventions  ADLs/Self Care Home Management;Electrical Stimulation;Moist Heat;Therapeutic exercise;Therapeutic activities;Functional mobility training;Patient/family education;Manual techniques;Gait training;Stair training;Balance training;Neuromuscular re-education;Scar mobilization;Vasopneumatic Device    PT Next Visit Plan  start gym rehab to gain ROM, function and strength, seems to need confidence       Patient will benefit from skilled therapeutic intervention in order to improve the following deficits and impairments:  Abnormal gait, Decreased range of motion, Difficulty walking, Decreased endurance, Decreased activity tolerance, Pain, Impaired flexibility, Decreased scar mobility, Decreased balance, Decreased mobility, Decreased strength, Increased edema  Visit Diagnosis: Acute pain of right knee  Stiffness of right knee, not elsewhere classified  Difficulty in walking, not elsewhere classified     Problem List Patient Active Problem List   Diagnosis Date Noted  . Primary osteoarthritis of right knee 04/08/2018  . Malignant neoplasm of upper-outer quadrant of right breast in female, estrogen receptor positive ([place]) 06/29/2017  . Unspecified vitamin D deficiency 11/27/2013  . Hematuria 10/31/2013  . Physical exam, annual 08/02/2013  . Other and unspecified hyperlipidemia 07/31/2013  . Diabetes mellitus without complication ([place])   . Hypertension   . Arthritis     [name],[name] PTA 05/12/2018, 4:03 PM  [place], [place], [address] Phone: [phone]   Fax:  [phone]  Name: Kristen Hill MRN: [id number] Date of Birth: 1926/09/11

## 2018-05-16 ENCOUNTER — Encounter: Payer: Medicare PPO | Admitting: Physical Therapy

## 2018-05-17 ENCOUNTER — Encounter: Payer: Self-pay | Admitting: Physical Therapy

## 2018-05-17 ENCOUNTER — Ambulatory Visit: Payer: Medicare PPO | Admitting: Physical Therapy

## 2018-05-17 DIAGNOSIS — M25661 Stiffness of right knee, not elsewhere classified: Secondary | ICD-10-CM | POA: Diagnosis not present

## 2018-05-17 DIAGNOSIS — R600 Localized edema: Secondary | ICD-10-CM

## 2018-05-17 DIAGNOSIS — R262 Difficulty in walking, not elsewhere classified: Secondary | ICD-10-CM | POA: Diagnosis not present

## 2018-05-17 DIAGNOSIS — M25561 Pain in right knee: Secondary | ICD-10-CM

## 2018-05-17 NOTE — Therapy (Signed)
[place], [place], [address] Phone: [phone]   Fax:  [phone]  Physical Therapy Treatment  Patient Details  Name: Kristen Hill MRN: [id number] Date of Birth: Feb 07, 1926 Referring Provider: Dorna Leitz   Encounter Date: 05/17/2018  PT End of Session - 05/17/18 1122    Visit Number  3    Date for PT Re-Evaluation  07/09/18    PT Start Time  6962    PT Stop Time  1145    PT Time Calculation (min)  58 min       Past Medical History:  Diagnosis Date  . Aortic atherosclerosis ([place])   . Cancer [facility])    right breast  . Diabetes mellitus without complication ([place]) 9528   type 2  . Glaucoma    left eye  . Hematuria 10/31/2013  . History of external beam radiation therapy 07/21/17- 08/11/17   Right Breast 2.66 Gy X 16 fractions  . Hypertension   . Jaundice    in 1947  . Jaundice 1946  . Measles as a child  . Multinodular goiter   . Mumps as a child  . OA (osteoarthritis)    right knee, torn meniscus lateral and medial  . Other and unspecified hyperlipidemia 07/31/2013  . Physical exam, annual 08/02/2013   Sees Dr Trinna Post at [facility] Dr Farris Has, Retinal specialist Dermatolgy  . Thyroid nodule   . Wears glasses   . Wears partial dentures     Past Surgical History:  Procedure Laterality Date  . BREAST LUMPECTOMY WITH RADIOACTIVE SEED LOCALIZATION Right 06/14/2017   Procedure: [place] BREAST LUMPECTOMY WITH RADIOACTIVE SEED LOCALIZATION ERAS PATHWAY;  Surgeon: Jovita Kussmaul, MD;  Location: [place];  Service: General;  Laterality: Right;  . COLONOSCOPY  09/2015  . EYE SURGERY  2009 and 2010   cataract both eyes  . EYE SURGERY  2012   laser to left eye, chalazion was removed with laser  . KNEE SURGERY Right    medial meniscus and lateral menicus  . MULTIPLE TOOTH EXTRACTIONS    . PARTIAL KNEE ARTHROPLASTY Right 04/08/2018   Procedure: RIGHT UNICOMPARTMENTAL KNEE;   Surgeon: Dorna Leitz, MD;  [facility];  Service: Orthopedics;  Laterality: Right;  . TONSILLECTOMY  84 yrs old    There were no vitals filed for this visit.  Subjective Assessment - 05/17/18 1053    Subjective  doing well , stiff not comfortable without SPC    Currently in Pain?  Yes    Pain Score  1     Pain Location  Knee    Pain Orientation  Right    Pain Descriptors / Indicators  Tightness;Sore                       [facility] Adult PT Treatment/Exercise - 05/17/18 0001      High Level Balance   High Level Balance Activities  Direction changes;Negotitating around obstacles;Negotiating over obstacles      Exercises   Exercises  Lumbar;Knee/Hip      Knee/Hip Exercises: Aerobic   Elliptical  I 5 R 3 3 min   pt with elliptical at home- educ ot start slow,no UE    Recumbent Bike  5 min full rev   airdyne at home- okayed to do at home   Nustep  L 3 6 min- LE only      Knee/Hip Exercises: Machines for Strengthening  Cybex Knee Extension  5lb 2x10     Cybex Knee Flexion  15# 2 x 10    Cybex Leg Press  20lb 2x15      Modalities   Modalities  Vasopneumatic      Vasopneumatic   Number Minutes Vasopneumatic   15 minutes    Vasopnuematic Location   Knee    Vasopneumatic Pressure  Medium    Vasopneumatic Temperature   40               PT Short Term Goals - 05/09/18 1723      PT SHORT TERM GOAL #1   Title  independent with intiial HEP    Time  2    Period  Weeks    Status  New        PT Long Term Goals - 05/09/18 1723      PT LONG TERM GOAL #1   Title  decrease pain  50%    Time  8    Period  Weeks    Status  New      PT LONG TERM GOAL #2   Title  understand RICE    Time  8    Period  Weeks    Status  New      PT LONG TERM GOAL #3   Title  decrease TUG test to 15 seconds    Time  8    Period  Weeks      PT LONG TERM GOAL #4   Title  increase AROM of the right knee to 5-115 degrees flexion    Time  8    Period  Weeks     Status  New      PT LONG TERM GOAL #5   Title  walk without cane for most distances    Time  8    Period  Weeks    Status  New            Plan - 05/17/18 1123    Clinical Impression Statement  tolerated ther ex well with improved strength noted by increased ROM . difficulty with with neg obstacles and directional changes.    PT Treatment/Interventions  ADLs/Self Care Home Management;Electrical Stimulation;Moist Heat;Therapeutic exercise;Therapeutic activities;Functional mobility training;Patient/family education;Manual techniques;Gait training;Stair training;Balance training;Neuromuscular re-education;Scar mobilization;Vasopneumatic Device    PT Next Visit Plan  assess ROM and goals       Patient will benefit from skilled therapeutic intervention in order to improve the following deficits and impairments:  Abnormal gait, Decreased range of motion, Difficulty walking, Decreased endurance, Decreased activity tolerance, Pain, Impaired flexibility, Decreased scar mobility, Decreased balance, Decreased mobility, Decreased strength, Increased edema  Visit Diagnosis: Acute pain of right knee  Stiffness of right knee, not elsewhere classified  Difficulty in walking, not elsewhere classified  Localized edema     Problem List Patient Active Problem List   Diagnosis Date Noted  . Primary osteoarthritis of right knee 04/08/2018  . Malignant neoplasm of upper-outer quadrant of right breast in female, estrogen receptor positive ([place]) 06/29/2017  . Unspecified vitamin D deficiency 11/27/2013  . Hematuria 10/31/2013  . Physical exam, annual 08/02/2013  . Other and unspecified hyperlipidemia 07/31/2013  . Diabetes mellitus without complication ([place])   . Hypertension   . Arthritis     [name],[name] PTA 05/17/2018, 11:26 AM  [place], [place], [address] Phone: [phone]   Fax:   [phone]  Name:  Kristen Hill MRN: [id number] Date of Birth: October 05, 1925

## 2018-05-19 ENCOUNTER — Encounter: Payer: Medicare PPO | Admitting: Physical Therapy

## 2018-05-19 ENCOUNTER — Ambulatory Visit: Payer: Medicare PPO | Admitting: Physical Therapy

## 2018-05-19 ENCOUNTER — Encounter: Payer: Self-pay | Admitting: Physical Therapy

## 2018-05-19 DIAGNOSIS — M25661 Stiffness of right knee, not elsewhere classified: Secondary | ICD-10-CM | POA: Diagnosis not present

## 2018-05-19 DIAGNOSIS — R600 Localized edema: Secondary | ICD-10-CM

## 2018-05-19 DIAGNOSIS — R262 Difficulty in walking, not elsewhere classified: Secondary | ICD-10-CM | POA: Diagnosis not present

## 2018-05-19 DIAGNOSIS — M25561 Pain in right knee: Secondary | ICD-10-CM | POA: Diagnosis not present

## 2018-05-19 NOTE — Therapy (Signed)
[place], [place], [address] Phone: [phone]   Fax:  [phone]  Physical Therapy Treatment  Patient Details  Name: Kristen Hill MRN: [id number] Date of Birth: November 19, 1925 Referring Provider: Dorna Leitz   Encounter Date: 05/19/2018  PT End of Session - 05/19/18 1109    Visit Number  4    Date for PT Re-Evaluation  07/09/18    PT Start Time  7654    PT Stop Time  1140    PT Time Calculation (min)  57 min       Past Medical History:  Diagnosis Date  . Aortic atherosclerosis ([place])   . Cancer [facility])    right breast  . Diabetes mellitus without complication ([place]) 6503   type 2  . Glaucoma    left eye  . Hematuria 10/31/2013  . History of external beam radiation therapy 07/21/17- 08/11/17   Right Breast 2.66 Gy X 16 fractions  . Hypertension   . Jaundice    in 1947  . Jaundice 1946  . Measles as a child  . Multinodular goiter   . Mumps as a child  . OA (osteoarthritis)    right knee, torn meniscus lateral and medial  . Other and unspecified hyperlipidemia 07/31/2013  . Physical exam, annual 08/02/2013   Sees Dr Trinna Post at [facility] Dr Farris Has, Retinal specialist Dermatolgy  . Thyroid nodule   . Wears glasses   . Wears partial dentures     Past Surgical History:  Procedure Laterality Date  . BREAST LUMPECTOMY WITH RADIOACTIVE SEED LOCALIZATION Right 06/14/2017   Procedure: [place] BREAST LUMPECTOMY WITH RADIOACTIVE SEED LOCALIZATION ERAS PATHWAY;  Surgeon: Jovita Kussmaul, MD;  Location: [place];  Service: General;  Laterality: Right;  . COLONOSCOPY  09/2015  . EYE SURGERY  2009 and 2010   cataract both eyes  . EYE SURGERY  2012   laser to left eye, chalazion was removed with laser  . KNEE SURGERY Right    medial meniscus and lateral menicus  . MULTIPLE TOOTH EXTRACTIONS    . PARTIAL KNEE ARTHROPLASTY Right 04/08/2018   Procedure: RIGHT UNICOMPARTMENTAL KNEE;   Surgeon: Dorna Leitz, MD;  [facility];  Service: Orthopedics;  Laterality: Right;  . TONSILLECTOMY  84 yrs old    There were no vitals filed for this visit.  Subjective Assessment - 05/19/18 1044    Subjective  stiff and hesitatnt when first standing, front steps at house are getting better    Currently in Pain?  Yes    Pain Score  1     Pain Location  Knee    Pain Orientation  Right         [facility] PT Assessment - 05/19/18 0001      AROM   AROM Assessment Site  Knee    Right/Left Knee  Right    Right Knee Extension  5    Right Knee Flexion  112                   [facility] Adult PT Treatment/Exercise - 05/19/18 0001      High Level Balance   High Level Balance Comments  --   airex ball toss, ball kick- min A good righting rxn     Exercises   Exercises  Lumbar;Knee/Hip      Knee/Hip Exercises: Aerobic   Recumbent Bike  6 min full rev- seated # 3   going  to try bike at home this weekend   Nustep  L 4 6 min- LE only      Knee/Hip Exercises: Machines for Strengthening   Cybex Knee Extension  5lb 3x10     Cybex Knee Flexion  15# 3 x 10    Cybex Leg Press  20lb 2x15      Knee/Hip Exercises: Standing   Other Standing Knee Exercises  airex red tband hip 3 way 15 each   freq rest needed d/t fatigue     Modalities   Modalities  Vasopneumatic      Vasopneumatic   Number Minutes Vasopneumatic   15 minutes    Vasopnuematic Location   Knee    Vasopneumatic Pressure  Medium    Vasopneumatic Temperature   40               PT Short Term Goals - 05/19/18 1109      PT SHORT TERM GOAL #1   Title  independent with intiial HEP    Baseline  doing HEP from HHPT plus bike    Status  Achieved        PT Long Term Goals - 05/09/18 1723      PT LONG TERM GOAL #1   Title  decrease pain  50%    Time  8    Period  Weeks    Status  New      PT LONG TERM GOAL #2   Title  understand RICE    Time  8    Period  Weeks    Status  New      PT LONG TERM  GOAL #3   Title  decrease TUG test to 15 seconds    Time  8    Period  Weeks      PT LONG TERM GOAL #4   Title  increase AROM of the right knee to 5-115 degrees flexion    Time  8    Period  Weeks    Status  New      PT LONG TERM GOAL #5   Title  walk without cane for most distances    Time  8    Period  Weeks    Status  New            Plan - 05/19/18 1116    Clinical Impression Statement  STG met. Excellent increase in ROM. Pt fatigued with hip ex . added extra set to machine ex and did well. min A needed with balance on airex but good righting reaction. still slow with transition to gait upon standing and today pts Left knee alomost buckled.    PT Treatment/Interventions  ADLs/Self Care Home Management;Electrical Stimulation;Moist Heat;Therapeutic exercise;Therapeutic activities;Functional mobility training;Patient/family education;Manual techniques;Gait training;Stair training;Balance training;Neuromuscular re-education;Scar mobilization;Vasopneumatic Device    PT Next Visit Plan  balance/strenth/endurance       Patient will benefit from skilled therapeutic intervention in order to improve the following deficits and impairments:  Abnormal gait, Decreased range of motion, Difficulty walking, Decreased endurance, Decreased activity tolerance, Pain, Impaired flexibility, Decreased scar mobility, Decreased balance, Decreased mobility, Decreased strength, Increased edema  Visit Diagnosis: Acute pain of right knee  Stiffness of right knee, not elsewhere classified  Difficulty in walking, not elsewhere classified  Localized edema     Problem List Patient Active Problem List   Diagnosis Date Noted  . Primary osteoarthritis of right knee 04/08/2018  . Malignant neoplasm of upper-outer quadrant of right breast in female,  estrogen receptor positive ([place]) 06/29/2017  . Unspecified vitamin D deficiency 11/27/2013  . Hematuria 10/31/2013  . Physical exam, annual 08/02/2013   . Other and unspecified hyperlipidemia 07/31/2013  . Diabetes mellitus without complication ([place])   . Hypertension   . Arthritis     [name],[name] PTA 05/19/2018, 11:18 AM  [place], [place], [address] Phone: [phone]   Fax:  [phone]  Name: Kristen Hill MRN: [id number] Date of Birth: 31-May-1926

## 2018-05-24 ENCOUNTER — Ambulatory Visit: Payer: Medicare PPO | Admitting: Physical Therapy

## 2018-05-24 ENCOUNTER — Encounter: Payer: Self-pay | Admitting: Physical Therapy

## 2018-05-24 DIAGNOSIS — R262 Difficulty in walking, not elsewhere classified: Secondary | ICD-10-CM

## 2018-05-24 DIAGNOSIS — R600 Localized edema: Secondary | ICD-10-CM | POA: Diagnosis not present

## 2018-05-24 DIAGNOSIS — M25561 Pain in right knee: Secondary | ICD-10-CM

## 2018-05-24 DIAGNOSIS — M25661 Stiffness of right knee, not elsewhere classified: Secondary | ICD-10-CM

## 2018-05-24 NOTE — Therapy (Signed)
[place], [place], [address] Phone: [phone]   Fax:  [phone]  Physical Therapy Treatment  Patient Details  Name: Kristen Hill MRN: [id number] Date of Birth: 07-31-1926 Referring Provider: Dorna Leitz   Encounter Date: 05/24/2018  PT End of Session - 05/24/18 1637    Visit Number  5    Date for PT Re-Evaluation  07/09/18    PT Start Time  1527    PT Stop Time  1630    PT Time Calculation (min)  63 min    Activity Tolerance  Patient tolerated treatment well    Behavior During Therapy  [facility] for tasks assessed/performed       Past Medical History:  Diagnosis Date  . Aortic atherosclerosis ([place])   . Cancer [facility])    right breast  . Diabetes mellitus without complication ([place]) 2706   type 2  . Glaucoma    left eye  . Hematuria 10/31/2013  . History of external beam radiation therapy 07/21/17- 08/11/17   Right Breast 2.66 Gy X 16 fractions  . Hypertension   . Jaundice    in 1947  . Jaundice 1946  . Measles as a child  . Multinodular goiter   . Mumps as a child  . OA (osteoarthritis)    right knee, torn meniscus lateral and medial  . Other and unspecified hyperlipidemia 07/31/2013  . Physical exam, annual 08/02/2013   Sees Dr Trinna Post at [facility] Dr Farris Has, Retinal specialist Dermatolgy  . Thyroid nodule   . Wears glasses   . Wears partial dentures     Past Surgical History:  Procedure Laterality Date  . BREAST LUMPECTOMY WITH RADIOACTIVE SEED LOCALIZATION Right 06/14/2017   Procedure: [place] BREAST LUMPECTOMY WITH RADIOACTIVE SEED LOCALIZATION ERAS PATHWAY;  Surgeon: Jovita Kussmaul, MD;  Location: [place];  Service: General;  Laterality: Right;  . COLONOSCOPY  09/2015  . EYE SURGERY  2009 and 2010   cataract both eyes  . EYE SURGERY  2012   laser to left eye, chalazion was removed with laser  . KNEE SURGERY Right    medial meniscus and lateral menicus  .  MULTIPLE TOOTH EXTRACTIONS    . PARTIAL KNEE ARTHROPLASTY Right 04/08/2018   Procedure: RIGHT UNICOMPARTMENTAL KNEE;  Surgeon: Dorna Leitz, MD;  [facility];  Service: Orthopedics;  Laterality: Right;  . TONSILLECTOMY  84 yrs old    There were no vitals filed for this visit.  Subjective Assessment - 05/24/18 1528    Subjective  Reports trying to walk without cane, "I don't feel safe yet", "unsure of myself"    Currently in Pain?  Yes    Pain Score  2     Pain Location  Knee    Pain Orientation  Right    Pain Descriptors / Indicators  Sore                       [facility] Adult PT Treatment/Exercise - 05/24/18 0001      Ambulation/Gait   Gait Comments  gait with SBA/S donw the stairs then outside and up the hill back into the building, using SPC at times, she was very slow and fearful especially when having to negotiate a curb, some gait with HHA to speed her up, cues to not shuffle      High Level Balance   High Level Balance Activities  Side stepping;Backward walking;Negotiating over obstacles  High Level Balance Comments  ball kicks in narrow hall for her to hold onto walls      Knee/Hip Exercises: Aerobic   Recumbent Bike  6 min full rev- seated # 3    Nustep  L 4 6 min- LE only      Modalities   Modalities  Vasopneumatic      Vasopneumatic   Number Minutes Vasopneumatic   15 minutes    Vasopnuematic Location   Knee    Vasopneumatic Pressure  Medium    Vasopneumatic Temperature   40      Manual Therapy   Manual Therapy  Soft tissue mobilization;Joint mobilization    Joint Mobilization  gentle distraction with passive flexion    Soft tissue mobilization  to the scar               PT Short Term Goals - 05/19/18 1109      PT SHORT TERM GOAL #1   Title  independent with intiial HEP    Baseline  doing HEP from HHPT plus bike    Status  Achieved        PT Long Term Goals - 05/24/18 1642      PT LONG TERM GOAL #1   Title  decrease pain   50%    Status  On-going      PT LONG TERM GOAL #2   Title  understand [place]    Status  Achieved            Plan - 05/24/18 1641    Clinical Impression Statement  Patient has a lot of fear with curbs, she is hesitant with her first few steps and with transfers, she does a lot of small shuffling steps.  She was very stiff when starting on the bike but this loosened up in the first minute.  Shge is fearful of the airex and stepping over objects    PT Next Visit Plan  balance/strenth/endurance, gait with and without cane, may be able to try stairs step over step    Consulted and Agree with Plan of Care  Patient       Patient will benefit from skilled therapeutic intervention in order to improve the following deficits and impairments:  Abnormal gait, Decreased range of motion, Difficulty walking, Decreased endurance, Decreased activity tolerance, Pain, Impaired flexibility, Decreased scar mobility, Decreased balance, Decreased mobility, Decreased strength, Increased edema  Visit Diagnosis: Acute pain of right knee  Stiffness of right knee, not elsewhere classified  Difficulty in walking, not elsewhere classified  Localized edema     Problem List Patient Active Problem List   Diagnosis Date Noted  . Primary osteoarthritis of right knee 04/08/2018  . Malignant neoplasm of upper-outer quadrant of right breast in female, estrogen receptor positive ([place]) 06/29/2017  . Unspecified vitamin D deficiency 11/27/2013  . Hematuria 10/31/2013  . Physical exam, annual 08/02/2013  . Other and unspecified hyperlipidemia 07/31/2013  . Diabetes mellitus without complication ([place])   . Hypertension   . Arthritis     Sumner Boast., PT 05/24/2018, 4:43 PM  [place], [place], [address] Phone: [phone]   Fax:  [phone]  Name: Kristen Hill MRN: [id number] Date of Birth: January 20, 1926

## 2018-05-26 ENCOUNTER — Ambulatory Visit: Payer: Medicare PPO | Admitting: Physical Therapy

## 2018-05-26 ENCOUNTER — Encounter: Payer: Self-pay | Admitting: Physical Therapy

## 2018-05-26 DIAGNOSIS — R262 Difficulty in walking, not elsewhere classified: Secondary | ICD-10-CM

## 2018-05-26 DIAGNOSIS — M25661 Stiffness of right knee, not elsewhere classified: Secondary | ICD-10-CM | POA: Diagnosis not present

## 2018-05-26 DIAGNOSIS — M25561 Pain in right knee: Secondary | ICD-10-CM

## 2018-05-26 DIAGNOSIS — R600 Localized edema: Secondary | ICD-10-CM | POA: Diagnosis not present

## 2018-05-26 NOTE — Therapy (Signed)
[place], [place], [address] Phone: [phone]   Fax:  [phone]  Physical Therapy Treatment  Patient Details  Name: Kristen Hill MRN: [id number] Date of Birth: [birth year] Referring Provider: Dorna Leitz   Encounter Date: 05/26/2018  PT End of Session - 05/26/18 1123    Visit Number  6    Date for PT Re-Evaluation  07/09/18    PT Start Time  1050    PT Stop Time  1145    PT Time Calculation (min)  55 min       Past Medical History:  Diagnosis Date  . Aortic atherosclerosis ([place])   . Cancer [facility])    right breast  . Diabetes mellitus without complication ([place]) 1497   type 2  . Glaucoma    left eye  . Hematuria 10/31/2013  . History of external beam radiation therapy 07/21/17- 08/11/17   Right Breast 2.66 Gy X 16 fractions  . Hypertension   . Jaundice    in 1947  . Jaundice 1946  . Measles as a child  . Multinodular goiter   . Mumps as a child  . OA (osteoarthritis)    right knee, torn meniscus lateral and medial  . Other and unspecified hyperlipidemia 07/31/2013  . Physical exam, annual 08/02/2013   Sees Dr Trinna Post at [facility] Dr Farris Has, Retinal specialist Dermatolgy  . Thyroid nodule   . Wears glasses   . Wears partial dentures     Past Surgical History:  Procedure Laterality Date  . BREAST LUMPECTOMY WITH RADIOACTIVE SEED LOCALIZATION Right 06/14/2017   Procedure: [place] BREAST LUMPECTOMY WITH RADIOACTIVE SEED LOCALIZATION ERAS PATHWAY;  Surgeon: Jovita Kussmaul, MD;  Location: [place];  Service: General;  Laterality: Right;  . COLONOSCOPY  09/2015  . EYE SURGERY  2009 and 2010   cataract both eyes  . EYE SURGERY  2012   laser to left eye, chalazion was removed with laser  . KNEE SURGERY Right    medial meniscus and lateral menicus  . MULTIPLE TOOTH EXTRACTIONS    . PARTIAL KNEE ARTHROPLASTY Right 04/08/2018   Procedure: RIGHT UNICOMPARTMENTAL KNEE;   Surgeon: Dorna Leitz, MD;  [facility];  Service: Orthopedics;  Laterality: Right;  . TONSILLECTOMY  84 yrs old    There were no vitals filed for this visit.  Subjective Assessment - 05/26/18 1058    Subjective  still trying to progress off cane    Currently in Pain?  No/denies                       [facility] Adult PT Treatment/Exercise - 05/26/18 0001      High Level Balance   High Level Balance Activities  Negotitating around obstacles;Negotiating over obstacles    High Level Balance Comments  CGA and hesitancy with stepping over or on obstacles      Exercises   Exercises  Lumbar;Knee/Hip      Knee/Hip Exercises: Aerobic   Recumbent Bike  6 min full rev- seated # 3   tight at start then loosened up   Nustep  L 5 28mn - LE only      Knee/Hip Exercises: Machines for Strengthening   Cybex Knee Extension  10# 2 sets 10    Cybex Knee Flexion  15# RT only 10, 20# B 2 sets 10    Cybex Leg Press  30# 3 sets 10   heel  raises 2 sets 15 30#     Modalities   Modalities  Vasopneumatic      Vasopneumatic   Number Minutes Vasopneumatic   15 minutes    Vasopnuematic Location   Knee    Vasopneumatic Pressure  Medium    Vasopneumatic Temperature   40               PT Short Term Goals - 05/19/18 1109      PT SHORT TERM GOAL #1   Title  independent with intiial HEP    Baseline  doing HEP from HHPT plus bike    Status  Achieved        PT Long Term Goals - 05/26/18 1123      PT LONG TERM GOAL #1   Title  decrease pain  50%    Baseline  75%    Status  Achieved      PT LONG TERM GOAL #2   Title  understand RICE    Status  Achieved      PT LONG TERM GOAL #3   Title  decrease TUG test to 15 seconds    Status  Achieved      PT LONG TERM GOAL #4   Title  increase AROM of the right knee to 5-115 degrees flexion    Status  Partially Met      PT LONG TERM GOAL #5   Title  walk without cane for most distances    Status  Partially Met             Plan - 05/26/18 1124    Clinical Impression Statement  progressing with goals, many met but focus on gait and balance without AD and func ROM. hesitant with neg obstacles and still slow with initail gait progression with standing.    PT Treatment/Interventions  ADLs/Self Care Home Management;Electrical Stimulation;Moist Heat;Therapeutic exercise;Therapeutic activities;Functional mobility training;Patient/family education;Manual techniques;Gait training;Stair training;Balance training;Neuromuscular re-education;Scar mobilization;Vasopneumatic Device    PT Next Visit Plan  balance/strenth/endurance, gait with and without cane, may be able to try stairs step over step       Patient will benefit from skilled therapeutic intervention in order to improve the following deficits and impairments:  Abnormal gait, Decreased range of motion, Difficulty walking, Decreased endurance, Decreased activity tolerance, Pain, Impaired flexibility, Decreased scar mobility, Decreased balance, Decreased mobility, Decreased strength, Increased edema  Visit Diagnosis: Acute pain of right knee  Stiffness of right knee, not elsewhere classified  Difficulty in walking, not elsewhere classified  Localized edema     Problem List Patient Active Problem List   Diagnosis Date Noted  . Primary osteoarthritis of right knee 04/08/2018  . Malignant neoplasm of upper-outer quadrant of right breast in female, estrogen receptor positive ([place]) 06/29/2017  . Unspecified vitamin D deficiency 11/27/2013  . Hematuria 10/31/2013  . Physical exam, annual 08/02/2013  . Other and unspecified hyperlipidemia 07/31/2013  . Diabetes mellitus without complication ([place])   . Hypertension   . Arthritis     Marla Pouliot,[name]  PTA 05/26/2018, 11:27 AM  [place], [place], [address] Phone: [phone]   Fax:  [phone]  Name: Kristen Hill MRN: [id number] Date of Birth: 19-Oct-1925

## 2018-06-01 ENCOUNTER — Ambulatory Visit: Payer: Medicare PPO | Attending: Orthopedic Surgery | Admitting: Physical Therapy

## 2018-06-01 ENCOUNTER — Encounter: Payer: Self-pay | Admitting: Physical Therapy

## 2018-06-01 DIAGNOSIS — R262 Difficulty in walking, not elsewhere classified: Secondary | ICD-10-CM | POA: Diagnosis not present

## 2018-06-01 DIAGNOSIS — R600 Localized edema: Secondary | ICD-10-CM | POA: Diagnosis not present

## 2018-06-01 DIAGNOSIS — M25561 Pain in right knee: Secondary | ICD-10-CM | POA: Diagnosis not present

## 2018-06-01 DIAGNOSIS — M25661 Stiffness of right knee, not elsewhere classified: Secondary | ICD-10-CM | POA: Diagnosis not present

## 2018-06-01 NOTE — Therapy (Signed)
[place], [place], [address] Phone: [phone]   Fax:  [phone]  Physical Therapy Treatment  Patient Details  Name: Kristen Hill MRN: [id number] Date of Birth: 03/11/1926 Referring Provider: Dorna Leitz   Encounter Date: 06/01/2018  PT End of Session - 06/01/18 0926    Visit Number  7    Date for PT Re-Evaluation  07/09/18    PT Start Time  0845    PT Stop Time  0940    PT Time Calculation (min)  55 min    Activity Tolerance  Patient tolerated treatment well    Behavior During Therapy  [facility] for tasks assessed/performed       Past Medical History:  Diagnosis Date  . Aortic atherosclerosis ([place])   . Cancer [facility])    right breast  . Diabetes mellitus without complication ([place]) 4481   type 2  . Glaucoma    left eye  . Hematuria 10/31/2013  . History of external beam radiation therapy 07/21/17- 08/11/17   Right Breast 2.66 Gy X 16 fractions  . Hypertension   . Jaundice    in 1947  . Jaundice 1946  . Measles as a child  . Multinodular goiter   . Mumps as a child  . OA (osteoarthritis)    right knee, torn meniscus lateral and medial  . Other and unspecified hyperlipidemia 07/31/2013  . Physical exam, annual 08/02/2013   Se[facility] Trinna Post at [facility] Dr Farris Has, Retinal specialist Dermatolgy  . Thyroid nodule   . Wears glasses   . Wears partial dentures     Past Surgical History:  Procedure Laterality Date  . BREAST LUMPECTOMY WITH RADIOACTIVE SEED LOCALIZATION Right 06/14/2017   Procedure: [place] BREAST LUMPECTOMY WITH RADIOACTIVE SEED LOCALIZATION ERAS PATHWAY;  Surgeon: Jovita Kussmaul, MD;  Location: [place];  Service: General;  Laterality: Right;  . COLONOSCOPY  09/2015  . EYE SURGERY  2009 and 2010   cataract both eyes  . EYE SURGERY  2012   laser to left eye, chalazion was removed with laser  . KNEE SURGERY Right    medial meniscus and lateral menicus  .  MULTIPLE TOOTH EXTRACTIONS    . PARTIAL KNEE ARTHROPLASTY Right 04/08/2018   Procedure: RIGHT UNICOMPARTMENTAL KNEE;  Surgeon: Dorna Leitz, MD;  [facility];  Service: Orthopedics;  Laterality: Right;  . TONSILLECTOMY  84 yrs old    There were no vitals filed for this visit.  Subjective Assessment - 06/01/18 0851    Subjective  Doing fine, still need to work on that balance    Currently in Pain?  No/denies                       [facility] Adult PT Treatment/Exercise - 06/01/18 0001      Ambulation/Gait   Stairs  Yes    Stairs Assistance  5: Supervision;4: Min guard    Stair Management Technique  One rail Right;Alternating pattern    Number of Stairs  12    Height of Stairs  6    Gait Comments  pt fatigues quick, slow descending reprots no pain just "hard", heave UE use on the way up due to fatigue      High Level Balance   High Level Balance Activities  Negotitating around obstacles   Some LOB min assist to correct      Knee/Hip Exercises: Aerobic   Elliptical  I 5 R 3 3 min    Recumbent Bike  6 min full rev- seated # 3      Knee/Hip Exercises: Machines for Strengthening   Cybex Knee Extension  10# 2 sets 10, RLE 5lb 2x10     Cybex Knee Flexion  15# RT only 10, 20# B 2 sets 10,     Cybex Leg Press  30# 3 sets 10, Heel raises 2x15, RLE TKE 5ln 2x5       Modalities   Modalities  Vasopneumatic      Vasopneumatic   Number Minutes Vasopneumatic   15 minutes    Vasopnuematic Location   Knee    Vasopneumatic Pressure  Medium    Vasopneumatic Temperature   40               PT Short Term Goals - 05/19/18 1109      PT SHORT TERM GOAL #1   Title  independent with intiial HEP    Baseline  doing HEP from HHPT plus bike    Status  Achieved        PT Long Term Goals - 05/26/18 1123      PT LONG TERM GOAL #1   Title  decrease pain  50%    Baseline  75%    Status  Achieved      PT LONG TERM GOAL #2   Title  understand RICE    Status  Achieved       PT LONG TERM GOAL #3   Title  decrease TUG test to 15 seconds    Status  Achieved      PT LONG TERM GOAL #4   Title  increase AROM of the right knee to 5-115 degrees flexion    Status  Partially Met      PT LONG TERM GOAL #5   Title  walk without cane for most distances    Status  Partially Met            Plan - 06/01/18 0933    Clinical Impression Statement  Pt continues progress towards goals, pt has some difficulty with stair negotiation, no reports of pain but stated "it's hard". Some LOB stepping over foam rolls requiring min assist to correct. Good strength on machine level interventions. Cues to extend RLE fully with SL on extensions.     Rehab Potential  Good    PT Frequency  3x / week    PT Duration  8 weeks    PT Treatment/Interventions  ADLs/Self Care Home Management;Electrical Stimulation;Moist Heat;Therapeutic exercise;Therapeutic activities;Functional mobility training;Patient/family education;Manual techniques;Gait training;Stair training;Balance training;Neuromuscular re-education;Scar mobilization;Vasopneumatic Device    PT Next Visit Plan  balance/strength/endurance, gait with and without cane, may be able to try stairs step over step       Patient will benefit from skilled therapeutic intervention in order to improve the following deficits and impairments:  Abnormal gait, Decreased range of motion, Difficulty walking, Decreased endurance, Decreased activity tolerance, Pain, Impaired flexibility, Decreased scar mobility, Decreased balance, Decreased mobility, Decreased strength, Increased edema  Visit Diagnosis: Acute pain of right knee  Stiffness of right knee, not elsewhere classified  Difficulty in walking, not elsewhere classified  Localized edema     Problem List Patient Active Problem List   Diagnosis Date Noted  . Primary osteoarthritis of right knee 04/08/2018  . Malignant neoplasm of upper-outer quadrant of right breast in female,  estrogen receptor positive ([place]) 06/29/2017  . Unspecified vitamin D deficiency 11/27/2013  .  Hematuria 10/31/2013  . Physical exam, annual 08/02/2013  . Other and unspecified hyperlipidemia 07/31/2013  . Diabetes mellitus without complication ([place])   . Hypertension   . Arthritis     Scot Jun, PTA 06/01/2018, 9:36 AM  [place], [place], [address] Phone: [phone]   Fax:  [phone]  Name: Kristen Hill MRN: [id number] Date of Birth: [birth year]

## 2018-06-07 ENCOUNTER — Ambulatory Visit: Payer: Medicare PPO | Admitting: Physical Therapy

## 2018-06-07 ENCOUNTER — Encounter: Payer: Self-pay | Admitting: Physical Therapy

## 2018-06-07 DIAGNOSIS — M25661 Stiffness of right knee, not elsewhere classified: Secondary | ICD-10-CM

## 2018-06-07 DIAGNOSIS — R600 Localized edema: Secondary | ICD-10-CM

## 2018-06-07 DIAGNOSIS — R262 Difficulty in walking, not elsewhere classified: Secondary | ICD-10-CM | POA: Diagnosis not present

## 2018-06-07 DIAGNOSIS — M25561 Pain in right knee: Secondary | ICD-10-CM | POA: Diagnosis not present

## 2018-06-07 NOTE — Therapy (Signed)
[place], [place], [address] Phone: [phone]   Fax:  [phone]  Physical Therapy Treatment  Patient Details  Name: Kristen Hill MRN: [id number] Date of Birth: 01-Oct-1925 Referring Provider: Dorna Leitz   Encounter Date: 06/07/2018  PT End of Session - 06/07/18 1052    Visit Number  8    Date for PT Re-Evaluation  07/09/18    PT Start Time  1008    PT Stop Time  1110    PT Time Calculation (min)  62 min       Past Medical History:  Diagnosis Date  . Aortic atherosclerosis ([place])   . Cancer [facility])    right breast  . Diabetes mellitus without complication ([place]) 3893   type 2  . Glaucoma    left eye  . Hematuria 10/31/2013  . History of external beam radiation therapy 07/21/17- 08/11/17   Right Breast 2.66 Gy X 16 fractions  . Hypertension   . Jaundice    in 1947  . Jaundice 1946  . Measles as a child  . Multinodular goiter   . Mumps as a child  . OA (osteoarthritis)    right knee, torn meniscus lateral and medial  . Other and unspecified hyperlipidemia 07/31/2013  . Physical exam, annual 08/02/2013   Sees Dr Trinna Post at [facility] Dr Farris Has, Retinal specialist Dermatolgy  . Thyroid nodule   . Wears glasses   . Wears partial dentures     Past Surgical History:  Procedure Laterality Date  . BREAST LUMPECTOMY WITH RADIOACTIVE SEED LOCALIZATION Right 06/14/2017   Procedure: [place] BREAST LUMPECTOMY WITH RADIOACTIVE SEED LOCALIZATION ERAS PATHWAY;  Surgeon: Jovita Kussmaul, MD;  Location: [place];  Service: General;  Laterality: Right;  . COLONOSCOPY  09/2015  . EYE SURGERY  2009 and 2010   cataract both eyes  . EYE SURGERY  2012   laser to left eye, chalazion was removed with laser  . KNEE SURGERY Right    medial meniscus and lateral menicus  . MULTIPLE TOOTH EXTRACTIONS    . PARTIAL KNEE ARTHROPLASTY Right 04/08/2018   Procedure: RIGHT UNICOMPARTMENTAL KNEE;   Surgeon: Dorna Leitz, MD;  [facility];  Service: Orthopedics;  Laterality: Right;  . TONSILLECTOMY  84 yrs old    There were no vitals filed for this visit.  Subjective Assessment - 06/07/18 1012    Subjective  amb in without AD, verb overall pretty good    Currently in Pain?  No/denies                       [facility] Adult PT Treatment/Exercise - 06/07/18 0001      High Level Balance   High Level Balance Comments  ball toss on airex , SL and DL      Exercises   Exercises  Lumbar;Knee/Hip      Knee/Hip Exercises: Aerobic   Elliptical  I 5 R 3 5 min    Nustep  L 5 6 min LE only      Knee/Hip Exercises: Machines for Strengthening   Cybex Knee Extension  10# 2 sets 10, RLE 5lb 10x    Cybex Knee Flexion  15# RT only 10, 20# B 2 sets 10,     Cybex Leg Press  30# 3 sets 10, Heel raises 2x15, RLE TKE 5ln 2x5       Knee/Hip Exercises: Standing   Functional Squat  15 reps    Walking with Sports Cord  30# 4 way x3 each      Modalities   Modalities  Vasopneumatic      Vasopneumatic   Number Minutes Vasopneumatic   15 minutes    Vasopnuematic Location   Knee    Vasopneumatic Pressure  Medium    Vasopneumatic Temperature   40               PT Short Term Goals - 05/19/18 1109      PT SHORT TERM GOAL #1   Title  independent with intiial HEP    Baseline  doing HEP from HHPT plus bike    Status  Achieved        PT Long Term Goals - 05/26/18 1123      PT LONG TERM GOAL #1   Title  decrease pain  50%    Baseline  75%    Status  Achieved      PT LONG TERM GOAL #2   Title  understand RICE    Status  Achieved      PT LONG TERM GOAL #3   Title  decrease TUG test to 15 seconds    Status  Achieved      PT LONG TERM GOAL #4   Title  increase AROM of the right knee to 5-115 degrees flexion    Status  Partially Met      PT LONG TERM GOAL #5   Title  walk without cane for most distances    Status  Partially Met            Plan - 06/07/18  1052    Clinical Impression Statement  pt still with hesitatation with initial steps from standing or with transitions. okayed pt to use elliptical at home. decreased balance on airex and with resisted side stepping.    PT Treatment/Interventions  ADLs/Self Care Home Management;Electrical Stimulation;Moist Heat;Therapeutic exercise;Therapeutic activities;Functional mobility training;Patient/family education;Manual techniques;Gait training;Stair training;Balance training;Neuromuscular re-education;Scar mobilization;Vasopneumatic Device    PT Next Visit Plan  check ROM and goals       Patient will benefit from skilled therapeutic intervention in order to improve the following deficits and impairments:  Abnormal gait, Decreased range of motion, Difficulty walking, Decreased endurance, Decreased activity tolerance, Pain, Impaired flexibility, Decreased scar mobility, Decreased balance, Decreased mobility, Decreased strength, Increased edema  Visit Diagnosis: Stiffness of right knee, not elsewhere classified  Difficulty in walking, not elsewhere classified  Localized edema     Problem List Patient Active Problem List   Diagnosis Date Noted  . Primary osteoarthritis of right knee 04/08/2018  . Malignant neoplasm of upper-outer quadrant of right breast in female, estrogen receptor positive ([place]) 06/29/2017  . Unspecified vitamin D deficiency 11/27/2013  . Hematuria 10/31/2013  . Physical exam, annual 08/02/2013  . Other and unspecified hyperlipidemia 07/31/2013  . Diabetes mellitus without complication ([place])   . Hypertension   . Arthritis     Heba Ige,[name] PTA 06/07/2018, 10:54 AM  [place], [place], [address] Phone: [phone]   Fax:  [phone]  Name: Kristen Hill MRN: [id number] Date of Birth: October 30, 1925

## 2018-06-09 ENCOUNTER — Ambulatory Visit: Payer: Medicare PPO | Admitting: Physical Therapy

## 2018-06-09 ENCOUNTER — Encounter: Payer: Self-pay | Admitting: Physical Therapy

## 2018-06-09 DIAGNOSIS — R262 Difficulty in walking, not elsewhere classified: Secondary | ICD-10-CM

## 2018-06-09 DIAGNOSIS — R600 Localized edema: Secondary | ICD-10-CM

## 2018-06-09 DIAGNOSIS — M25661 Stiffness of right knee, not elsewhere classified: Secondary | ICD-10-CM

## 2018-06-09 DIAGNOSIS — M25561 Pain in right knee: Secondary | ICD-10-CM | POA: Diagnosis not present

## 2018-06-09 NOTE — Therapy (Signed)
[place], [place], [address] Phone: [phone]   Fax:  [phone]  Physical Therapy Treatment  Patient Details  Name: Kristen Hill MRN: [id number] Date of Birth: September 08, 1926 Referring Provider: Dorna Leitz   Encounter Date: 06/09/2018  PT End of Session - 06/09/18 1151    Visit Number  9    Date for PT Re-Evaluation  07/09/18    PT Start Time  1056    PT Stop Time  1159    PT Time Calculation (min)  63 min    Activity Tolerance  Patient tolerated treatment well    Behavior During Therapy  [facility] for tasks assessed/performed       Past Medical History:  Diagnosis Date  . Aortic atherosclerosis ([place])   . Cancer [facility])    right breast  . Diabetes mellitus without complication ([place]) 4562   type 2  . Glaucoma    left eye  . Hematuria 10/31/2013  . History of external beam radiation therapy 07/21/17- 08/11/17   Right Breast 2.66 Gy X 16 fractions  . Hypertension   . Jaundice    in 1947  . Jaundice 1946  . Measles as a child  . Multinodular goiter   . Mumps as a child  . OA (osteoarthritis)    right knee, torn meniscus lateral and medial  . Other and unspecified hyperlipidemia 07/31/2013  . Physical exam, annual 08/02/2013   Sees Dr Trinna Post at [facility] Dr Farris Has, Retinal specialist Dermatolgy  . Thyroid nodule   . Wears glasses   . Wears partial dentures     Past Surgical History:  Procedure Laterality Date  . BREAST LUMPECTOMY WITH RADIOACTIVE SEED LOCALIZATION Right 06/14/2017   Procedure: [place] BREAST LUMPECTOMY WITH RADIOACTIVE SEED LOCALIZATION ERAS PATHWAY;  Surgeon: Jovita Kussmaul, MD;  Location: [place];  Service: General;  Laterality: Right;  . COLONOSCOPY  09/2015  . EYE SURGERY  2009 and 2010   cataract both eyes  . EYE SURGERY  2012   laser to left eye, chalazion was removed with laser  . KNEE SURGERY Right    medial meniscus and lateral menicus  .  MULTIPLE TOOTH EXTRACTIONS    . PARTIAL KNEE ARTHROPLASTY Right 04/08/2018   Procedure: RIGHT UNICOMPARTMENTAL KNEE;  Surgeon: Dorna Leitz, MD;  [facility];  Service: Orthopedics;  Laterality: Right;  . TONSILLECTOMY  84 yrs old    There were no vitals filed for this visit.  Subjective Assessment - 06/09/18 1103    Subjective  Patient moving slow today, reports "I have to get going"    Currently in Pain?  No/denies         [facility] PT Assessment - 06/09/18 0001      AROM   Right Knee Extension  5    Right Knee Flexion  114                   [facility] Adult PT Treatment/Exercise - 06/09/18 0001      Ambulation/Gait   Gait Comments  worked on fast walking without device, her first few steps are very slow and unsure., stairs with handrail step over step up and down.  Worked a lot on this as she is planning to go to the beach in the next two weeks      High Level Balance   High Level Balance Activities  Negotitating around obstacles;Negotiating over obstacles    High Level  Balance Comments  ball toss on airex , SL and DL, walking on exercise pad and double exercise pad, 4" step, and then stepping up onto the airex.        Knee/Hip Exercises: Aerobic   Recumbent Bike  6 min full rev- seated # 3    Nustep  L 5 6 min LE only      Knee/Hip Exercises: Seated   Sit to Sand  without UE support;2 sets;10 reps               PT Short Term Goals - 05/19/18 1109      PT SHORT TERM GOAL #1   Title  independent with intiial HEP    Baseline  doing HEP from HHPT plus bike    Status  Achieved        PT Long Term Goals - 06/09/18 1155      PT LONG TERM GOAL #5   Title  walk without cane for most distances    Status  Partially Met            Plan - 06/09/18 1152    Clinical Impression Statement  The first few steps there is a lot of hesitation especially with weight bearing on the right, she really hesitates with steps and uneven terrain.  She will be going  to the beach in the next few weeks.  She really struggles with standing on the airex, fear seems to be one of the biggest factors, she denies pain    PT Next Visit Plan  really work on her confidence especially with uneven terrain walking    Consulted and Agree with Plan of Care  Patient       Patient will benefit from skilled therapeutic intervention in order to improve the following deficits and impairments:  Abnormal gait, Decreased range of motion, Difficulty walking, Decreased endurance, Decreased activity tolerance, Pain, Impaired flexibility, Decreased scar mobility, Decreased balance, Decreased mobility, Decreased strength, Increased edema  Visit Diagnosis: Stiffness of right knee, not elsewhere classified  Difficulty in walking, not elsewhere classified  Localized edema  Acute pain of right knee     Problem List Patient Active Problem List   Diagnosis Date Noted  . Primary osteoarthritis of right knee 04/08/2018  . Malignant neoplasm of upper-outer quadrant of right breast in female, estrogen receptor positive ([place]) 06/29/2017  . Unspecified vitamin D deficiency 11/27/2013  . Hematuria 10/31/2013  . Physical exam, annual 08/02/2013  . Other and unspecified hyperlipidemia 07/31/2013  . Diabetes mellitus without complication ([place])   . Hypertension   . Arthritis     Sumner Boast., PT 06/09/2018, 11:56 AM  [place], [place], [address] Phone: [phone]   Fax:  [phone]  Name: Kristen Hill MRN: [id number] Date of Birth: [birth year]

## 2018-06-14 ENCOUNTER — Encounter: Payer: Self-pay | Admitting: Physical Therapy

## 2018-06-14 ENCOUNTER — Ambulatory Visit: Payer: Medicare PPO | Admitting: Physical Therapy

## 2018-06-14 DIAGNOSIS — R262 Difficulty in walking, not elsewhere classified: Secondary | ICD-10-CM | POA: Diagnosis not present

## 2018-06-14 DIAGNOSIS — R600 Localized edema: Secondary | ICD-10-CM

## 2018-06-14 DIAGNOSIS — M25661 Stiffness of right knee, not elsewhere classified: Secondary | ICD-10-CM | POA: Diagnosis not present

## 2018-06-14 DIAGNOSIS — M25561 Pain in right knee: Secondary | ICD-10-CM

## 2018-06-14 NOTE — Therapy (Addendum)
[place] [address], [place], [address] Phone: [phone]   Fax:  [phone] Progress Note Reporting Period 05/09/18 to 06/14/18 for the first 10 visits  See note below for Objective Data and Assessment of Progress/Goals.      Physical Therapy Treatment  Patient Details  Name: Kristen Hill MRN: [id number] Date of Birth: January 01, 1926 Referring Provider: Dorna Leitz   Encounter Date: 06/14/2018  PT End of Session - 06/14/18 1147    Visit Number  10    Date for PT Re-Evaluation  07/09/18    PT Start Time  1058    PT Stop Time  1158    PT Time Calculation (min)  60 min    Activity Tolerance  Patient tolerated treatment well    Behavior During Therapy  [facility] for tasks assessed/performed       Past Medical History:  Diagnosis Date  . Aortic atherosclerosis ([place])   . Cancer [facility])    right breast  . Diabetes mellitus without complication ([place]) 9381   type 2  . Glaucoma    left eye  . Hematuria 10/31/2013  . History of external beam radiation therapy 07/21/17- 08/11/17   Right Breast 2.66 Gy X 16 fractions  . Hypertension   . Jaundice    in 1947  . Jaundice 1946  . Measles as a child  . Multinodular goiter   . Mumps as a child  . OA (osteoarthritis)    right knee, torn meniscus lateral and medial  . Other and unspecified hyperlipidemia 07/31/2013  . Physical exam, annual 08/02/2013   Se[facility] Trinna [facility] at [facility] Dr Farris Has, Retinal specialist Dermatolgy  . Thyroid nodule   . Wears glasses   . Wears partial dentures     Past Surgical History:  Procedure Laterality Date  . BREAST LUMPECTOMY WITH RADIOACTIVE SEED LOCALIZATION Right 06/14/2017   Procedure: [place] BREAST LUMPECTOMY WITH RADIOACTIVE SEED LOCALIZATION ERAS PATHWAY;  Surgeon: Jovita Kussmaul, MD;  Location: [place];  Service: General;  Laterality: Right;  . COLONOSCOPY  09/2015  . EYE SURGERY  2009 and 2010   cataract  both eyes  . EYE SURGERY  2012   laser to left eye, chalazion was removed with laser  . KNEE SURGERY Right    medial meniscus and lateral menicus  . MULTIPLE TOOTH EXTRACTIONS    . PARTIAL KNEE ARTHROPLASTY Right 04/08/2018   Procedure: RIGHT UNICOMPARTMENTAL KNEE;  Surgeon: Dorna Leitz, MD;  [facility];  Service: Orthopedics;  Laterality: Right;  . TONSILLECTOMY  84 yrs old    There were no vitals filed for this visit.  Subjective Assessment - 06/14/18 1104    Subjective  Going on vacation on Saturday.  She has some fears on stairs ans the sand    Currently in Pain?  No/denies                       [facility] Adult PT Treatment/Exercise - 06/14/18 0001      Ambulation/Gait   Gait Comments  walked up and down stairs close supervision, some step over step going down but this was difficult, step over step going up, around building outside negotiated curbs and then around the parking island, she was very fatigued going up slope and very hesitant on any curbs      Knee/Hip Exercises: Aerobic   Recumbent Bike  6 min full rev- seated # 3  Nustep  L 5 6 min LE only      Knee/Hip Exercises: Machines for Strengthening   Cybex Knee Extension  10# 3sets 10    Cybex Knee Flexion  15# RT only 10, 20# B 2 sets 10,     Cybex Leg Press  30# 3 sets 10, Heel raises 2x15, RLE TKE 5ln 2x5       Knee/Hip Exercises: Seated   Sit to Sand  without UE support;2 sets;10 reps      Modalities   Modalities  Vasopneumatic      Vasopneumatic   Number Minutes Vasopneumatic   15 minutes    Vasopnuematic Location   Knee    Vasopneumatic Pressure  Medium    Vasopneumatic Temperature   38               PT Short Term Goals - 05/19/18 1109      PT SHORT TERM GOAL #1   Title  independent with intiial HEP    Baseline  doing HEP from HHPT plus bike    Status  Achieved        PT Long Term Goals - 06/09/18 1155      PT LONG TERM GOAL #5   Title  walk without cane for most  distances    Status  Partially Met            Plan - 06/14/18 1149    Clinical Impression Statement  Patient with fatigue going up slope outside, she can go up stairs step over step but struggles going down, she really hesitates on her first few steps and with any curbs    PT Next Visit Plan  really work on her confidence especially with uneven terrain walking, she is going to the beach next week    Consulted and Agree with Plan of Care  Patient       Patient will benefit from skilled therapeutic intervention in order to improve the following deficits and impairments:  Abnormal gait, Decreased range of motion, Difficulty walking, Decreased endurance, Decreased activity tolerance, Pain, Impaired flexibility, Decreased scar mobility, Decreased balance, Decreased mobility, Decreased strength, Increased edema  Visit Diagnosis: Stiffness of right knee, not elsewhere classified  Difficulty in walking, not elsewhere classified  Localized edema  Acute pain of right knee     Problem List Patient Active Problem List   Diagnosis Date Noted  . Primary osteoarthritis of right knee 04/08/2018  . Malignant neoplasm of upper-outer quadrant of right breast in female, estrogen receptor positive ([place]) 06/29/2017  . Unspecified vitamin D deficiency 11/27/2013  . Hematuria 10/31/2013  . Physical exam, annual 08/02/2013  . Other and unspecified hyperlipidemia 07/31/2013  . Diabetes mellitus without complication ([place])   . Hypertension   . Arthritis     Sumner Boast., PT 06/14/2018, 11:51 AM  [place], [place], [address] Phone: [phone]   Fax:  [phone]  Name: Kristen Hill MRN: [id number] Date of Birth: 10-13-1925

## 2018-06-16 ENCOUNTER — Encounter: Payer: Self-pay | Admitting: Physical Therapy

## 2018-06-16 ENCOUNTER — Ambulatory Visit: Payer: Medicare PPO | Admitting: Physical Therapy

## 2018-06-16 DIAGNOSIS — R600 Localized edema: Secondary | ICD-10-CM

## 2018-06-16 DIAGNOSIS — R262 Difficulty in walking, not elsewhere classified: Secondary | ICD-10-CM | POA: Diagnosis not present

## 2018-06-16 DIAGNOSIS — M25661 Stiffness of right knee, not elsewhere classified: Secondary | ICD-10-CM | POA: Diagnosis not present

## 2018-06-16 DIAGNOSIS — M25561 Pain in right knee: Secondary | ICD-10-CM | POA: Diagnosis not present

## 2018-06-16 DIAGNOSIS — H6123 Impacted cerumen, bilateral: Secondary | ICD-10-CM | POA: Diagnosis not present

## 2018-06-16 NOTE — Therapy (Signed)
[place], [place], [address] Phone: [phone]   Fax:  [phone]  Physical Therapy Treatment  Patient Details  Name: Kristen Hill MRN: [id number] Date of Birth: 1926/04/13 Referring Provider: Dorna Leitz   Encounter Date: 06/16/2018  PT End of Session - 06/16/18 1137    Visit Number  11    Date for PT Re-Evaluation  07/09/18    PT Start Time  1054    PT Stop Time  1149    PT Time Calculation (min)  55 min    Activity Tolerance  Patient tolerated treatment well    Behavior During Therapy  [facility] for tasks assessed/performed       Past Medical History:  Diagnosis Date  . Aortic atherosclerosis ([place])   . Cancer [facility])    right breast  . Diabetes mellitus without complication ([place]) 4196   type 2  . Glaucoma    left eye  . Hematuria 10/31/2013  . History of external beam radiation therapy 07/21/17- 08/11/17   Right Breast 2.66 Gy X 16 fractions  . Hypertension   . Jaundice    in 1947  . Jaundice 1946  . Measles as a child  . Multinodular goiter   . Mumps as a child  . OA (osteoarthritis)    right knee, torn meniscus lateral and medial  . Other and unspecified hyperlipidemia 07/31/2013  . Physical exam, annual 08/02/2013   Sees Dr Trinna [facility] at [facility] Dr Farris Has, Retinal specialist Dermatolgy  . Thyroid nodule   . Wears glasses   . Wears partial dentures     Past Surgical History:  Procedure Laterality Date  . BREAST LUMPECTOMY WITH RADIOACTIVE SEED LOCALIZATION Right 06/14/2017   Procedure: [place] BREAST LUMPECTOMY WITH RADIOACTIVE SEED LOCALIZATION ERAS PATHWAY;  Surgeon: Jovita Kussmaul, MD;  Location: [place];  Service: General;  Laterality: Right;  . COLONOSCOPY  09/2015  . EYE SURGERY  2009 and 2010   cataract both eyes  . EYE SURGERY  2012   laser to left eye, chalazion was removed with laser  . KNEE SURGERY Right    medial meniscus and lateral menicus  .  MULTIPLE TOOTH EXTRACTIONS    . PARTIAL KNEE ARTHROPLASTY Right 04/08/2018   Procedure: RIGHT UNICOMPARTMENTAL KNEE;  Surgeon: Dorna Leitz, MD;  [facility];  Service: Orthopedics;  Laterality: Right;  . TONSILLECTOMY  84 yrs old    There were no vitals filed for this visit.  Subjective Assessment - 06/16/18 1056    Subjective  Reports not feeling well today, "sinus issues"    Currently in Pain?  No/denies         [facility] PT Assessment - 06/16/18 0001      AROM   Right Knee Extension  4    Right Knee Flexion  115      Timed Up and Go Test   Normal TUG (seconds)  21                   [facility] Adult PT Treatment/Exercise - 06/16/18 0001      Ambulation/Gait   Gait Comments  Gait around the building, on slopes , curbs and going at a brisk pace.  She was very short of breath but did not stop, she again wants to hold my hand with any obstacles, or curbs due to fear      High Level Balance   High Level Balance Activities  Negotitating around obstacles;Negotiating over obstacles      Knee/Hip Exercises: Aerobic   Recumbent Bike  6 min full rev- seated # 3    Nustep  L 5 6 min LE only      Knee/Hip Exercises: Machines for Strengthening   Cybex Knee Extension  10# 3sets 10    Cybex Knee Flexion  10# 3x10 some cues for right LE to get to end range    Cybex Leg Press  30# 3 sets 10, Heel raises 2x15, RLE TKE 5ln 2x5       Modalities   Modalities  Vasopneumatic      Vasopneumatic   Number Minutes Vasopneumatic   15 minutes    Vasopnuematic Location   Knee    Vasopneumatic Pressure  Medium    Vasopneumatic Temperature   39               PT Short Term Goals - 05/19/18 1109      PT SHORT TERM GOAL #1   Title  independent with intiial HEP    Baseline  doing HEP from HHPT plus bike    Status  Achieved        PT Long Term Goals - 06/16/18 1144      PT LONG TERM GOAL #1   Title  decrease pain  50%    Status  Achieved      PT LONG TERM GOAL #2    Title  understand RICE    Status  Achieved      PT LONG TERM GOAL #3   Title  decrease TUG test to 15 seconds    Status  Partially Met      PT LONG TERM GOAL #4   Title  increase AROM of the right knee to 5-115 degrees flexion    Status  Achieved      PT LONG TERM GOAL #5   Status  Partially Met            Plan - 06/16/18 1139    Clinical Impression Statement  Patient not using any device, at times the first step is very small and shuffling, once she gets going she does well until she gets to an obstacle or curb, she really continues to hesitate on theis and on uneven terrain.  She typically will want to hold on to me to do this, in the clinic she does better but again dislikes the airex due to fear of the dynamic surface    PT Next Visit Plan  really work on her confidence especially with uneven terrain walking, she is going to the beach next week    Consulted and Agree with Plan of Care  Patient       Patient will benefit from skilled therapeutic intervention in order to improve the following deficits and impairments:  Abnormal gait, Decreased range of motion, Difficulty walking, Decreased endurance, Decreased activity tolerance, Pain, Impaired flexibility, Decreased scar mobility, Decreased balance, Decreased mobility, Decreased strength, Increased edema  Visit Diagnosis: Stiffness of right knee, not elsewhere classified  Difficulty in walking, not elsewhere classified  Localized edema  Acute pain of right knee     Problem List Patient Active Problem List   Diagnosis Date Noted  . Primary osteoarthritis of right knee 04/08/2018  . Malignant neoplasm of upper-outer quadrant of right breast in female, estrogen receptor positive ([place]) 06/29/2017  . Unspecified vitamin D deficiency 11/27/2013  . Hematuria 10/31/2013  . Physical exam, annual 08/02/2013  . Other  and unspecified hyperlipidemia 07/31/2013  . Diabetes mellitus without complication ([place])   . Hypertension    . Arthritis     Sumner Boast., PT 06/16/2018, 11:46 AM  [place], [place], [address] Phone: [phone]   Fax:  [phone]  Name: Kristen Hill MRN: [id number] Date of Birth: 01/06/1926

## 2018-06-28 ENCOUNTER — Ambulatory Visit: Payer: Medicare PPO | Attending: Orthopedic Surgery | Admitting: Physical Therapy

## 2018-06-28 DIAGNOSIS — M25561 Pain in right knee: Secondary | ICD-10-CM | POA: Insufficient documentation

## 2018-06-28 DIAGNOSIS — R600 Localized edema: Secondary | ICD-10-CM

## 2018-06-28 DIAGNOSIS — M5442 Lumbago with sciatica, left side: Secondary | ICD-10-CM | POA: Diagnosis not present

## 2018-06-28 DIAGNOSIS — R262 Difficulty in walking, not elsewhere classified: Secondary | ICD-10-CM | POA: Diagnosis not present

## 2018-06-28 DIAGNOSIS — M25661 Stiffness of right knee, not elsewhere classified: Secondary | ICD-10-CM | POA: Insufficient documentation

## 2018-06-28 NOTE — Therapy (Signed)
[place], [place], [address] Phone: [phone]   Fax:  [phone]  Physical Therapy Treatment  Patient Details  Name: Kristen Hill MRN: [id number] Date of Birth: 05-27-1926 Referring Provider (PT): Dorna Leitz   Encounter Date: 06/28/2018  PT End of Session - 06/28/18 0949    Visit Number  12    Date for PT Re-Evaluation  07/09/18    PT Start Time  0925    PT Stop Time  1020    PT Time Calculation (min)  55 min       Past Medical History:  Diagnosis Date  . Aortic atherosclerosis ([place])   . Cancer [facility])    right breast  . Diabetes mellitus without complication ([place]) 1638   type 2  . Glaucoma    left eye  . Hematuria 10/31/2013  . History of external beam radiation therapy 07/21/17- 08/11/17   Right Breast 2.66 Gy X 16 fractions  . Hypertension   . Jaundice    in 1947  . Jaundice 1946  . Measles as a child  . Multinodular goiter   . Mumps as a child  . OA (osteoarthritis)    right knee, torn meniscus lateral and medial  . Other and unspecified hyperlipidemia 07/31/2013  . Physical exam, annual 08/02/2013   Sees Dr Trinna Post at [facility] Dr Farris Has, Retinal specialist Dermatolgy  . Thyroid nodule   . Wears glasses   . Wears partial dentures     Past Surgical History:  Procedure Laterality Date  . BREAST LUMPECTOMY WITH RADIOACTIVE SEED LOCALIZATION Right 06/14/2017   Procedure: [place] BREAST LUMPECTOMY WITH RADIOACTIVE SEED LOCALIZATION ERAS PATHWAY;  Surgeon: Jovita Kussmaul, MD;  Location: [place];  Service: General;  Laterality: Right;  . COLONOSCOPY  09/2015  . EYE SURGERY  2009 and 2010   cataract both eyes  . EYE SURGERY  2012   laser to left eye, chalazion was removed with laser  . KNEE SURGERY Right    medial meniscus and lateral menicus  . MULTIPLE TOOTH EXTRACTIONS    . PARTIAL KNEE ARTHROPLASTY Right 04/08/2018   Procedure: RIGHT UNICOMPARTMENTAL  KNEE;  Surgeon: Dorna Leitz, MD;  [facility];  Service: Orthopedics;  Laterality: Right;  . TONSILLECTOMY  84 yrs old    There were no vitals filed for this visit.  Subjective Assessment - 06/28/18 0925    Subjective  I was at the beach last week and did okay, walking on beach was hard. I could tell I missed therapy- stiffness/tight    Currently in Pain?  Yes    Pain Score  2     Pain Location  Knee    Pain Orientation  Right                       [facility] Adult PT Treatment/Exercise - 06/28/18 0001      Knee/Hip Exercises: Aerobic   Recumbent Bike  6 min full rev- seated # 3    Nustep  L 4 7 min LE only      Knee/Hip Exercises: Machines for Strengthening   Cybex Knee Extension  10# 3sets 10    Cybex Knee Flexion  10# 3x10 RT LE only    Cybex Leg Press  30# 3 sets 10,       Modalities   Modalities  Vasopneumatic      Vasopneumatic   Number Minutes Vasopneumatic  15 minutes    Vasopnuematic Location   Knee    Vasopneumatic Pressure  Medium    Vasopneumatic Temperature   38      Manual Therapy   Manual Therapy  Joint mobilization;Passive ROM    Joint Mobilization  to loosen jt capsule and increase ROM    Passive ROM  flex/ext               PT Short Term Goals - 05/19/18 1109      PT SHORT TERM GOAL #1   Title  independent with intiial HEP    Baseline  doing HEP from HHPT plus bike    Status  Achieved        PT Long Term Goals - 06/16/18 1144      PT LONG TERM GOAL #1   Title  decrease pain  50%    Status  Achieved      PT LONG TERM GOAL #2   Title  understand RICE    Status  Achieved      PT LONG TERM GOAL #3   Title  decrease TUG test to 15 seconds    Status  Partially Met      PT LONG TERM GOAL #4   Title  increase AROM of the right knee to 5-115 degrees flexion    Status  Achieved      PT LONG TERM GOAL #5   Status  Partially Met            Plan - 06/28/18 0949    Clinical Impression Statement  increased  stiffness with ex after being gone a week. LOB noted when getting off machines and transitioning to walk, 1 time required assistance to prevent fall.    PT Treatment/Interventions  ADLs/Self Care Home Management;Electrical Stimulation;Moist Heat;Therapeutic exercise;Therapeutic activities;Functional mobility training;Patient/family education;Manual techniques;Gait training;Stair training;Balance training;Neuromuscular re-education;Scar mobilization;Vasopneumatic Device    PT Next Visit Plan  really work on her confidence especially with uneven terrain walking, check ROM and goals       Patient will benefit from skilled therapeutic intervention in order to improve the following deficits and impairments:  Abnormal gait, Decreased range of motion, Difficulty walking, Decreased endurance, Decreased activity tolerance, Pain, Impaired flexibility, Decreased scar mobility, Decreased balance, Decreased mobility, Decreased strength, Increased edema  Visit Diagnosis: Stiffness of right knee, not elsewhere classified  Difficulty in walking, not elsewhere classified  Localized edema     Problem List Patient Active Problem List   Diagnosis Date Noted  . Primary osteoarthritis of right knee 04/08/2018  . Malignant neoplasm of upper-outer quadrant of right breast in female, estrogen receptor positive ([place]) 06/29/2017  . Unspecified vitamin D deficiency 11/27/2013  . Hematuria 10/31/2013  . Physical exam, annual 08/02/2013  . Other and unspecified hyperlipidemia 07/31/2013  . Diabetes mellitus without complication ([place])   . Hypertension   . Arthritis     [name],[name] PTA 06/28/2018, 9:59 AM  [place], [place], [address] Phone: [phone]   Fax:  [phone]  Name: Kristen Hill MRN: [id number] Date of Birth: [birth year]

## 2018-06-30 ENCOUNTER — Ambulatory Visit: Payer: Medicare PPO | Admitting: Physical Therapy

## 2018-06-30 DIAGNOSIS — M25661 Stiffness of right knee, not elsewhere classified: Secondary | ICD-10-CM

## 2018-06-30 DIAGNOSIS — M5442 Lumbago with sciatica, left side: Secondary | ICD-10-CM | POA: Diagnosis not present

## 2018-06-30 DIAGNOSIS — M25561 Pain in right knee: Secondary | ICD-10-CM | POA: Diagnosis not present

## 2018-06-30 DIAGNOSIS — R600 Localized edema: Secondary | ICD-10-CM

## 2018-06-30 DIAGNOSIS — R262 Difficulty in walking, not elsewhere classified: Secondary | ICD-10-CM | POA: Diagnosis not present

## 2018-06-30 NOTE — Therapy (Signed)
[place], [place], [address] Phone: [phone]   Fax:  [phone]  Physical Therapy Treatment  Patient Details  Name: Kristen Hill MRN: [id number] Date of Birth: 10/10/1925 Referring Provider (PT): Kristen Hill   Encounter Date: 06/30/2018  PT End of Session - 06/30/18 1214    Visit Number  13    Date for PT Re-Evaluation  07/09/18    PT Start Time  1140    PT Stop Time  1230    PT Time Calculation (min)  50 min       Past Medical History:  Diagnosis Date  . Aortic atherosclerosis ([place])   . Cancer Kristen Hill ([facility]    right breast  . Diabetes mellitus without complication ([place]) 7494   type 2  . Glaucoma    left eye  . Hematuria 10/31/2013  . History of external beam radiation therapy 07/21/17- 08/11/17   Right Breast 2.66 Gy X 16 fractions  . Hypertension   . Jaundice    in 1947  . Jaundice 1946  . Measles as a child  . Multinodular goiter   . Mumps as a child  . OA (osteoarthritis)    right knee, torn meniscus lateral and medial  . Other and unspecified hyperlipidemia 07/31/2013  . Physical exam, annual 08/02/2013   Se[facility] Dr Kristen Hill [facility] Dr Kristen Hill, Retinal specialist Dermatolgy  . Thyroid nodule   . Wears glasses   . Wears partial dentures     Past Surgical History:  Procedure Laterality Date  . BREAST LUMPECTOMY WITH RADIOACTIVE SEED LOCALIZATION Right 06/14/2017   Procedure: [place] BREAST LUMPECTOMY WITH RADIOACTIVE SEED LOCALIZATION ERAS PATHWAY;  Surgeon: Kristen Kussmaul, MD;  Location: [facility];  Service: General;  Laterality: Right;  . COLONOSCOPY  09/2015  . EYE SURGERY  2009 and 2010   cataract both eyes  . EYE SURGERY  2012   laser to left eye, chalazion was removed with laser  . KNEE SURGERY Right    medial meniscus and lateral menicus  . MULTIPLE TOOTH EXTRACTIONS    . PARTIAL KNEE ARTHROPLASTY Right 04/08/2018   Procedure: RIGHT UNICOMPARTMENTAL  KNEE;  Surgeon: Kristen Leitz, MD;  [facility];  Service: Orthopedics;  Laterality: Right;  . TONSILLECTOMY  84 yrs old    There were no vitals filed for this visit.  Subjective Assessment - 06/30/18 1141    Subjective  yesterday was rough- [place] and Kristen think Kristen overdid    Currently in Pain?  No/denies         [facility] PT Assessment - 06/30/18 0001      AROM   Right Knee Extension  2    Right Knee Flexion  115      Strength   Overall Strength Comments  Rt quad 4+. Rt HS 5/5                   [facility] Adult PT Treatment/Exercise - 06/30/18 0001      High Level Balance   High Level Balance Activities  Negotitating around obstacles;Negotiating over obstacles   occassional Min A d/t LOB     Exercises   Exercises  Lumbar;Knee/Hip      Knee/Hip Exercises: Aerobic   Nustep  L 4 7 min LE only      Knee/Hip Exercises: Machines for Strengthening   Cybex Knee Extension  10# 3sets 10    Cybex Knee Flexion  15# 3x10      Knee/Hip Exercises: Standing   Lateral Step Up  Both;10 reps;Hand Hold: 1;Step Height: 6"   3# min A with several LOB   Forward Step Up  Both;Hand Hold: 1;Step Height: 6"   alt with 3#- very difficult for pt. min A with LOB   Walking with Sports Cord  30# 5 x fwd adn back, 3 times each side      Modalities   Modalities  Vasopneumatic      Vasopneumatic   Number Minutes Vasopneumatic   15 minutes    Vasopnuematic Location   Knee    Vasopneumatic Pressure  Medium    Vasopneumatic Temperature   36               PT Short Term Goals - 05/19/18 1109      PT SHORT TERM GOAL #1   Title  independent with intiial HEP    Baseline  doing HEP from HHPT plus bike    Status  Achieved        PT Long Term Goals - 06/30/18 1215      PT LONG TERM GOAL #1   Title  decrease pain  50%    Status  Achieved      PT LONG TERM GOAL #2   Title  understand RICE    Status  Achieved      PT LONG TERM GOAL #3   Title  decrease TUG test to 15  seconds    Baseline  17 sec without AD- slow to get started  ( hesitates with initial gait)    Status  Partially Met      PT LONG TERM GOAL #4   Title  increase AROM of the right knee to 5-115 degrees flexion    Status  Achieved      PT LONG TERM GOAL #5   Title  walk without cane for most distances    Status  Achieved            Plan - 06/30/18 1216    Clinical Impression Statement  all goals met except TUG. Knee ROm is excellent, slight quad weakness. Pt biggest limiting factor is balance and hesitation with initial gait. Several LOB requiring assistance to prevent fall with balance actvities today    PT Treatment/Interventions  ADLs/Self Care Home Management;Electrical Stimulation;Moist Heat;Therapeutic exercise;Therapeutic activities;Functional mobility training;Patient/family education;Manual techniques;Gait training;Stair training;Balance training;Neuromuscular re-education;Scar mobilization;Vasopneumatic Device    PT Next Visit Plan  really work on her confidence especially with uneven terrain walking, renewal vs D/C next week. If renewed will need balance goals       Patient will benefit from skilled therapeutic intervention in order to improve the following deficits and impairments:  Abnormal gait, Decreased range of motion, Difficulty walking, Decreased endurance, Decreased activity tolerance, Pain, Impaired flexibility, Decreased scar mobility, Decreased balance, Decreased mobility, Decreased strength, Increased edema  Visit Diagnosis: Stiffness of right knee, not elsewhere classified  Difficulty in walking, not elsewhere classified  Localized edema     Problem List Patient Active Problem List   Diagnosis Date Noted  . Primary osteoarthritis of right knee 04/08/2018  . Malignant neoplasm of upper-outer quadrant of right breast in female, estrogen receptor positive ([place]) 06/29/2017  . Unspecified vitamin D deficiency 11/27/2013  . Hematuria 10/31/2013  .  Physical exam, annual 08/02/2013  . Other and unspecified hyperlipidemia 07/31/2013  . Diabetes mellitus without complication ([place])   . Hypertension   . Arthritis  Kristen Hill,Kristen Hill PTA 06/30/2018, 12:18 PM  [place], [place], [address] Phone: [phone]   Fax:  [phone]  Name: Neesha Langton MRN: [id number] Date of Birth: 02-25-1926

## 2018-07-05 ENCOUNTER — Ambulatory Visit: Payer: Medicare PPO | Admitting: Physical Therapy

## 2018-07-05 ENCOUNTER — Other Ambulatory Visit: Payer: Self-pay | Admitting: General Surgery

## 2018-07-05 DIAGNOSIS — R262 Difficulty in walking, not elsewhere classified: Secondary | ICD-10-CM | POA: Diagnosis not present

## 2018-07-05 DIAGNOSIS — M25661 Stiffness of right knee, not elsewhere classified: Secondary | ICD-10-CM

## 2018-07-05 DIAGNOSIS — R600 Localized edema: Secondary | ICD-10-CM | POA: Diagnosis not present

## 2018-07-05 DIAGNOSIS — Z9889 Other specified postprocedural states: Secondary | ICD-10-CM

## 2018-07-05 DIAGNOSIS — M25561 Pain in right knee: Secondary | ICD-10-CM | POA: Diagnosis not present

## 2018-07-05 DIAGNOSIS — M5442 Lumbago with sciatica, left side: Secondary | ICD-10-CM | POA: Diagnosis not present

## 2018-07-05 NOTE — Therapy (Signed)
[place] [address] [place], [place], [address] Phone: [phone]   Fax:  [phone]  Physical Therapy Treatment  Patient Details  Name: Kristen Hill MRN: [id number] Date of Birth: [birth year] Referring Provider (PT): Dorna Leitz   Encounter Date: 07/05/2018  PT End of Session - 07/05/18 1216    Visit Number  14    Date for PT Re-Evaluation  07/09/18    PT Start Time  1139    PT Stop Time  1238    PT Time Calculation (min)  59 min       Past Medical History:  Diagnosis Date  . Aortic atherosclerosis ([place])   . Cancer [facility])    right breast  . Diabetes mellitus without complication ([place]) 7893   type 2  . Glaucoma    left eye  . Hematuria 10/31/2013  . History of external beam radiation therapy 07/21/17- 08/11/17   Right Breast 2.66 Gy X 16 fractions  . Hypertension   . Jaundice    in 1947  . Jaundice 1946  . Measles as a child  . Multinodular goiter   . Mumps as a child  . OA (osteoarthritis)    right knee, torn meniscus lateral and medial  . Other and unspecified hyperlipidemia 07/31/2013  . Physical exam, annual 08/02/2013   Sees Dr Trinna Post at [facility] Dr Farris Has, Retinal specialist Dermatolgy  . Thyroid nodule   . Wears glasses   . Wears partial dentures     Past Surgical History:  Procedure Laterality Date  . BREAST LUMPECTOMY WITH RADIOACTIVE SEED LOCALIZATION Right 06/14/2017   Procedure: [place] BREAST LUMPECTOMY WITH RADIOACTIVE SEED LOCALIZATION ERAS PATHWAY;  Surgeon: Jovita Kussmaul, MD;  Location: [facility];  Service: General;  Laterality: Right;  . COLONOSCOPY  09/2015  . EYE SURGERY  2009 and 2010   cataract both eyes  . EYE SURGERY  2012   laser to left eye, chalazion was removed with laser  . KNEE SURGERY Right    medial meniscus and lateral menicus  . MULTIPLE TOOTH EXTRACTIONS    . PARTIAL KNEE ARTHROPLASTY Right 04/08/2018   Procedure: RIGHT UNICOMPARTMENTAL  KNEE;  Surgeon: Dorna Leitz, MD;  [facility];  Service: Orthopedics;  Laterality: Right;  . TONSILLECTOMY  84 yrs old    There were no vitals filed for this visit.  Subjective Assessment - 07/05/18 1144    Subjective  stiffness in front of knee. alt gait on steps    Currently in Pain?  No/denies                       [facility] Adult PT Treatment/Exercise - 07/05/18 0001      Knee/Hip Exercises: Aerobic   Nustep  L 4 7 min LE only      Knee/Hip Exercises: Machines for Strengthening   Cybex Knee Extension  RT knee 3 sets 10    Cybex Knee Flexion  RT knee 10# 3 sets 10    Cybex Leg Press  30# 2 sets 15 BIL. RT 20# 10       Knee/Hip Exercises: Standing   Other Standing Knee Exercises  various stepping on and off Airex   min A with modaerate cuing for correct tech     Knee/Hip Exercises: Seated   Sit to Sand  15 reps;without UE support   on airex from blue chair- min A for balance     Vasopneumatic  Number Minutes Vasopneumatic   15 minutes    Vasopnuematic Location   Knee    Vasopneumatic Pressure  Medium    Vasopneumatic Temperature   36               PT Short Term Goals - 05/19/18 1109      PT SHORT TERM GOAL #1   Title  independent with intiial HEP    Baseline  doing HEP from HHPT plus bike    Status  Achieved        PT Long Term Goals - 06/30/18 1215      PT LONG TERM GOAL #1   Title  decrease pain  50%    Status  Achieved      PT LONG TERM GOAL #2   Title  understand RICE    Status  Achieved      PT LONG TERM GOAL #3   Title  decrease TUG test to 15 seconds    Baseline  17 sec without AD- slow to get started  ( hesitates with initial gait)    Status  Partially Met      PT LONG TERM GOAL #4   Title  increase AROM of the right knee to 5-115 degrees flexion    Status  Achieved      PT LONG TERM GOAL #5   Title  walk without cane for most distances    Status  Achieved            Plan - 07/05/18 1219    Clinical  Impression Statement  knee is doing very well- minimal complaint of tightness. balance is pts biggest limitation    PT Treatment/Interventions  ADLs/Self Care Home Management;Electrical Stimulation;Moist Heat;Therapeutic exercise;Therapeutic activities;Functional mobility training;Patient/family education;Manual techniques;Gait training;Stair training;Balance training;Neuromuscular re-education;Scar mobilization;Vasopneumatic Device    PT Next Visit Plan  assess goals and if decide to renewal will need balance goals       Patient will benefit from skilled therapeutic intervention in order to improve the following deficits and impairments:  Abnormal gait, Decreased range of motion, Difficulty walking, Decreased endurance, Decreased activity tolerance, Pain, Impaired flexibility, Decreased scar mobility, Decreased balance, Decreased mobility, Decreased strength, Increased edema  Visit Diagnosis: Stiffness of right knee, not elsewhere classified  Difficulty in walking, not elsewhere classified  Localized edema     Problem List Patient Active Problem List   Diagnosis Date Noted  . Primary osteoarthritis of right knee 04/08/2018  . Malignant neoplasm of upper-outer quadrant of right breast in female, estrogen receptor positive ([place]) 06/29/2017  . Unspecified vitamin D deficiency 11/27/2013  . Hematuria 10/31/2013  . Physical exam, annual 08/02/2013  . Other and unspecified hyperlipidemia 07/31/2013  . Diabetes mellitus without complication ([place])   . Hypertension   . Arthritis     Kristen Hill,[name]  PTA 07/05/2018, 12:22 PM  [place], [place], [address] Phone: [phone]   Fax:  [phone]  Name: Kristen Hill MRN: [id number] Date of Birth: [birth year]

## 2018-07-07 ENCOUNTER — Encounter: Payer: Medicare PPO | Admitting: Physical Therapy

## 2018-07-07 ENCOUNTER — Encounter: Payer: Self-pay | Admitting: Physical Therapy

## 2018-07-07 ENCOUNTER — Ambulatory Visit: Payer: Medicare PPO | Admitting: Physical Therapy

## 2018-07-07 DIAGNOSIS — M5442 Lumbago with sciatica, left side: Secondary | ICD-10-CM | POA: Diagnosis not present

## 2018-07-07 DIAGNOSIS — R600 Localized edema: Secondary | ICD-10-CM | POA: Diagnosis not present

## 2018-07-07 DIAGNOSIS — R262 Difficulty in walking, not elsewhere classified: Secondary | ICD-10-CM

## 2018-07-07 DIAGNOSIS — M25661 Stiffness of right knee, not elsewhere classified: Secondary | ICD-10-CM

## 2018-07-07 DIAGNOSIS — M25561 Pain in right knee: Secondary | ICD-10-CM | POA: Diagnosis not present

## 2018-07-07 NOTE — Therapy (Signed)
[place], [place], [address] Phone: [phone]   Fax:  [phone]  Physical Therapy Treatment  Patient Details  Name: Kristen Hill MRN: [id number] Date of Birth: Feb 20, 1926 Referring Provider (PT): Dorna Leitz   Encounter Date: 07/07/2018  PT End of Session - 07/07/18 1701    Visit Number  15    Date for PT Re-Evaluation  08/10/18    PT Start Time  1525    PT Stop Time  1622    PT Time Calculation (min)  57 min    Activity Tolerance  Patient tolerated treatment well    Behavior During Therapy  [facility] for tasks assessed/performed       Past Medical History:  Diagnosis Date  . Aortic atherosclerosis ([place])   . Cancer [facility])    right breast  . Diabetes mellitus without complication ([place]) 1601   type 2  . Glaucoma    left eye  . Hematuria 10/31/2013  . History of external beam radiation therapy 07/21/17- 08/11/17   Right Breast 2.66 Gy X 16 fractions  . Hypertension   . Jaundice    in 1947  . Jaundice 1946  . Measles as a child  . Multinodular goiter   . Mumps as a child  . OA (osteoarthritis)    right knee, torn meniscus lateral and medial  . Other and unspecified hyperlipidemia 07/31/2013  . Physical exam, annual 08/02/2013   Sees Dr Trinna Post at [facility] Dr Farris Has, Retinal specialist Dermatolgy  . Thyroid nodule   . Wears glasses   . Wears partial dentures     Past Surgical History:  Procedure Laterality Date  . BREAST LUMPECTOMY WITH RADIOACTIVE SEED LOCALIZATION Right 06/14/2017   Procedure: [place] BREAST LUMPECTOMY WITH RADIOACTIVE SEED LOCALIZATION ERAS PATHWAY;  Surgeon: Jovita Kussmaul, MD;  Location: [facility];  Service: General;  Laterality: Right;  . COLONOSCOPY  09/2015  . EYE SURGERY  2009 and 2010   cataract both eyes  . EYE SURGERY  2012   laser to left eye, chalazion was removed with laser  . KNEE SURGERY Right    medial meniscus and lateral menicus   . MULTIPLE TOOTH EXTRACTIONS    . PARTIAL KNEE ARTHROPLASTY Right 04/08/2018   Procedure: RIGHT UNICOMPARTMENTAL KNEE;  Surgeon: Dorna Leitz, MD;  [facility];  Service: Orthopedics;  Laterality: Right;  . TONSILLECTOMY  84 yrs old    There were no vitals filed for this visit.  Subjective Assessment - 07/07/18 1531    Subjective  Patient reports her biggest issue is balance.    Currently in Pain?  No/denies                       [facility] Adult PT Treatment/Exercise - 07/07/18 0001      Ambulation/Gait   Gait Comments  gait down and up stairs step over step with hand rail and SBA, then fast walking in the hall way then outside and negotiating curb up and down, she is very hesitant with curbs and changes in surface      High Level Balance   High Level Balance Activities  Negotitating around obstacles;Negotiating over obstacles;Side stepping;Backward walking    High Level Balance Comments  set up obstacle cours with dynamic surface, step overs and a 4" step, ball toss, ball kicks,      Knee/Hip Exercises: Aerobic   Recumbent Bike  6 min full rev-  seated # 3 level 0               PT Short Term Goals - 05/19/18 1109      PT SHORT TERM GOAL #1   Title  independent with intiial HEP    Baseline  doing HEP from HHPT plus bike    Status  Achieved        PT Long Term Goals - 07/07/18 1704      PT LONG TERM GOAL #1   Title  decrease pain  50%    Status  Achieved      PT LONG TERM GOAL #2   Title  understand RICE    Status  Achieved      PT LONG TERM GOAL #3   Title  decrease TUG test to 15 seconds    Status  Partially Met      PT LONG TERM GOAL #4   Title  increase AROM of the right knee to 5-115 degrees flexion    Status  Achieved      PT LONG TERM GOAL #5   Title  walk without cane for most distances    Status  Achieved      Additional Long Term Goals   Additional Long Term Goals  Yes      PT LONG TERM GOAL #6   Title  patient able to  walk in her community without difficulty and no hesitation on curbs    Time  6    Period  Weeks    Status  New            Plan - 07/07/18 1702    Clinical Impression Statement  Patient is doing great with the knee replacement, her biggest issue at this time is her gait and balance, it seems fear is holding her back, she really tenses up with undeven surfaces, with curbs, steps, he UE's go into some posturing with very active upper traps and arms in abduction.  Once she gets moving her gait smooths out and she has no issue.  With the soccer kicks she had a hard time with picking up feet due to fear    PT Frequency  2x / week    PT Duration  4 weeks    PT Treatment/Interventions  ADLs/Self Care Home Management;Electrical Stimulation;Moist Heat;Therapeutic exercise;Therapeutic activities;Functional mobility training;Patient/family education;Manual techniques;Gait training;Stair training;Balance training;Neuromuscular re-education;Scar mobilization;Vasopneumatic Device    PT Next Visit Plan  sent renewal, will work on balance    Consulted and Agree with Plan of Care  Patient       Patient will benefit from skilled therapeutic intervention in order to improve the following deficits and impairments:  Abnormal gait, Decreased range of motion, Difficulty walking, Decreased endurance, Decreased activity tolerance, Pain, Impaired flexibility, Decreased scar mobility, Decreased balance, Decreased mobility, Decreased strength, Increased edema  Visit Diagnosis: Stiffness of right knee, not elsewhere classified - Plan: PT plan of care cert/re-cert  Difficulty in walking, not elsewhere classified - Plan: PT plan of care cert/re-cert  Localized edema - Plan: PT plan of care cert/re-cert     Problem List Patient Active Problem List   Diagnosis Date Noted  . Primary osteoarthritis of right knee 04/08/2018  . Malignant neoplasm of upper-outer quadrant of right breast in female, estrogen receptor  positive ([place]) 06/29/2017  . Unspecified vitamin D deficiency 11/27/2013  . Hematuria 10/31/2013  . Physical exam, annual 08/02/2013  . Other and unspecified hyperlipidemia 07/31/2013  . Diabetes mellitus without  complication ([place])   . Hypertension   . Arthritis     Sumner Boast., PT 07/07/2018, 5:07 PM  [place], [place], [address] Phone: [phone]   Fax:  [phone]  Name: Shavanna Furnari MRN: [id number] Date of Birth: 05/18/1926

## 2018-07-11 ENCOUNTER — Ambulatory Visit
Admission: RE | Admit: 2018-07-11 | Discharge: 2018-07-11 | Disposition: A | Discharge status: Home / Self Care | Payer: Medicare PPO | Source: Ambulatory Visit | Attending: General Surgery | Admitting: General Surgery

## 2018-07-11 DIAGNOSIS — Z853 Personal history of malignant neoplasm of breast: Secondary | ICD-10-CM | POA: Diagnosis not present

## 2018-07-11 DIAGNOSIS — Z9889 Other specified postprocedural states: Secondary | ICD-10-CM

## 2018-07-11 DIAGNOSIS — R928 Other abnormal and inconclusive findings on diagnostic imaging of breast: Secondary | ICD-10-CM | POA: Diagnosis not present

## 2018-07-11 HISTORY — DX: Personal history of irradiation: Z92.3

## 2018-07-13 ENCOUNTER — Encounter: Payer: Self-pay | Admitting: Physical Therapy

## 2018-07-13 ENCOUNTER — Ambulatory Visit: Payer: Medicare PPO | Admitting: Physical Therapy

## 2018-07-13 DIAGNOSIS — M25661 Stiffness of right knee, not elsewhere classified: Secondary | ICD-10-CM

## 2018-07-13 DIAGNOSIS — R600 Localized edema: Secondary | ICD-10-CM | POA: Diagnosis not present

## 2018-07-13 DIAGNOSIS — R262 Difficulty in walking, not elsewhere classified: Secondary | ICD-10-CM | POA: Diagnosis not present

## 2018-07-13 DIAGNOSIS — M25561 Pain in right knee: Secondary | ICD-10-CM

## 2018-07-13 DIAGNOSIS — M5442 Lumbago with sciatica, left side: Secondary | ICD-10-CM | POA: Diagnosis not present

## 2018-07-13 NOTE — Therapy (Signed)
[place], [place], [address] Phone: [phone]   Fax:  [phone]  Physical Therapy Treatment  Patient Details  Name: Manaal Mandala MRN: [id number] Date of Birth: [birth year] Referring Provider (PT): Dorna Leitz   Encounter Date: 07/13/2018  PT End of Session - 07/13/18 0840    Visit Number  16    Date for PT Re-Evaluation  08/10/18    PT Start Time  0752    PT Stop Time  0851    PT Time Calculation (min)  59 min    Activity Tolerance  Patient tolerated treatment well    Behavior During Therapy  [facility] for tasks assessed/performed       Past Medical History:  Diagnosis Date  . Aortic atherosclerosis ([place])   . Cancer [facility])    right breast  . Diabetes mellitus without complication ([place]) 8250   type 2  . Glaucoma    left eye  . Hematuria 10/31/2013  . History of external beam radiation therapy 07/21/17- 08/11/17   Right Breast 2.66 Gy X 16 fractions  . Hypertension   . Jaundice    in 1947  . Jaundice 1946  . Measles as a child  . Multinodular goiter   . Mumps as a child  . OA (osteoarthritis)    right knee, torn meniscus lateral and medial  . Other and unspecified hyperlipidemia 07/31/2013  . Personal history of radiation therapy   . Physical exam, annual 08/02/2013   Sees Dr Trinna Post at [facility] Dr Farris Has, Retinal specialist Dermatolgy  . Thyroid nodule   . Wears glasses   . Wears partial dentures     Past Surgical History:  Procedure Laterality Date  . BREAST LUMPECTOMY Right 06/14/2017  . BREAST LUMPECTOMY WITH RADIOACTIVE SEED LOCALIZATION Right 06/14/2017   Procedure: [place] BREAST LUMPECTOMY WITH RADIOACTIVE SEED LOCALIZATION ERAS PATHWAY;  Surgeon: Jovita Kussmaul, MD;  Location: [place];  Service: General;  Laterality: Right;  . COLONOSCOPY  09/2015  . EYE SURGERY  2009 and 2010   cataract both eyes  . EYE SURGERY  2012   laser to left eye, chalazion was  removed with laser  . KNEE SURGERY Right    medial meniscus and lateral menicus  . MULTIPLE TOOTH EXTRACTIONS    . PARTIAL KNEE ARTHROPLASTY Right 04/08/2018   Procedure: RIGHT UNICOMPARTMENTAL KNEE;  Surgeon: Dorna Leitz, MD;  [facility];  Service: Orthopedics;  Laterality: Right;  . TONSILLECTOMY  84 yrs old    There were no vitals filed for this visit.  Subjective Assessment - 07/13/18 0755    Subjective  Patient reports very stiff this morning.  Worries about balance    Currently in Pain?  No/denies                       [facility] Adult PT Treatment/Exercise - 07/13/18 0001      High Level Balance   High Level Balance Activities  Side stepping;Backward walking;Direction changes;Turns;Tandem walking;Marching forwards;Negotiating over obstacles    High Level Balance Comments  ball toss, ball kicks, fast pace walking, 4" step up and overs      Knee/Hip Exercises: Aerobic   Recumbent Bike  6 min full rev- seated # 3 level 0      Knee/Hip Exercises: Machines for Strengthening   Cybex Knee Extension  10# 2x10    Cybex Knee Flexion  25# 2x10  Knee/Hip Exercises: Standing   Walking with Sports Cord  resisted gait all directions      Vasopneumatic   Number Minutes Vasopneumatic   15 minutes    Vasopnuematic Location   Knee    Vasopneumatic Pressure  Medium    Vasopneumatic Temperature   34               PT Short Term Goals - 05/19/18 1109      PT SHORT TERM GOAL #1   Title  independent with intiial HEP    Baseline  doing HEP from HHPT plus bike    Status  Achieved        PT Long Term Goals - 07/07/18 1704      PT LONG TERM GOAL #1   Title  decrease pain  50%    Status  Achieved      PT LONG TERM GOAL #2   Title  understand RICE    Status  Achieved      PT LONG TERM GOAL #3   Title  decrease TUG test to 15 seconds    Status  Partially Met      PT LONG TERM GOAL #4   Title  increase AROM of the right knee to 5-115 degrees flexion     Status  Achieved      PT LONG TERM GOAL #5   Title  walk without cane for most distances    Status  Achieved      Additional Long Term Goals   Additional Long Term Goals  Yes      PT LONG TERM GOAL #6   Title  patient able to walk in her community without difficulty and no hesitation on curbs    Time  6    Period  Weeks    Status  New            Plan - 07/13/18 0840    Clinical Impression Statement  Patient tends to really limp and stumble on her right leg when she first stands to walk, she was able to do better when I cued her to stand and weight shift prior to walking, she has the most difficulty when she first gets up and then with any tight spaces and on curbs, once she is moving she does very well.  Ball kicks scared her    PT Next Visit Plan  continue to work on balance and functional gait    Consulted and Agree with Plan of Care  Patient       Patient will benefit from skilled therapeutic intervention in order to improve the following deficits and impairments:  Abnormal gait, Decreased range of motion, Difficulty walking, Decreased endurance, Decreased activity tolerance, Pain, Impaired flexibility, Decreased scar mobility, Decreased balance, Decreased mobility, Decreased strength, Increased edema  Visit Diagnosis: Stiffness of right knee, not elsewhere classified  Difficulty in walking, not elsewhere classified  Localized edema  Acute pain of right knee     Problem List Patient Active Problem List   Diagnosis Date Noted  . Primary osteoarthritis of right knee 04/08/2018  . Malignant neoplasm of upper-outer quadrant of right breast in female, estrogen receptor positive ([place]) 06/29/2017  . Unspecified vitamin D deficiency 11/27/2013  . Hematuria 10/31/2013  . Physical exam, annual 08/02/2013  . Other and unspecified hyperlipidemia 07/31/2013  . Diabetes mellitus without complication ([place])   . Hypertension   . Arthritis     Sumner Boast.,  PT 07/13/2018, 8:45 AM  [facility]  [place], [place], [address] Phone: [phone]   Fax:  [phone]  Name: Jamie Hafford MRN: [id number] Date of Birth: [birth year]

## 2018-07-15 ENCOUNTER — Ambulatory Visit: Payer: Medicare PPO | Admitting: Physical Therapy

## 2018-07-15 DIAGNOSIS — M25661 Stiffness of right knee, not elsewhere classified: Secondary | ICD-10-CM | POA: Diagnosis not present

## 2018-07-15 DIAGNOSIS — R600 Localized edema: Secondary | ICD-10-CM

## 2018-07-15 DIAGNOSIS — M5442 Lumbago with sciatica, left side: Secondary | ICD-10-CM | POA: Diagnosis not present

## 2018-07-15 DIAGNOSIS — M25561 Pain in right knee: Secondary | ICD-10-CM | POA: Diagnosis not present

## 2018-07-15 DIAGNOSIS — R262 Difficulty in walking, not elsewhere classified: Secondary | ICD-10-CM

## 2018-07-15 NOTE — Therapy (Signed)
[place], [place], [address] Phone: [phone]   Fax:  [phone]  Physical Therapy Treatment  Patient Details  Name: Kristen Hill MRN: [id number] Date of Birth: 11-30-1925 Referring Provider (PT): Dorna Leitz   Encounter Date: 07/15/2018  PT End of Session - 07/15/18 1024    Visit Number  17    Date for PT Re-Evaluation  08/10/18    PT Start Time  8502    PT Stop Time  1108    PT Time Calculation (min)  53 min       Past Medical History:  Diagnosis Date  . Aortic atherosclerosis ([place])   . Cancer [facility])    right breast  . Diabetes mellitus without complication ([place]) 7741   type 2  . Glaucoma    left eye  . Hematuria 10/31/2013  . History of external beam radiation therapy 07/21/17- 08/11/17   Right Breast 2.66 Gy X 16 fractions  . Hypertension   . Jaundice    in 1947  . Jaundice 1946  . Measles as a child  . Multinodular goiter   . Mumps as a child  . OA (osteoarthritis)    right knee, torn meniscus lateral and medial  . Other and unspecified hyperlipidemia 07/31/2013  . Personal history of radiation therapy   . Physical exam, annual 08/02/2013   Sees Dr Trinna Post at [facility] Dr Farris Has, Retinal specialist Dermatolgy  . Thyroid nodule   . Wears glasses   . Wears partial dentures     Past Surgical History:  Procedure Laterality Date  . BREAST LUMPECTOMY Right 06/14/2017  . BREAST LUMPECTOMY WITH RADIOACTIVE SEED LOCALIZATION Right 06/14/2017   Procedure: [place] BREAST LUMPECTOMY WITH RADIOACTIVE SEED LOCALIZATION ERAS PATHWAY;  Surgeon: Jovita Kussmaul, MD;  Location: [place];  Service: General;  Laterality: Right;  . COLONOSCOPY  09/2015  . EYE SURGERY  2009 and 2010   cataract both eyes  . EYE SURGERY  2012   laser to left eye, chalazion was removed with laser  . KNEE SURGERY Right    medial meniscus and lateral menicus  . MULTIPLE TOOTH EXTRACTIONS    .  PARTIAL KNEE ARTHROPLASTY Right 04/08/2018   Procedure: RIGHT UNICOMPARTMENTAL KNEE;  Surgeon: Dorna Leitz, MD;  [facility];  Service: Orthopedics;  Laterality: Right;  . TONSILLECTOMY  84 yrs old    There were no vitals filed for this visit.  Subjective Assessment - 07/15/18 1009    Subjective  knee is good just still balance issues    Currently in Pain?  No/denies                       [facility] Adult PT Treatment/Exercise - 07/15/18 0001      High Level Balance   High Level Balance Activities  Negotitating around obstacles;Negotiating over obstacles    High Level Balance Comments  ball toss ( on and off airex), ball kicks      Knee/Hip Exercises: Aerobic   Nustep  L 5 7 min LE only      Knee/Hip Exercises: Machines for Strengthening   Cybex Knee Extension  10# 2x10    Cybex Knee Flexion  25# 2x10      Knee/Hip Exercises: Standing   Walking with Sports Cord  resisted gait all directions   lateral is very difficult for pt to control   Other Standing Knee Exercises  various stepping  on and off Airex/box      Vasopneumatic   Number Minutes Vasopneumatic   15 minutes    Vasopnuematic Location   Knee   pt still feels benefit d/t joint stiffness   Vasopneumatic Pressure  Medium    Vasopneumatic Temperature   34               PT Short Term Goals - 05/19/18 1109      PT SHORT TERM GOAL #1   Title  independent with intiial HEP    Baseline  doing HEP from HHPT plus bike    Status  Achieved        PT Long Term Goals - 07/15/18 1009      PT LONG TERM GOAL #6   Title  patient able to walk in her community without difficulty and no hesitation on curbs    Status  On-going            Plan - 07/15/18 1024    Clinical Impression Statement  pt still with difficulty with transition surface, dynamic surfaces and backwards walking- decreased stability and fearful. assistance needed    PT Treatment/Interventions  ADLs/Self Care Home  Management;Electrical Stimulation;Moist Heat;Therapeutic exercise;Therapeutic activities;Functional mobility training;Patient/family education;Manual techniques;Gait training;Stair training;Balance training;Neuromuscular re-education;Scar mobilization;Vasopneumatic Device    PT Next Visit Plan  continue to work on balance and functional gait       Patient will benefit from skilled therapeutic intervention in order to improve the following deficits and impairments:  Abnormal gait, Decreased range of motion, Difficulty walking, Decreased endurance, Decreased activity tolerance, Pain, Impaired flexibility, Decreased scar mobility, Decreased balance, Decreased mobility, Decreased strength, Increased edema  Visit Diagnosis: Stiffness of right knee, not elsewhere classified  Difficulty in walking, not elsewhere classified  Localized edema     Problem List Patient Active Problem List   Diagnosis Date Noted  . Primary osteoarthritis of right knee 04/08/2018  . Malignant neoplasm of upper-outer quadrant of right breast in female, estrogen receptor positive ([place]) 06/29/2017  . Unspecified vitamin D deficiency 11/27/2013  . Hematuria 10/31/2013  . Physical exam, annual 08/02/2013  . Other and unspecified hyperlipidemia 07/31/2013  . Diabetes mellitus without complication ([place])   . Hypertension   . Arthritis     Hillery Bhalla,[name] PTA 07/15/2018, 10:45 AM  [place], [place], [address] Phone: [phone]   Fax:  [phone]  Name: Kristen Hill MRN: [id number] Date of Birth: June 28, 1926

## 2018-07-19 ENCOUNTER — Encounter: Payer: Self-pay | Admitting: Physical Therapy

## 2018-07-19 ENCOUNTER — Ambulatory Visit: Payer: Medicare PPO | Admitting: Physical Therapy

## 2018-07-19 DIAGNOSIS — R600 Localized edema: Secondary | ICD-10-CM | POA: Diagnosis not present

## 2018-07-19 DIAGNOSIS — R262 Difficulty in walking, not elsewhere classified: Secondary | ICD-10-CM

## 2018-07-19 DIAGNOSIS — M5442 Lumbago with sciatica, left side: Secondary | ICD-10-CM | POA: Diagnosis not present

## 2018-07-19 DIAGNOSIS — M25561 Pain in right knee: Secondary | ICD-10-CM

## 2018-07-19 DIAGNOSIS — M25661 Stiffness of right knee, not elsewhere classified: Secondary | ICD-10-CM

## 2018-07-19 NOTE — Therapy (Signed)
[place] [address] [place], [place], [address] Phone: [phone]   Fax:  [phone]  Physical Therapy Treatment  Patient Details  Name: Kristen Hill MRN: [id number] Date of Birth: December 30, 1925 Referring Provider (PT): Dorna Leitz   Encounter Date: 07/19/2018  PT End of Session - 07/19/18 1601    Visit Number  18    Date for PT Re-Evaluation  08/10/18    PT Start Time  9211    PT Stop Time  1615    PT Time Calculation (min)  60 min    Activity Tolerance  Patient tolerated treatment well    Behavior During Therapy  [facility] for tasks assessed/performed       Past Medical History:  Diagnosis Date  . Aortic atherosclerosis ([place])   . Cancer [facility])    right breast  . Diabetes mellitus without complication ([place]) 9417   type 2  . Glaucoma    left eye  . Hematuria 10/31/2013  . History of external beam radiation therapy 07/21/17- 08/11/17   Right Breast 2.66 Gy X 16 fractions  . Hypertension   . Jaundice    in 1947  . Jaundice 1946  . Measles as a child  . Multinodular goiter   . Mumps as a child  . OA (osteoarthritis)    right knee, torn meniscus lateral and medial  . Other and unspecified hyperlipidemia 07/31/2013  . Personal history of radiation therapy   . Physical exam, annual 08/02/2013   Sees Dr Trinna Post at [facility] Dr Farris Has, Retinal specialist Dermatolgy  . Thyroid nodule   . Wears glasses   . Wears partial dentures     Past Surgical History:  Procedure Laterality Date  . BREAST LUMPECTOMY Right 06/14/2017  . BREAST LUMPECTOMY WITH RADIOACTIVE SEED LOCALIZATION Right 06/14/2017   Procedure: [place] BREAST LUMPECTOMY WITH RADIOACTIVE SEED LOCALIZATION ERAS PATHWAY;  Surgeon: Jovita Kussmaul, MD;  Location: [place];  Service: General;  Laterality: Right;  . COLONOSCOPY  09/2015  . EYE SURGERY  2009 and 2010   cataract both eyes  . EYE SURGERY  2012   laser to left eye, chalazion was  removed with laser  . KNEE SURGERY Right    medial meniscus and lateral menicus  . MULTIPLE TOOTH EXTRACTIONS    . PARTIAL KNEE ARTHROPLASTY Right 04/08/2018   Procedure: RIGHT UNICOMPARTMENTAL KNEE;  Surgeon: Dorna Leitz, MD;  [facility];  Service: Orthopedics;  Laterality: Right;  . TONSILLECTOMY  84 yrs old    There were no vitals filed for this visit.  Subjective Assessment - 07/19/18 1525    Subjective  I still just don't feel stable all the time    Currently in Pain?  No/denies                       [facility] Adult PT Treatment/Exercise - 07/19/18 0001      Ambulation/Gait   Gait Comments  gait fast walking, stairs step over step, negotiating curbs outside      High Level Balance   High Level Balance Activities  Side stepping;Backward walking;Negotitating around obstacles;Negotiating over obstacles    High Level Balance Comments  ball toss ( on and off airex), ball kicks      Knee/Hip Exercises: Aerobic   Nustep  L 5 7 min LE only      Knee/Hip Exercises: Standing   Other Standing Knee Exercises  various stepping on and  off Airex/box, over and back side to side and front to back      Vasopneumatic   Number Minutes Vasopneumatic   15 minutes    Vasopnuematic Location   Knee    Vasopneumatic Pressure  Medium    Vasopneumatic Temperature   37               PT Short Term Goals - 05/19/18 1109      PT SHORT TERM GOAL #1   Title  independent with intiial HEP    Baseline  doing HEP from HHPT plus bike    Status  Achieved        PT Long Term Goals - 07/15/18 1009      PT LONG TERM GOAL #6   Title  patient able to walk in her community without difficulty and no hesitation on curbs    Status  On-going            Plan - 07/19/18 1602    Clinical Impression Statement  Pateint better on the curbs, less hesitation, she still tends to really shuffle when she has to negotiate around things, and the first attempt is always a little more  unstable, then she seems to do much better    PT Next Visit Plan  balance and confidence    Consulted and Agree with Plan of Care  Patient       Patient will benefit from skilled therapeutic intervention in order to improve the following deficits and impairments:  Abnormal gait, Decreased range of motion, Difficulty walking, Decreased endurance, Decreased activity tolerance, Pain, Impaired flexibility, Decreased scar mobility, Decreased balance, Decreased mobility, Decreased strength, Increased edema  Visit Diagnosis: Stiffness of right knee, not elsewhere classified  Difficulty in walking, not elsewhere classified  Localized edema  Acute pain of right knee     Problem List Patient Active Problem List   Diagnosis Date Noted  . Primary osteoarthritis of right knee 04/08/2018  . Malignant neoplasm of upper-outer quadrant of right breast in female, estrogen receptor positive ([place]) 06/29/2017  . Unspecified vitamin D deficiency 11/27/2013  . Hematuria 10/31/2013  . Physical exam, annual 08/02/2013  . Other and unspecified hyperlipidemia 07/31/2013  . Diabetes mellitus without complication ([place])   . Hypertension   . Arthritis     Sumner Boast., PT 07/19/2018, 4:04 PM  [place], [place], [address] Phone: [phone]   Fax:  [phone]  Name: Kristen Hill MRN: [id number] Date of Birth: [birth year]

## 2018-07-21 ENCOUNTER — Ambulatory Visit: Payer: Medicare PPO | Admitting: Physical Therapy

## 2018-07-21 DIAGNOSIS — M25561 Pain in right knee: Secondary | ICD-10-CM | POA: Diagnosis not present

## 2018-07-21 DIAGNOSIS — M25661 Stiffness of right knee, not elsewhere classified: Secondary | ICD-10-CM

## 2018-07-21 DIAGNOSIS — R262 Difficulty in walking, not elsewhere classified: Secondary | ICD-10-CM

## 2018-07-21 DIAGNOSIS — M5442 Lumbago with sciatica, left side: Secondary | ICD-10-CM | POA: Diagnosis not present

## 2018-07-21 DIAGNOSIS — R600 Localized edema: Secondary | ICD-10-CM

## 2018-07-21 NOTE — Therapy (Signed)
[place] [address] [place], [place], [address] Phone: [phone]   Fax:  [phone]  Physical Therapy Treatment  Patient Details  Name: Kristen Hill MRN: [id number] Date of Birth: [birth year] Referring Provider (PT): Dorna Leitz   Encounter Date: 07/21/2018  PT End of Session - 07/21/18 1437    Visit Number  19    Date for PT Re-Evaluation  08/10/18    PT Start Time  1400    PT Stop Time  1500    PT Time Calculation (min)  60 min       Past Medical History:  Diagnosis Date  . Aortic atherosclerosis ([place])   . Cancer [facility])    right breast  . Diabetes mellitus without complication ([place]) 0092   type 2  . Glaucoma    left eye  . Hematuria 10/31/2013  . History of external beam radiation therapy 07/21/17- 08/11/17   Right Breast 2.66 Gy X 16 fractions  . Hypertension   . Jaundice    in 1947  . Jaundice 1946  . Measles as a child  . Multinodular goiter   . Mumps as a child  . OA (osteoarthritis)    right knee, torn meniscus lateral and medial  . Other and unspecified hyperlipidemia 07/31/2013  . Personal history of radiation therapy   . Physical exam, annual 08/02/2013   Se[facility] Dr Trinna [facility] at [facility] Dr Farris Has, Retinal specialist Dermatolgy  . Thyroid nodule   . Wears glasses   . Wears partial dentures     Past Surgical History:  Procedure Laterality Date  . BREAST LUMPECTOMY Right 06/14/2017  . BREAST LUMPECTOMY WITH RADIOACTIVE SEED LOCALIZATION Right 06/14/2017   Procedure: [place] BREAST LUMPECTOMY WITH RADIOACTIVE SEED LOCALIZATION ERAS PATHWAY;  Surgeon: Jovita Kussmaul, MD;  Location: [place];  Service: General;  Laterality: Right;  . COLONOSCOPY  09/2015  . EYE SURGERY  2009 and 2010   cataract both eyes  . EYE SURGERY  2012   laser to left eye, chalazion was removed with laser  . KNEE SURGERY Right    medial meniscus and lateral menicus  . MULTIPLE TOOTH EXTRACTIONS    .  PARTIAL KNEE ARTHROPLASTY Right 04/08/2018   Procedure: RIGHT UNICOMPARTMENTAL KNEE;  Surgeon: Dorna Leitz, MD;  [facility];  Service: Orthopedics;  Laterality: Right;  . TONSILLECTOMY  84 yrs old    There were no vitals filed for this visit.  Subjective Assessment - 07/21/18 1354    Subjective  stiff after sitting and still trouble when I first get up and get moving    Currently in Pain?  No/denies                       [facility] Adult PT Treatment/Exercise - 07/21/18 0001      High Level Balance   High Level Balance Activities  Side stepping;Backward walking;Direction changes;Tandem walking;Marching forwards;Marching backwards;Negotitating around obstacles;Negotiating over obstacles    High Level Balance Comments  ball toss and kicking, toss with gait   compliant and dynamic surface     Knee/Hip Exercises: Aerobic   Nustep  L 5 7 min LE only      Vasopneumatic   Number Minutes Vasopneumatic   15 minutes    Vasopnuematic Location   Knee    Vasopneumatic Pressure  Medium    Vasopneumatic Temperature   37  PT Short Term Goals - 05/19/18 1109      PT SHORT TERM GOAL #1   Title  independent with intiial HEP    Baseline  doing HEP from HHPT plus bike    Status  Achieved        PT Long Term Goals - 07/15/18 1009      PT LONG TERM GOAL #6   Title  patient able to walk in her community without difficulty and no hesitation on curbs    Status  On-going            Plan - 07/21/18 1437    Clinical Impression Statement  pt still with difficulty with higher level balance actvities esp on dynamic surfaces, several LOB requiring assistance. knee is doing well just stiff    PT Treatment/Interventions  ADLs/Self Care Home Management;Electrical Stimulation;Moist Heat;Therapeutic exercise;Therapeutic activities;Functional mobility training;Patient/family education;Manual techniques;Gait training;Stair training;Balance training;Neuromuscular  re-education;Scar mobilization;Vasopneumatic Device    PT Next Visit Plan  balance and confidence       Patient will benefit from skilled therapeutic intervention in order to improve the following deficits and impairments:  Abnormal gait, Decreased range of motion, Difficulty walking, Decreased endurance, Decreased activity tolerance, Pain, Impaired flexibility, Decreased scar mobility, Decreased balance, Decreased mobility, Decreased strength, Increased edema  Visit Diagnosis: Stiffness of right knee, not elsewhere classified  Difficulty in walking, not elsewhere classified  Localized edema     Problem List Patient Active Problem List   Diagnosis Date Noted  . Primary osteoarthritis of right knee 04/08/2018  . Malignant neoplasm of upper-outer quadrant of right breast in female, estrogen receptor positive ([place]) 06/29/2017  . Unspecified vitamin D deficiency 11/27/2013  . Hematuria 10/31/2013  . Physical exam, annual 08/02/2013  . Other and unspecified hyperlipidemia 07/31/2013  . Diabetes mellitus without complication ([place])   . Hypertension   . Arthritis     [name],[name]  PTA 07/21/2018, 2:38 PM  [place], [place], [address] Phone: [phone]   Fax:  [phone]  Name: Sonal Dorwart MRN: [id number] Date of Birth: [birth year]

## 2018-07-26 ENCOUNTER — Ambulatory Visit: Payer: Medicare PPO | Admitting: Physical Therapy

## 2018-07-26 ENCOUNTER — Encounter: Payer: Self-pay | Admitting: Physical Therapy

## 2018-07-26 DIAGNOSIS — M5442 Lumbago with sciatica, left side: Secondary | ICD-10-CM | POA: Diagnosis not present

## 2018-07-26 DIAGNOSIS — R600 Localized edema: Secondary | ICD-10-CM

## 2018-07-26 DIAGNOSIS — M25661 Stiffness of right knee, not elsewhere classified: Secondary | ICD-10-CM | POA: Diagnosis not present

## 2018-07-26 DIAGNOSIS — M25561 Pain in right knee: Secondary | ICD-10-CM

## 2018-07-26 DIAGNOSIS — R262 Difficulty in walking, not elsewhere classified: Secondary | ICD-10-CM | POA: Diagnosis not present

## 2018-07-26 NOTE — Therapy (Signed)
[address] [place], [place], [address] Phone: [phone]   Fax:  [phone] Progress Note Reporting Period 06/16/18 to 07/26/18 for the visits 11-20  See note below for Objective Data and Assessment of Progress/Goals.      Physical Therapy Treatment  Patient Details  Name: Kristen Hill MRN: [id number] Date of Birth: [birth year] Referring Provider (PT): Dorna Leitz   Encounter Date: 07/26/2018  PT End of Session - 07/26/18 1137    Visit Number  20    Date for PT Re-Evaluation  08/10/18    PT Start Time  1053    PT Stop Time  1148    PT Time Calculation (min)  55 min    Activity Tolerance  Patient tolerated treatment well    Behavior During Therapy  [facility] for tasks assessed/performed       Past Medical History:  Diagnosis Date  . Aortic atherosclerosis ([place])   . Cancer [facility])    right breast  . Diabetes mellitus without complication ([place]) 1749   type 2  . Glaucoma    left eye  . Hematuria 10/31/2013  . History of external beam radiation therapy 07/21/17- 08/11/17   Right Breast 2.66 Gy X 16 fractions  . Hypertension   . Jaundice    in 1947  . Jaundice 1946  . Measles as a child  . Multinodular goiter   . Mumps as a child  . OA (osteoarthritis)    right knee, torn meniscus lateral and medial  . Other and unspecified hyperlipidemia 07/31/2013  . Personal history of radiation therapy   . Physical exam, annual 08/02/2013   Sees Dr Trinna Post at [facility] Dr Farris Has, Retinal specialist Dermatolgy  . Thyroid nodule   . Wears glasses   . Wears partial dentures     Past Surgical History:  Procedure Laterality Date  . BREAST LUMPECTOMY Right 06/14/2017  . BREAST LUMPECTOMY WITH RADIOACTIVE SEED LOCALIZATION Right 06/14/2017   Procedure: [place] BREAST LUMPECTOMY WITH RADIOACTIVE SEED LOCALIZATION ERAS PATHWAY;  Surgeon: Jovita Kussmaul, MD;  Location: [place];  Service: General;   Laterality: Right;  . COLONOSCOPY  09/2015  . EYE SURGERY  2009 and 2010   cataract both eyes  . EYE SURGERY  2012   laser to left eye, chalazion was removed with laser  . KNEE SURGERY Right    medial meniscus and lateral menicus  . MULTIPLE TOOTH EXTRACTIONS    . PARTIAL KNEE ARTHROPLASTY Right 04/08/2018   Procedure: RIGHT UNICOMPARTMENTAL KNEE;  Surgeon: Dorna Leitz, MD;  [facility];  Service: Orthopedics;  Laterality: Right;  . TONSILLECTOMY  84 yrs old    There were no vitals filed for this visit.  Subjective Assessment - 07/26/18 1053    Subjective  Patient reports that she is doing well, just still stiff and has difficulty taking the first few steps and also is unsteady at times.  Reprots her biggest issue is steps and curbs    Currently in Pain?  Yes    Pain Score  1     Pain Location  Knee    Pain Orientation  Right    Pain Descriptors / Indicators  Tightness    Aggravating Factors   after a lot of rest    Pain Relieving Factors  moving         [place] PT Assessment - 07/26/18 0001      Palpation   Palpation comment  she is very tender inthe right medial knee, not warm                   [facility] Adult PT Treatment/Exercise - 07/26/18 0001      Ambulation/Gait   Gait Comments  gait outside negotiating curbs, numerous times and keeping good pace, she really has difficulty with maintianing speed on transition to curbs, she has to stop, hesitate and really stutter step before going up      High Level Balance   High Level Balance Activities  Side stepping;Backward walking;Direction changes;Figure 8 turns;Negotitating around obstacles;Negotiating over obstacles    High Level Balance Comments  ball toss and kicking, toss with gait      Knee/Hip Exercises: Aerobic   Nustep  L 5 7 min LE only      Vasopneumatic   Number Minutes Vasopneumatic   15 minutes    Vasopnuematic Location   Knee    Vasopneumatic Pressure  Medium    Vasopneumatic Temperature   35                PT Short Term Goals - 05/19/18 1109      PT SHORT TERM GOAL #1   Title  independent with intiial HEP    Baseline  doing HEP from HHPT plus bike    Status  Achieved        PT Long Term Goals - 07/15/18 1009      PT LONG TERM GOAL #6   Title  patient able to walk in her community without difficulty and no hesitation on curbs    Status  On-going            Plan - 07/26/18 1138    Clinical Impression Statement  Patient does very well once she is going her issues are getting started and when she has to negotiate a curb or an obstace, she tends to slow down, really stutter step and then will take the step but is hesitant and is unsafe at times , she goes up with the right leg first and really seems to struggle with this    PT Next Visit Plan  balance and confidence    Consulted and Agree with Plan of Care  Patient       Patient will benefit from skilled therapeutic intervention in order to improve the following deficits and impairments:  Abnormal gait, Decreased range of motion, Difficulty walking, Decreased endurance, Decreased activity tolerance, Pain, Impaired flexibility, Decreased scar mobility, Decreased balance, Decreased mobility, Decreased strength, Increased edema  Visit Diagnosis: Stiffness of right knee, not elsewhere classified  Difficulty in walking, not elsewhere classified  Localized edema  Acute pain of right knee     Problem List Patient Active Problem List   Diagnosis Date Noted  . Primary osteoarthritis of right knee 04/08/2018  . Malignant neoplasm of upper-outer quadrant of right breast in female, estrogen receptor positive ([place]) 06/29/2017  . Unspecified vitamin D deficiency 11/27/2013  . Hematuria 10/31/2013  . Physical exam, annual 08/02/2013  . Other and unspecified hyperlipidemia 07/31/2013  . Diabetes mellitus without complication ([place])   . Hypertension   . Arthritis     Kristen Hill., PT 07/26/2018, 12:26  PM  [place] [address] [place], [place], [address] Phone: [phone]   Fax:  [phone]  Name: Kristen Hill MRN: [id number] Date of Birth: [birth year]

## 2018-07-28 ENCOUNTER — Encounter: Payer: Medicare PPO | Admitting: Physical Therapy

## 2018-07-28 ENCOUNTER — Ambulatory Visit: Payer: Medicare PPO | Admitting: Physical Therapy

## 2018-07-28 DIAGNOSIS — R262 Difficulty in walking, not elsewhere classified: Secondary | ICD-10-CM

## 2018-07-28 DIAGNOSIS — M25561 Pain in right knee: Secondary | ICD-10-CM | POA: Diagnosis not present

## 2018-07-28 DIAGNOSIS — M5442 Lumbago with sciatica, left side: Secondary | ICD-10-CM | POA: Diagnosis not present

## 2018-07-28 DIAGNOSIS — R600 Localized edema: Secondary | ICD-10-CM

## 2018-07-28 DIAGNOSIS — M25661 Stiffness of right knee, not elsewhere classified: Secondary | ICD-10-CM | POA: Diagnosis not present

## 2018-07-28 NOTE — Therapy (Signed)
[address] [place], [place], [address] Phone: [phone]   Fax:  [phone]  Physical Therapy Treatment  Patient Details  Name: Kristen Hill MRN: [id number] Date of Birth: 1926-01-17 Referring Provider (PT): Dorna Leitz   Encounter Date: 07/28/2018  PT End of Session - 07/28/18 1611    Visit Number  21    Date for PT Re-Evaluation  08/10/18    PT Start Time  1528    PT Stop Time  1621    PT Time Calculation (min)  53 min    Activity Tolerance  Patient tolerated treatment well    Behavior During Therapy  [facility] for tasks assessed/performed       Past Medical History:  Diagnosis Date  . Aortic atherosclerosis ([place])   . Cancer [facility])    right breast  . Diabetes mellitus without complication ([place]) 6962   type 2  . Glaucoma    left eye  . Hematuria 10/31/2013  . History of external beam radiation therapy 07/21/17- 08/11/17   Right Breast 2.66 Gy X 16 fractions  . Hypertension   . Jaundice    in 1947  . Jaundice 1946  . Measles as a child  . Multinodular goiter   . Mumps as a child  . OA (osteoarthritis)    right knee, torn meniscus lateral and medial  . Other and unspecified hyperlipidemia 07/31/2013  . Personal history of radiation therapy   . Physical exam, annual 08/02/2013   Sees Dr Trinna Post at [facility] Dr Farris Has, Retinal specialist Dermatolgy  . Thyroid nodule   . Wears glasses   . Wears partial dentures     Past Surgical History:  Procedure Laterality Date  . BREAST LUMPECTOMY Right 06/14/2017  . BREAST LUMPECTOMY WITH RADIOACTIVE SEED LOCALIZATION Right 06/14/2017   Procedure: [place] BREAST LUMPECTOMY WITH RADIOACTIVE SEED LOCALIZATION ERAS PATHWAY;  Surgeon: Jovita Kussmaul, MD;  Location: [place];  Service: General;  Laterality: Right;  . COLONOSCOPY  09/2015  . EYE SURGERY  2009 and 2010   cataract both eyes  . EYE SURGERY  2012   laser to left eye, chalazion was  removed with laser  . KNEE SURGERY Right    medial meniscus and lateral menicus  . MULTIPLE TOOTH EXTRACTIONS    . PARTIAL KNEE ARTHROPLASTY Right 04/08/2018   Procedure: RIGHT UNICOMPARTMENTAL KNEE;  Surgeon: Dorna Leitz, MD;  [facility];  Service: Orthopedics;  Laterality: Right;  . TONSILLECTOMY  84 yrs old    There were no vitals filed for this visit.  Subjective Assessment - 07/28/18 1527    Subjective  "Feeling pretty good, havnt had any problems"    Currently in Pain?  No/denies                       [facility] Adult PT Treatment/Exercise - 07/28/18 0001      High Level Balance   High Level Balance Activities  Marching forwards;Marching backwards;Tandem walking;Braiding;Backward walking   Forward and backwards walking while catching ball.   High Level Balance Comments  Stepping up/over multiple steps in a row, step up on airex pad, airex standing balance 20 sec, standing balance with kicking ball,       Exercises   Exercises  Knee/Hip      Knee/Hip Exercises: Aerobic   Recumbent Bike  7 min.      Knee/Hip Exercises: Standing   Lateral Step Up  Both;2 sets;10 reps;Step Height: 4"    Forward Step Up  Both;2 sets;10 reps;Step Height: 4"      Modalities   Modalities  Cryotherapy      Cryotherapy   Number Minutes Cryotherapy  15 Minutes    Cryotherapy Location  Knee   right   Type of Cryotherapy  Ice pack               PT Short Term Goals - 05/19/18 1109      PT SHORT TERM GOAL #1   Title  independent with intiial HEP    Baseline  doing HEP from HHPT plus bike    Status  Achieved        PT Long Term Goals - 07/15/18 1009      PT LONG TERM GOAL #6   Title  patient able to walk in her community without difficulty and no hesitation on curbs    Status  On-going            Plan - 07/28/18 1612    Clinical Impression Statement  Pt demonstrated improved confidence with negotiating steps during todays session. Pt was able to walk  to and step up on box without hesitation. Pt required HHA with high level balance activities due to some LOB.     Rehab Potential  Good    PT Frequency  2x / week    PT Duration  4 weeks    PT Treatment/Interventions  ADLs/Self Care Home Management;Electrical Stimulation;Moist Heat;Therapeutic exercise;Therapeutic activities;Functional mobility training;Patient/family education;Manual techniques;Gait training;Stair training;Balance training;Neuromuscular re-education;Scar mobilization;Vasopneumatic Device    PT Next Visit Plan  Continue progressing function balance exercises.    PT Home Exercise Plan  Bridges, ball squeeze and bridge, Hip abd/add, ball squeezes    Consulted and Agree with Plan of Care  Patient       Patient will benefit from skilled therapeutic intervention in order to improve the following deficits and impairments:  Abnormal gait, Decreased range of motion, Difficulty walking, Decreased endurance, Decreased activity tolerance, Pain, Impaired flexibility, Decreased scar mobility, Decreased balance, Decreased mobility, Decreased strength, Increased edema  Visit Diagnosis: Stiffness of right knee, not elsewhere classified  Difficulty in walking, not elsewhere classified  Localized edema  Acute pain of right knee  Acute left-sided low back pain with left-sided sciatica     Problem List Patient Active Problem List   Diagnosis Date Noted  . Primary osteoarthritis of right knee 04/08/2018  . Malignant neoplasm of upper-outer quadrant of right breast in female, estrogen receptor positive ([place]) 06/29/2017  . Unspecified vitamin D deficiency 11/27/2013  . Hematuria 10/31/2013  . Physical exam, annual 08/02/2013  . Other and unspecified hyperlipidemia 07/31/2013  . Diabetes mellitus without complication ([place])   . Hypertension   . Arthritis     Howell Rucks, Dayle Points 07/28/2018, 4:17 PM  [place] [address] [place], [place], [address] Phone: [phone]   Fax:  [phone]  Name: Kristen Hill MRN: [id number] Date of Birth: August 10, 1926

## 2018-08-02 ENCOUNTER — Encounter: Payer: Self-pay | Admitting: Physical Therapy

## 2018-08-02 ENCOUNTER — Ambulatory Visit: Payer: Medicare PPO | Attending: Orthopedic Surgery | Admitting: Physical Therapy

## 2018-08-02 DIAGNOSIS — M5442 Lumbago with sciatica, left side: Secondary | ICD-10-CM | POA: Diagnosis not present

## 2018-08-02 DIAGNOSIS — R600 Localized edema: Secondary | ICD-10-CM | POA: Insufficient documentation

## 2018-08-02 DIAGNOSIS — M25561 Pain in right knee: Secondary | ICD-10-CM | POA: Insufficient documentation

## 2018-08-02 DIAGNOSIS — R262 Difficulty in walking, not elsewhere classified: Secondary | ICD-10-CM | POA: Insufficient documentation

## 2018-08-02 DIAGNOSIS — M25661 Stiffness of right knee, not elsewhere classified: Secondary | ICD-10-CM | POA: Diagnosis not present

## 2018-08-02 NOTE — Therapy (Signed)
[address] [place], [place], [address] Phone: [phone]   Fax:  [phone]  Physical Therapy Treatment  Patient Details  Name: Kristen Hill MRN: [id number] Date of Birth: 05/26/1926 Referring Provider (PT): Dorna Leitz   Encounter Date: 08/02/2018  PT End of Session - 08/02/18 1152    Visit Number  22    Date for PT Re-Evaluation  08/10/18    PT Start Time  1050    PT Stop Time  1140    PT Time Calculation (min)  50 min    Activity Tolerance  Patient tolerated treatment well    Behavior During Therapy  [facility] for tasks assessed/performed       Past Medical History:  Diagnosis Date  . Aortic atherosclerosis ([place])   . Cancer [facility])    right breast  . Diabetes mellitus without complication ([place]) 1950   type 2  . Glaucoma    left eye  . Hematuria 10/31/2013  . History of external beam radiation therapy 07/21/17- 08/11/17   Right Breast 2.66 Gy X 16 fractions  . Hypertension   . Jaundice    in 1947  . Jaundice 1946  . Measles as a child  . Multinodular goiter   . Mumps as a child  . OA (osteoarthritis)    right knee, torn meniscus lateral and medial  . Other and unspecified hyperlipidemia 07/31/2013  . Personal history of radiation therapy   . Physical exam, annual 08/02/2013   [facility] Dr Trinna Post at [facility] Dr Farris Has, Retinal specialist Dermatolgy  . Thyroid nodule   . Wears glasses   . Wears partial dentures     Past Surgical History:  Procedure Laterality Date  . BREAST LUMPECTOMY Right 06/14/2017  . BREAST LUMPECTOMY WITH RADIOACTIVE SEED LOCALIZATION Right 06/14/2017   Procedure: [place] BREAST LUMPECTOMY WITH RADIOACTIVE SEED LOCALIZATION ERAS PATHWAY;  Surgeon: Jovita Kussmaul, MD;  Location: [place];  Service: General;  Laterality: Right;  . COLONOSCOPY  09/2015  . EYE SURGERY  2009 and 2010   cataract both eyes  . EYE SURGERY  2012   laser to left eye, chalazion was  removed with laser  . KNEE SURGERY Right    medial meniscus and lateral menicus  . MULTIPLE TOOTH EXTRACTIONS    . PARTIAL KNEE ARTHROPLASTY Right 04/08/2018   Procedure: RIGHT UNICOMPARTMENTAL KNEE;  Surgeon: Dorna Leitz, MD;  [facility];  Service: Orthopedics;  Laterality: Right;  . TONSILLECTOMY  84 yrs old    There were no vitals filed for this visit.  Subjective Assessment - 08/02/18 1118    Subjective  Patient reports that she is stiff and the biggest issue is balance and fear when walking    Currently in Pain?  No/denies                       [facility] Adult PT Treatment/Exercise - 08/02/18 0001      Ambulation/Gait   Gait Comments  gait outside, negotiating multiple curbs, also did a lot of walking in gravel, pine needles and grass, SBA and some instruction on safety      High Level Balance   High Level Balance Activities  Side stepping;Negotiating over obstacles;Direction changes    High Level Balance Comments  Stepping up/over multiple steps in a row, step up on airex pad, airex standing balance 20 sec, standing balance with kicking ball,  tossing ball on the two  airex, one firm and one very unstable needed two instances of Min A to correct LOB               PT Short Term Goals - 05/19/18 1109      PT SHORT TERM GOAL #1   Title  independent with intiial HEP    Baseline  doing HEP from HHPT plus bike    Status  Achieved        PT Long Term Goals - 08/02/18 1154      PT LONG TERM GOAL #6   Title  patient able to walk in her community without difficulty and no hesitation on curbs    Status  Partially Met            Plan - 08/02/18 1152    Clinical Impression Statement  Patient seems to be getting more and more confident and having less issues with walking and balance, she still has fear and her first few steps look unstable, she did better with the curbs having less hesitancy    PT Next Visit Plan  work on the balance and functional  gait as this seems to be her biggest limitation in functional safety    Consulted and Agree with Plan of Care  Patient       Patient will benefit from skilled therapeutic intervention in order to improve the following deficits and impairments:  Abnormal gait, Decreased range of motion, Difficulty walking, Decreased endurance, Decreased activity tolerance, Pain, Impaired flexibility, Decreased scar mobility, Decreased balance, Decreased mobility, Decreased strength, Increased edema  Visit Diagnosis: Stiffness of right knee, not elsewhere classified  Difficulty in walking, not elsewhere classified     Problem List Patient Active Problem List   Diagnosis Date Noted  . Primary osteoarthritis of right knee 04/08/2018  . Malignant neoplasm of upper-outer quadrant of right breast in female, estrogen receptor positive ([place]) 06/29/2017  . Unspecified vitamin D deficiency 11/27/2013  . Hematuria 10/31/2013  . Physical exam, annual 08/02/2013  . Other and unspecified hyperlipidemia 07/31/2013  . Diabetes mellitus without complication ([place])   . Hypertension   . Arthritis     Sumner Boast., PT 08/02/2018, 11:54 AM  [place] [address] [place], [place], [address] Phone: [phone]   Fax:  [phone]  Name: Kristen Hill MRN: [id number] Date of Birth: 1926/08/04

## 2018-08-04 ENCOUNTER — Ambulatory Visit: Payer: Medicare PPO | Admitting: Physical Therapy

## 2018-08-04 ENCOUNTER — Encounter: Payer: Medicare PPO | Admitting: Physical Therapy

## 2018-08-04 DIAGNOSIS — M5442 Lumbago with sciatica, left side: Secondary | ICD-10-CM | POA: Diagnosis not present

## 2018-08-04 DIAGNOSIS — M25661 Stiffness of right knee, not elsewhere classified: Secondary | ICD-10-CM

## 2018-08-04 DIAGNOSIS — R262 Difficulty in walking, not elsewhere classified: Secondary | ICD-10-CM

## 2018-08-04 DIAGNOSIS — H04123 Dry eye syndrome of bilateral lacrimal glands: Secondary | ICD-10-CM | POA: Diagnosis not present

## 2018-08-04 DIAGNOSIS — M25561 Pain in right knee: Secondary | ICD-10-CM

## 2018-08-04 DIAGNOSIS — H40052 Ocular hypertension, left eye: Secondary | ICD-10-CM | POA: Diagnosis not present

## 2018-08-04 DIAGNOSIS — R600 Localized edema: Secondary | ICD-10-CM

## 2018-08-04 DIAGNOSIS — H524 Presbyopia: Secondary | ICD-10-CM | POA: Diagnosis not present

## 2018-08-04 DIAGNOSIS — H16223 Keratoconjunctivitis sicca, not specified as Sjogren's, bilateral: Secondary | ICD-10-CM | POA: Diagnosis not present

## 2018-08-04 DIAGNOSIS — Z7984 Long term (current) use of oral hypoglycemic drugs: Secondary | ICD-10-CM | POA: Diagnosis not present

## 2018-08-04 DIAGNOSIS — E119 Type 2 diabetes mellitus without complications: Secondary | ICD-10-CM | POA: Diagnosis not present

## 2018-08-04 DIAGNOSIS — H43812 Vitreous degeneration, left eye: Secondary | ICD-10-CM | POA: Diagnosis not present

## 2018-08-04 DIAGNOSIS — H5203 Hypermetropia, bilateral: Secondary | ICD-10-CM | POA: Diagnosis not present

## 2018-08-04 DIAGNOSIS — H52203 Unspecified astigmatism, bilateral: Secondary | ICD-10-CM | POA: Diagnosis not present

## 2018-08-04 NOTE — Therapy (Addendum)
[place] [address] [place] [place], [place], [address] Phone: [phone]   Fax:  [phone]  Physical Therapy Treatment  Patient Details  Name: Kristen Hill MRN: [id number] Date of Birth: 13-Mar-1926 Referring Provider (PT): Dorna Leitz   Encounter Date: 08/04/2018  PT End of Session - 08/04/18 1611    Visit Number  23    Date for PT Re-Evaluation  08/10/18    PT Start Time  1529    PT Stop Time  1628    PT Time Calculation (min)  59 min    Activity Tolerance  Patient tolerated treatment well    Behavior During Therapy  [facility] for tasks assessed/performed       Past Medical History:  Diagnosis Date  . Aortic atherosclerosis ([place])   . Cancer [facility])    right breast  . Diabetes mellitus without complication ([place]) 7824   type 2  . Glaucoma    left eye  . Hematuria 10/31/2013  . History of external beam radiation therapy 07/21/17- 08/11/17   Right Breast 2.66 Gy X 16 fractions  . Hypertension   . Jaundice    in 1947  . Jaundice 1946  . Measles as a child  . Multinodular goiter   . Mumps as a child  . OA (osteoarthritis)    right knee, torn meniscus lateral and medial  . Other and unspecified hyperlipidemia 07/31/2013  . Personal history of radiation therapy   . Physical exam, annual 08/02/2013   Se[facility] Dr Trinna Post at [facility] Dr Farris Has, Retinal specialist Dermatolgy  . Thyroid nodule   . Wears glasses   . Wears partial dentures     Past Surgical History:  Procedure Laterality Date  . BREAST LUMPECTOMY Right 06/14/2017  . BREAST LUMPECTOMY WITH RADIOACTIVE SEED LOCALIZATION Right 06/14/2017   Procedure: [place] BREAST LUMPECTOMY WITH RADIOACTIVE SEED LOCALIZATION ERAS PATHWAY;  Surgeon: Jovita Kussmaul, MD;  Location: [place];  Service: General;  Laterality: Right;  . COLONOSCOPY  09/2015  . EYE SURGERY  2009 and 2010   cataract both eyes  . EYE SURGERY  2012   laser to left eye, chalazion was  removed with laser  . KNEE SURGERY Right    medial meniscus and lateral menicus  . MULTIPLE TOOTH EXTRACTIONS    . PARTIAL KNEE ARTHROPLASTY Right 04/08/2018   Procedure: RIGHT UNICOMPARTMENTAL KNEE;  Surgeon: Dorna Leitz, MD;  [facility];  Service: Orthopedics;  Laterality: Right;  . TONSILLECTOMY  84 yrs old    There were no vitals filed for this visit.  Subjective Assessment - 08/04/18 1547    Subjective  "Im doing good, no pain"    Currently in Pain?  No/denies                       [facility] Adult PT Treatment/Exercise - 08/04/18 0001      Ambulation/Gait   Gait Comments  gait outside, negotiating multiple curbs, also did a lot of walking in gravel, pine needles and grass, SBA and some instruction on safety      High Level Balance   High Level Balance Activities  Tandem walking;Negotiating over obstacles;Negotitating around obstacles    High Level Balance Comments  step up on and off the opp side airex, airex standing balance, x20      Knee/Hip Exercises: Machines for Strengthening   Cybex Knee Extension  10# 2x10      Knee/Hip Exercises:  Standing   Lateral Step Up  Both;1 set;10 reps;Step Height: 4"      Modalities   Modalities  Vasopneumatic      Cryotherapy   Number Minutes Cryotherapy  --    Cryotherapy Location  --    Type of Cryotherapy  --      Vasopneumatic   Number Minutes Vasopneumatic   15 minutes    Vasopnuematic Location   Knee    Vasopneumatic Pressure  Medium               PT Short Term Goals - 05/19/18 1109      PT SHORT TERM GOAL #1   Title  independent with intiial HEP    Baseline  doing HEP from HHPT plus bike    Status  Achieved        PT Long Term Goals - 08/02/18 1154      PT LONG TERM GOAL #6   Title  patient able to walk in her community without difficulty and no hesitation on curbs    Status  Partially Met            Plan - 08/04/18 1611    Clinical Impression Statement  Pt was very motivated  to get started today. Pt demonstratd improved balance and increased confidence as the session went on. Pt required contact gaurd assist with airex  and obsticle activities due to LOB X2.     Rehab Potential  Good    PT Frequency  2x / week    PT Duration  4 weeks    PT Treatment/Interventions  ADLs/Self Care Home Management;Electrical Stimulation;Moist Heat;Therapeutic exercise;Therapeutic activities;Functional mobility training;Patient/family education;Manual techniques;Gait training;Stair training;Balance training;Neuromuscular re-education;Scar mobilization;Vasopneumatic Device    PT Next Visit Plan  work on the balance and functional gait as this seems to be her biggest limitation in functional safety    PT Home Exercise Plan  Bridges, ball squeeze and bridge, Hip abd/add, ball squeezes    Consulted and Agree with Plan of Care  Patient       Patient will benefit from skilled therapeutic intervention in order to improve the following deficits and impairments:  Abnormal gait, Decreased range of motion, Difficulty walking, Decreased endurance, Decreased activity tolerance, Pain, Impaired flexibility, Decreased scar mobility, Decreased balance, Decreased mobility, Decreased strength, Increased edema  Visit Diagnosis: Stiffness of right knee, not elsewhere classified  Difficulty in walking, not elsewhere classified  Localized edema  Acute pain of right knee  Acute left-sided low back pain with left-sided sciatica     Problem List Patient Active Problem List   Diagnosis Date Noted  . Primary osteoarthritis of right knee 04/08/2018  . Malignant neoplasm of upper-outer quadrant of right breast in female, estrogen receptor positive ([facility]) 06/29/2017  . Unspecified vitamin D deficiency 11/27/2013  . Hematuria 10/31/2013  . Physical exam, annual 08/02/2013  . Other and unspecified hyperlipidemia 07/31/2013  . Diabetes mellitus without complication ([facility])   . Hypertension   . Arthritis      [name],[name], SPTA 08/04/2018, 4:19 PM  [facility] [address] [place], [place], [address] Phone: [phone]   Fax:  [phone]  Name: Kristen Hill MRN: [id number] Date of Birth: 12/02/1925   

## 2018-08-08 ENCOUNTER — Encounter: Payer: Self-pay | Admitting: Physical Therapy

## 2018-08-08 ENCOUNTER — Ambulatory Visit: Payer: Medicare PPO | Admitting: Physical Therapy

## 2018-08-08 DIAGNOSIS — M25661 Stiffness of right knee, not elsewhere classified: Secondary | ICD-10-CM

## 2018-08-08 DIAGNOSIS — M5442 Lumbago with sciatica, left side: Secondary | ICD-10-CM | POA: Diagnosis not present

## 2018-08-08 DIAGNOSIS — M25561 Pain in right knee: Secondary | ICD-10-CM | POA: Diagnosis not present

## 2018-08-08 DIAGNOSIS — R262 Difficulty in walking, not elsewhere classified: Secondary | ICD-10-CM | POA: Diagnosis not present

## 2018-08-08 DIAGNOSIS — R600 Localized edema: Secondary | ICD-10-CM | POA: Diagnosis not present

## 2018-08-08 NOTE — Therapy (Signed)
[address] [place], [place], [address] Phone: [phone]   Fax:  [phone]  Physical Therapy Treatment  Patient Details  Name: Kristen Hill MRN: [id number] Date of Birth: 06-05-1926 Referring Provider (PT): Dorna Leitz   Encounter Date: 08/08/2018  PT End of Session - 08/08/18 1137    Visit Number  24    Date for PT Re-Evaluation  08/10/18    PT Start Time  1050    PT Stop Time  1150    PT Time Calculation (min)  60 min       Past Medical History:  Diagnosis Date  . Aortic atherosclerosis ([place])   . Cancer [facility])    right breast  . Diabetes mellitus without complication ([place]) 3500   type 2  . Glaucoma    left eye  . Hematuria 10/31/2013  . History of external beam radiation therapy 07/21/17- 08/11/17   Right Breast 2.66 Gy X 16 fractions  . Hypertension   . Jaundice    in 1947  . Jaundice 1946  . Measles as a child  . Multinodular goiter   . Mumps as a child  . OA (osteoarthritis)    right knee, torn meniscus lateral and medial  . Other and unspecified hyperlipidemia 07/31/2013  . Personal history of radiation therapy   . Physical exam, annual 08/02/2013   Sees Dr Trinna Post at [facility] Dr Farris Has, Retinal specialist Dermatolgy  . Thyroid nodule   . Wears glasses   . Wears partial dentures     Past Surgical History:  Procedure Laterality Date  . BREAST LUMPECTOMY Right 06/14/2017  . BREAST LUMPECTOMY WITH RADIOACTIVE SEED LOCALIZATION Right 06/14/2017   Procedure: [place] BREAST LUMPECTOMY WITH RADIOACTIVE SEED LOCALIZATION ERAS PATHWAY;  Surgeon: Jovita Kussmaul, MD;  Location: [facility];  Service: General;  Laterality: Right;  . COLONOSCOPY  09/2015  . EYE SURGERY  2009 and 2010   cataract both eyes  . EYE SURGERY  2012   laser to left eye, chalazion was removed with laser  . KNEE SURGERY Right    medial meniscus and lateral menicus  . MULTIPLE TOOTH EXTRACTIONS    .  PARTIAL KNEE ARTHROPLASTY Right 04/08/2018   Procedure: RIGHT UNICOMPARTMENTAL KNEE;  Surgeon: Dorna Leitz, MD;  [facility];  Service: Orthopedics;  Laterality: Right;  . TONSILLECTOMY  84 yrs old    There were no vitals filed for this visit.  Subjective Assessment - 08/08/18 1054    Subjective  doing pretty well    Currently in Pain?  No/denies                       [facility] Adult PT Treatment/Exercise - 08/08/18 0001      High Level Balance   High Level Balance Activities  Side stepping;Backward walking;Marching forwards;Negotitating around obstacles   HHA with 3# ankle wts   High Level Balance Comments  HHA on dynadisc   balance and squats     Knee/Hip Exercises: Aerobic   Nustep  L 4 7 min - LE only      Knee/Hip Exercises: Machines for Strengthening   Cybex Knee Extension  10# 3 sets 10    Cybex Knee Flexion  25# 3 sets 10    Cybex Leg Press  30# 3 sets 10      Knee/Hip Exercises: Standing   Other Standing Knee Exercises  3# step fwd and back over  roll 10 each and laterally 10 each with HHA needed             PT Education - 08/08/18 1109    Education Details  reviewed gym machines for set up,use and wts- issued Handout to take ot [facility]) Educated  Patient    Methods  Explanation;Demonstration;Handout    Comprehension  Verbalized understanding       PT Short Term Goals - 05/19/18 1109      PT SHORT TERM GOAL #1   Title  independent with intiial HEP    Baseline  doing HEP from HHPT plus bike    Status  Achieved        PT Long Term Goals - 08/02/18 1154      PT LONG TERM GOAL #6   Title  patient able to walk in her community without difficulty and no hesitation on curbs    Status  Partially Met            Plan - 08/08/18 1138    Clinical Impression Statement  educated on gym transition at D/C, handout issued and pt with good understanding. pt still requiring HHA with dynamic balance activities/exercises    PT  Treatment/Interventions  ADLs/Self Care Home Management;Electrical Stimulation;Moist Heat;Therapeutic exercise;Therapeutic activities;Functional mobility training;Patient/family education;Manual techniques;Gait training;Stair training;Balance training;Neuromuscular re-education;Scar mobilization;Vasopneumatic Device    PT Next Visit Plan  Assess goals and D/C       Patient will benefit from skilled therapeutic intervention in order to improve the following deficits and impairments:  Abnormal gait, Decreased range of motion, Difficulty walking, Decreased endurance, Decreased activity tolerance, Pain, Impaired flexibility, Decreased scar mobility, Decreased balance, Decreased mobility, Decreased strength, Increased edema  Visit Diagnosis: Stiffness of right knee, not elsewhere classified  Difficulty in walking, not elsewhere classified     Problem List Patient Active Problem List   Diagnosis Date Noted  . Primary osteoarthritis of right knee 04/08/2018  . Malignant neoplasm of upper-outer quadrant of right breast in female, estrogen receptor positive ([place]) 06/29/2017  . Unspecified vitamin D deficiency 11/27/2013  . Hematuria 10/31/2013  . Physical exam, annual 08/02/2013  . Other and unspecified hyperlipidemia 07/31/2013  . Diabetes mellitus without complication ([place])   . Hypertension   . Arthritis     Ned Kakar,[name] PTA 08/08/2018, 11:41 AM  [address] [place], [place], [address] Phone: [phone]   Fax:  [phone]  Name: Kristen Hill MRN: [id number] Date of Birth: 1926-07-17

## 2018-08-09 ENCOUNTER — Encounter: Payer: Self-pay | Admitting: Physical Therapy

## 2018-08-09 ENCOUNTER — Encounter: Payer: Medicare PPO | Admitting: Physical Therapy

## 2018-08-09 ENCOUNTER — Ambulatory Visit: Payer: Medicare PPO | Admitting: Physical Therapy

## 2018-08-09 DIAGNOSIS — M25661 Stiffness of right knee, not elsewhere classified: Secondary | ICD-10-CM | POA: Diagnosis not present

## 2018-08-09 DIAGNOSIS — M5442 Lumbago with sciatica, left side: Secondary | ICD-10-CM | POA: Diagnosis not present

## 2018-08-09 DIAGNOSIS — R262 Difficulty in walking, not elsewhere classified: Secondary | ICD-10-CM

## 2018-08-09 DIAGNOSIS — R600 Localized edema: Secondary | ICD-10-CM | POA: Diagnosis not present

## 2018-08-09 DIAGNOSIS — M25561 Pain in right knee: Secondary | ICD-10-CM | POA: Diagnosis not present

## 2018-08-09 NOTE — Therapy (Signed)
[address] [place], [place], [address] Phone: [phone]   Fax:  [phone]  Physical Therapy Treatment  Patient Details  Name: Kristen Hill MRN: [id number] Date of Birth: [birth year] Referring Provider (PT): Dorna Leitz   Encounter Date: 08/09/2018  PT End of Session - 08/09/18 1601    Visit Number  25    PT Start Time  1520    PT Stop Time  1602    PT Time Calculation (min)  42 min    Activity Tolerance  Patient tolerated treatment well    Behavior During Therapy  [facility] for tasks assessed/performed       Past Medical History:  Diagnosis Date  . Aortic atherosclerosis ([place])   . Cancer [facility])    right breast  . Diabetes mellitus without complication ([place]) 1740   type 2  . Glaucoma    left eye  . Hematuria 10/31/2013  . History of external beam radiation therapy 07/21/17- 08/11/17   Right Breast 2.66 Gy X 16 fractions  . Hypertension   . Jaundice    in 1947  . Jaundice 1946  . Measles as a child  . Multinodular goiter   . Mumps as a child  . OA (osteoarthritis)    right knee, torn meniscus lateral and medial  . Other and unspecified hyperlipidemia 07/31/2013  . Personal history of radiation therapy   . Physical exam, annual 08/02/2013   Sees Dr Trinna Post at [facility] Dr Farris Has, Retinal specialist Dermatolgy  . Thyroid nodule   . Wears glasses   . Wears partial dentures     Past Surgical History:  Procedure Laterality Date  . BREAST LUMPECTOMY Right 06/14/2017  . BREAST LUMPECTOMY WITH RADIOACTIVE SEED LOCALIZATION Right 06/14/2017   Procedure: [place] BREAST LUMPECTOMY WITH RADIOACTIVE SEED LOCALIZATION ERAS PATHWAY;  Surgeon: Jovita Kussmaul, MD;  Location: [facility];  Service: General;  Laterality: Right;  . COLONOSCOPY  09/2015  . EYE SURGERY  2009 and 2010   cataract both eyes  . EYE SURGERY  2012   laser to left eye, chalazion was removed with laser  . KNEE SURGERY  Right    medial meniscus and lateral menicus  . MULTIPLE TOOTH EXTRACTIONS    . PARTIAL KNEE ARTHROPLASTY Right 04/08/2018   Procedure: RIGHT UNICOMPARTMENTAL KNEE;  Surgeon: Dorna Leitz, MD;  [facility];  Service: Orthopedics;  Laterality: Right;  . TONSILLECTOMY  84 yrs old    There were no vitals filed for this visit.  Subjective Assessment - 08/09/18 1523    Subjective  I really think I am going to be okay    Currently in Pain?  No/denies                       [facility] Adult PT Treatment/Exercise - 08/09/18 0001      High Level Balance   High Level Balance Activities  Side stepping    High Level Balance Comments  gave HEP that focused on balance gave her ideas of how to be safe but test herself      Knee/Hip Exercises: Aerobic   Recumbent Bike  6 minutes level 1    Nustep  L 4 7 min - LE only      Knee/Hip Exercises: Machines for Strengthening   Cybex Knee Extension  10# 3 sets 10    Cybex Knee Flexion  25# 3 sets 10    Cybex  Leg Press  30# 3 sets 10, went over the exercises at the gym, how to adjust weights and sets               PT Short Term Goals - 05/19/18 1109      PT SHORT TERM GOAL #1   Title  independent with intiial HEP    Baseline  doing HEP from HHPT plus bike    Status  Achieved        PT Long Term Goals - 08/09/18 1603      PT LONG TERM GOAL #1   Title  decrease pain  50%    Status  Achieved      PT LONG TERM GOAL #2   Title  understand RICE    Status  Achieved      PT LONG TERM GOAL #3   Title  decrease TUG test to 15 seconds    Status  Achieved      PT LONG TERM GOAL #4   Title  increase AROM of the right knee to 5-115 degrees flexion    Status  Achieved      PT LONG TERM GOAL #5   Title  walk without cane for most distances    Status  Achieved      PT LONG TERM GOAL #6   Title  patient able to walk in her community without difficulty and no hesitation on curbs    Status  Achieved            Plan  - 08/09/18 1602    Clinical Impression Statement  Went over today the gym exercises and how to assure safety, also gave HEP that was focused on balance, how to be safe with this, the exercise classes offered at the gym, went over the issues that I saw with her on what was a risk for falls especially the curbs    PT Next Visit Plan  D/C  Goals Met    Consulted and Agree with Plan of Care  Patient       Patient will benefit from skilled therapeutic intervention in order to improve the following deficits and impairments:  Abnormal gait, Decreased range of motion, Difficulty walking, Decreased endurance, Decreased activity tolerance, Pain, Impaired flexibility, Decreased scar mobility, Decreased balance, Decreased mobility, Decreased strength, Increased edema  Visit Diagnosis: Stiffness of right knee, not elsewhere classified  Difficulty in walking, not elsewhere classified  Localized edema     Problem List Patient Active Problem List   Diagnosis Date Noted  . Primary osteoarthritis of right knee 04/08/2018  . Malignant neoplasm of upper-outer quadrant of right breast in female, estrogen receptor positive ([place]) 06/29/2017  . Unspecified vitamin D deficiency 11/27/2013  . Hematuria 10/31/2013  . Physical exam, annual 08/02/2013  . Other and unspecified hyperlipidemia 07/31/2013  . Diabetes mellitus without complication ([place])   . Hypertension   . Arthritis     Sumner Boast., PT 08/09/2018, 4:04 PM  [address] [place], [place], [address] Phone: [phone]   Fax:  [phone]  Name: Kristen Hill MRN: [id number] Date of Birth: [birth year]

## 2018-09-01 DIAGNOSIS — M7062 Trochanteric bursitis, left hip: Secondary | ICD-10-CM | POA: Diagnosis not present

## 2018-09-01 DIAGNOSIS — Z96651 Presence of right artificial knee joint: Secondary | ICD-10-CM | POA: Diagnosis not present

## 2018-09-01 DIAGNOSIS — M545 Low back pain: Secondary | ICD-10-CM | POA: Diagnosis not present

## 2018-12-01 DIAGNOSIS — M25551 Pain in right hip: Secondary | ICD-10-CM | POA: Diagnosis not present

## 2018-12-01 DIAGNOSIS — M25561 Pain in right knee: Secondary | ICD-10-CM | POA: Diagnosis not present

## 2019-02-01 DIAGNOSIS — M25552 Pain in left hip: Secondary | ICD-10-CM | POA: Diagnosis not present

## 2019-02-01 DIAGNOSIS — M5117 Intervertebral disc disorders with radiculopathy, lumbosacral region: Secondary | ICD-10-CM | POA: Diagnosis not present

## 2019-02-01 DIAGNOSIS — M9902 Segmental and somatic dysfunction of thoracic region: Secondary | ICD-10-CM | POA: Diagnosis not present

## 2019-02-01 DIAGNOSIS — M62830 Muscle spasm of back: Secondary | ICD-10-CM | POA: Diagnosis not present

## 2019-02-01 DIAGNOSIS — M9903 Segmental and somatic dysfunction of lumbar region: Secondary | ICD-10-CM | POA: Diagnosis not present

## 2019-02-03 DIAGNOSIS — M5117 Intervertebral disc disorders with radiculopathy, lumbosacral region: Secondary | ICD-10-CM | POA: Diagnosis not present

## 2019-02-03 DIAGNOSIS — M9903 Segmental and somatic dysfunction of lumbar region: Secondary | ICD-10-CM | POA: Diagnosis not present

## 2019-02-03 DIAGNOSIS — M25552 Pain in left hip: Secondary | ICD-10-CM | POA: Diagnosis not present

## 2019-02-03 DIAGNOSIS — M9902 Segmental and somatic dysfunction of thoracic region: Secondary | ICD-10-CM | POA: Diagnosis not present

## 2019-02-03 DIAGNOSIS — M62830 Muscle spasm of back: Secondary | ICD-10-CM | POA: Diagnosis not present

## 2019-02-06 DIAGNOSIS — M9902 Segmental and somatic dysfunction of thoracic region: Secondary | ICD-10-CM | POA: Diagnosis not present

## 2019-02-06 DIAGNOSIS — M62830 Muscle spasm of back: Secondary | ICD-10-CM | POA: Diagnosis not present

## 2019-02-06 DIAGNOSIS — M9903 Segmental and somatic dysfunction of lumbar region: Secondary | ICD-10-CM | POA: Diagnosis not present

## 2019-02-06 DIAGNOSIS — M25552 Pain in left hip: Secondary | ICD-10-CM | POA: Diagnosis not present

## 2019-02-06 DIAGNOSIS — M5117 Intervertebral disc disorders with radiculopathy, lumbosacral region: Secondary | ICD-10-CM | POA: Diagnosis not present

## 2019-02-09 DIAGNOSIS — M25552 Pain in left hip: Secondary | ICD-10-CM | POA: Diagnosis not present

## 2019-02-09 DIAGNOSIS — M62830 Muscle spasm of back: Secondary | ICD-10-CM | POA: Diagnosis not present

## 2019-02-09 DIAGNOSIS — M5117 Intervertebral disc disorders with radiculopathy, lumbosacral region: Secondary | ICD-10-CM | POA: Diagnosis not present

## 2019-02-09 DIAGNOSIS — M9903 Segmental and somatic dysfunction of lumbar region: Secondary | ICD-10-CM | POA: Diagnosis not present

## 2019-02-09 DIAGNOSIS — M9902 Segmental and somatic dysfunction of thoracic region: Secondary | ICD-10-CM | POA: Diagnosis not present

## 2019-02-16 DIAGNOSIS — M5117 Intervertebral disc disorders with radiculopathy, lumbosacral region: Secondary | ICD-10-CM | POA: Diagnosis not present

## 2019-02-16 DIAGNOSIS — M25552 Pain in left hip: Secondary | ICD-10-CM | POA: Diagnosis not present

## 2019-02-16 DIAGNOSIS — M62830 Muscle spasm of back: Secondary | ICD-10-CM | POA: Diagnosis not present

## 2019-02-16 DIAGNOSIS — M9902 Segmental and somatic dysfunction of thoracic region: Secondary | ICD-10-CM | POA: Diagnosis not present

## 2019-02-16 DIAGNOSIS — M9903 Segmental and somatic dysfunction of lumbar region: Secondary | ICD-10-CM | POA: Diagnosis not present

## 2019-02-21 DIAGNOSIS — M9902 Segmental and somatic dysfunction of thoracic region: Secondary | ICD-10-CM | POA: Diagnosis not present

## 2019-02-21 DIAGNOSIS — M62830 Muscle spasm of back: Secondary | ICD-10-CM | POA: Diagnosis not present

## 2019-02-21 DIAGNOSIS — M25552 Pain in left hip: Secondary | ICD-10-CM | POA: Diagnosis not present

## 2019-02-21 DIAGNOSIS — M9903 Segmental and somatic dysfunction of lumbar region: Secondary | ICD-10-CM | POA: Diagnosis not present

## 2019-02-21 DIAGNOSIS — M5117 Intervertebral disc disorders with radiculopathy, lumbosacral region: Secondary | ICD-10-CM | POA: Diagnosis not present

## 2019-03-01 DIAGNOSIS — M9902 Segmental and somatic dysfunction of thoracic region: Secondary | ICD-10-CM | POA: Diagnosis not present

## 2019-03-01 DIAGNOSIS — M62830 Muscle spasm of back: Secondary | ICD-10-CM | POA: Diagnosis not present

## 2019-03-01 DIAGNOSIS — M25552 Pain in left hip: Secondary | ICD-10-CM | POA: Diagnosis not present

## 2019-03-01 DIAGNOSIS — M9903 Segmental and somatic dysfunction of lumbar region: Secondary | ICD-10-CM | POA: Diagnosis not present

## 2019-03-01 DIAGNOSIS — M5117 Intervertebral disc disorders with radiculopathy, lumbosacral region: Secondary | ICD-10-CM | POA: Diagnosis not present

## 2019-03-15 DIAGNOSIS — M5117 Intervertebral disc disorders with radiculopathy, lumbosacral region: Secondary | ICD-10-CM | POA: Diagnosis not present

## 2019-03-15 DIAGNOSIS — M62830 Muscle spasm of back: Secondary | ICD-10-CM | POA: Diagnosis not present

## 2019-03-15 DIAGNOSIS — M9903 Segmental and somatic dysfunction of lumbar region: Secondary | ICD-10-CM | POA: Diagnosis not present

## 2019-03-15 DIAGNOSIS — M25552 Pain in left hip: Secondary | ICD-10-CM | POA: Diagnosis not present

## 2019-03-15 DIAGNOSIS — M9902 Segmental and somatic dysfunction of thoracic region: Secondary | ICD-10-CM | POA: Diagnosis not present

## 2019-03-22 DIAGNOSIS — M62830 Muscle spasm of back: Secondary | ICD-10-CM | POA: Diagnosis not present

## 2019-03-22 DIAGNOSIS — M25552 Pain in left hip: Secondary | ICD-10-CM | POA: Diagnosis not present

## 2019-03-22 DIAGNOSIS — M5117 Intervertebral disc disorders with radiculopathy, lumbosacral region: Secondary | ICD-10-CM | POA: Diagnosis not present

## 2019-03-22 DIAGNOSIS — M9902 Segmental and somatic dysfunction of thoracic region: Secondary | ICD-10-CM | POA: Diagnosis not present

## 2019-03-22 DIAGNOSIS — M9903 Segmental and somatic dysfunction of lumbar region: Secondary | ICD-10-CM | POA: Diagnosis not present

## 2019-04-27 DIAGNOSIS — M62830 Muscle spasm of back: Secondary | ICD-10-CM | POA: Diagnosis not present

## 2019-04-27 DIAGNOSIS — M25552 Pain in left hip: Secondary | ICD-10-CM | POA: Diagnosis not present

## 2019-04-27 DIAGNOSIS — M9903 Segmental and somatic dysfunction of lumbar region: Secondary | ICD-10-CM | POA: Diagnosis not present

## 2019-04-27 DIAGNOSIS — M9902 Segmental and somatic dysfunction of thoracic region: Secondary | ICD-10-CM | POA: Diagnosis not present

## 2019-04-27 DIAGNOSIS — M5117 Intervertebral disc disorders with radiculopathy, lumbosacral region: Secondary | ICD-10-CM | POA: Diagnosis not present

## 2019-05-09 DIAGNOSIS — M25562 Pain in left knee: Secondary | ICD-10-CM | POA: Diagnosis not present

## 2019-05-09 DIAGNOSIS — M25569 Pain in unspecified knee: Secondary | ICD-10-CM | POA: Diagnosis not present

## 2019-05-09 DIAGNOSIS — M25561 Pain in right knee: Secondary | ICD-10-CM | POA: Diagnosis not present

## 2019-05-18 DIAGNOSIS — Z83511 Family history of glaucoma: Secondary | ICD-10-CM | POA: Diagnosis not present

## 2019-05-18 DIAGNOSIS — H40052 Ocular hypertension, left eye: Secondary | ICD-10-CM | POA: Diagnosis not present

## 2019-05-25 DIAGNOSIS — E119 Type 2 diabetes mellitus without complications: Secondary | ICD-10-CM | POA: Diagnosis not present

## 2019-05-25 DIAGNOSIS — E782 Mixed hyperlipidemia: Secondary | ICD-10-CM | POA: Diagnosis not present

## 2019-05-25 DIAGNOSIS — Z0001 Encounter for general adult medical examination with abnormal findings: Secondary | ICD-10-CM | POA: Diagnosis not present

## 2019-05-25 DIAGNOSIS — Z8589 Personal history of malignant neoplasm of other organs and systems: Secondary | ICD-10-CM | POA: Diagnosis not present

## 2019-05-25 DIAGNOSIS — R5381 Other malaise: Secondary | ICD-10-CM | POA: Diagnosis not present

## 2019-05-25 DIAGNOSIS — Z6829 Body mass index (BMI) 29.0-29.9, adult: Secondary | ICD-10-CM | POA: Diagnosis not present

## 2019-05-25 DIAGNOSIS — I10 Essential (primary) hypertension: Secondary | ICD-10-CM | POA: Diagnosis not present

## 2019-06-12 ENCOUNTER — Other Ambulatory Visit: Payer: Self-pay | Admitting: General Surgery

## 2019-06-30 ENCOUNTER — Emergency Department (HOSPITAL_BASED_OUTPATIENT_CLINIC_OR_DEPARTMENT_OTHER): Payer: Medicare PPO

## 2019-06-30 ENCOUNTER — Other Ambulatory Visit: Payer: Self-pay

## 2019-06-30 ENCOUNTER — Encounter (HOSPITAL_BASED_OUTPATIENT_CLINIC_OR_DEPARTMENT_OTHER): Payer: Self-pay | Admitting: Emergency Medicine

## 2019-06-30 ENCOUNTER — Emergency Department (HOSPITAL_BASED_OUTPATIENT_CLINIC_OR_DEPARTMENT_OTHER)
Admission: EM | Admit: 2019-06-30 | Discharge: 2019-06-30 | Disposition: A | Discharge status: Home / Self Care | Payer: Medicare PPO | Attending: Emergency Medicine | Admitting: Emergency Medicine

## 2019-06-30 DIAGNOSIS — Z7982 Long term (current) use of aspirin: Secondary | ICD-10-CM | POA: Diagnosis not present

## 2019-06-30 DIAGNOSIS — Z87891 Personal history of nicotine dependence: Secondary | ICD-10-CM | POA: Insufficient documentation

## 2019-06-30 DIAGNOSIS — E119 Type 2 diabetes mellitus without complications: Secondary | ICD-10-CM | POA: Insufficient documentation

## 2019-06-30 DIAGNOSIS — I10 Essential (primary) hypertension: Secondary | ICD-10-CM | POA: Insufficient documentation

## 2019-06-30 DIAGNOSIS — Y999 Unspecified external cause status: Secondary | ICD-10-CM | POA: Insufficient documentation

## 2019-06-30 DIAGNOSIS — R52 Pain, unspecified: Secondary | ICD-10-CM | POA: Diagnosis not present

## 2019-06-30 DIAGNOSIS — Y9301 Activity, walking, marching and hiking: Secondary | ICD-10-CM | POA: Insufficient documentation

## 2019-06-30 DIAGNOSIS — W010XXA Fall on same level from slipping, tripping and stumbling without subsequent striking against object, initial encounter: Secondary | ICD-10-CM | POA: Diagnosis not present

## 2019-06-30 DIAGNOSIS — S199XXA Unspecified injury of neck, initial encounter: Secondary | ICD-10-CM | POA: Diagnosis not present

## 2019-06-30 DIAGNOSIS — S80211A Abrasion, right knee, initial encounter: Secondary | ICD-10-CM | POA: Diagnosis not present

## 2019-06-30 DIAGNOSIS — Z7984 Long term (current) use of oral hypoglycemic drugs: Secondary | ICD-10-CM | POA: Insufficient documentation

## 2019-06-30 DIAGNOSIS — S0992XA Unspecified injury of nose, initial encounter: Secondary | ICD-10-CM | POA: Insufficient documentation

## 2019-06-30 DIAGNOSIS — M25561 Pain in right knee: Secondary | ICD-10-CM | POA: Diagnosis not present

## 2019-06-30 DIAGNOSIS — W19XXXA Unspecified fall, initial encounter: Secondary | ICD-10-CM | POA: Diagnosis not present

## 2019-06-30 DIAGNOSIS — R040 Epistaxis: Secondary | ICD-10-CM | POA: Diagnosis not present

## 2019-06-30 DIAGNOSIS — Y92014 Private driveway to single-family (private) house as the place of occurrence of the external cause: Secondary | ICD-10-CM | POA: Diagnosis not present

## 2019-06-30 DIAGNOSIS — S8991XA Unspecified injury of right lower leg, initial encounter: Secondary | ICD-10-CM | POA: Diagnosis not present

## 2019-06-30 DIAGNOSIS — Z79899 Other long term (current) drug therapy: Secondary | ICD-10-CM | POA: Insufficient documentation

## 2019-06-30 DIAGNOSIS — S0990XA Unspecified injury of head, initial encounter: Secondary | ICD-10-CM | POA: Diagnosis not present

## 2019-06-30 NOTE — ED Notes (Signed)
Family at bedside. 

## 2019-06-30 NOTE — ED Notes (Addendum)
Patient transported to CT 

## 2019-06-30 NOTE — ED Notes (Signed)
Portable toilet at bedside

## 2019-06-30 NOTE — ED Triage Notes (Signed)
Arrived via EMS, called out for fall in driveway, this am. No LOC, tripped, has abrasions to nose and knees, with bleeding from nose. BP 150/80, HR 92, R 20, O2sat 99% RA, FSBS 130

## 2019-06-30 NOTE — ED Provider Notes (Signed)
[facility] EMERGENCY DEPARTMENT Provider Note   CSN: [id number] Arrival date & time: 06/30/19  1103     History   Chief Complaint Chief Complaint  Patient presents with   Fall    HPI Kristen Hill is a 85 y.o. female  presenting to the emergency department with a mechanical fall.  She reports she tripped walking up her driveway this morning.  She landed on her right knee and also struck her face on the ground.  She had a period of bleeding from her nose which is since stopped.  She denies loss of consciousness.  She reports a very mild frontal headache.  She denies any neck pain.  She denies any blood thinner use.  She reports some soreness in her right knee.  She otherwise denies any pain or soreness in the remainder of her extremities, including her wrists and elbows.  She reports she has had a tetanus shot in the past 5 years.       HPI  Past Medical History:  Diagnosis Date   Aortic atherosclerosis ([place])    Cancer ([place])    right breast   Diabetes mellitus without complication ([place]) [id number]   type 2   Glaucoma    left eye   Hematuria 10/31/2013   History of external beam radiation therapy 07/21/17- 08/11/17   Right Breast 2.66 Gy X 16 fractions   Hypertension    Jaundice    in 1947   Jaundice 1946   Measles as a child   Multinodular goiter    Mumps as a child   OA (osteoarthritis)    right knee, torn meniscus lateral and medial   Other and unspecified hyperlipidemia 07/31/2013   Personal history of radiation therapy    Physical exam, annual 08/02/2013   Sees Dr Trinna Post at [facility] Dr Farris Has, Retinal specialist Dermatolgy   Thyroid nodule    Wears glasses    Wears partial dentures     Patient Active Problem List   Diagnosis Date Noted   Primary osteoarthritis of right knee 04/08/2018   Malignant neoplasm of upper-outer quadrant of right breast in female, estrogen receptor positive ([place]) 06/29/2017    Unspecified vitamin D deficiency 11/27/2013   Hematuria 10/31/2013   Physical exam, annual 08/02/2013   Other and unspecified hyperlipidemia 07/31/2013   Diabetes mellitus without complication ([place])    Hypertension    Arthritis     Past Surgical History:  Procedure Laterality Date   BREAST LUMPECTOMY Right 06/14/2017   BREAST LUMPECTOMY WITH RADIOACTIVE SEED LOCALIZATION Right 06/14/2017   Procedure: [place] BREAST LUMPECTOMY WITH RADIOACTIVE SEED LOCALIZATION ERAS PATHWAY;  Surgeon: Autumn Messing III, MD;  Location: [facility];  Service: General;  Laterality: Right;   COLONOSCOPY  09/2015   EYE SURGERY  2009 and 2010   cataract both eyes   EYE SURGERY  2012   laser to left eye, chalazion was removed with laser   KNEE SURGERY Right    medial meniscus and lateral menicus   MULTIPLE TOOTH EXTRACTIONS     PARTIAL KNEE ARTHROPLASTY Right 04/08/2018   Procedure: RIGHT UNICOMPARTMENTAL KNEE;  Surgeon: Dorna Leitz, MD;  [facility];  Service: Orthopedics;  Laterality: Right;   TONSILLECTOMY  85 yrs old     OB History   No obstetric history on file.      Home Medications    Prior to Admission medications   Medication Sig Start Date End Date Taking? Authorizing Provider  amLODipine (NORVASC) 5  MG tablet Take 1 tablet (5 mg total) by mouth daily. 08/28/13   Mosie Lukes, MD  anastrozole (ARIMIDEX) 1 MG tablet Take 1 tablet (1 mg total) daily by mouth. Patient not taking: Reported on 09/14/2017 09/28/17   Nicholas Lose, MD  aspirin EC 325 MG tablet Take 1 tablet (325 mg total) by mouth 2 (two) times daily after a meal. Take x 1 month post op to decrease risk of blood clots. 04/08/18   Gary Fleet, PA-C  bimatoprost (LUMIGAN) 0.01 % SOLN Place 1 drop into the left eye at bedtime.     [provider]  docusate sodium (COLACE) 100 MG capsule Take 1 capsule (100 mg total) by mouth 2 (two) times daily. 04/08/18   Gary Fleet, PA-C  EPINEPHrine 0.3 mg/0.3 mL IJ SOAJ  injection Inject 0.3 mg into the skin once.  02/12/17   [provider]  ferrous sulfate 325 (65 FE) MG tablet Take 325 mg by mouth every other day.    [provider]  glucose blood (BAYER CONTOUR TEST) test strip Check blood sugars q am and prn Patient not taking: Reported on 03/25/2018 08/28/13   Mosie Lukes, MD  HYDROcodone-acetaminophen (NORCO) 5-325 MG tablet Take 1-2 tablets by mouth every 6 (six) hours as needed for moderate pain. 04/08/18   Gary Fleet, PA-C  metFORMIN (GLUCOPHAGE) 500 MG tablet Take 1 tablet (500 mg total) by mouth 2 (two) times daily with a meal. 08/28/13   Mosie Lukes, MD  MICROLET LANCETS MISC Check BS q am and prn Patient not taking: Reported on 03/25/2018 08/28/13   Mosie Lukes, MD  Multiple Vitamin (MULTIVITAMIN WITH MINERALS) TABS tablet Take 1 tablet by mouth daily.     [provider]  tiZANidine (ZANAFLEX) 2 MG tablet Take 1 tablet (2 mg total) by mouth every 8 (eight) hours as needed for muscle spasms. 04/08/18   Gary Fleet, PA-C  VITAMIN D, ERGOCALCIFEROL, PO Take 1,000 Units by mouth daily.     [provider]    Family History Family History  Problem Relation Age of Onset   Cancer Mother        ovarian   Breast cancer Mother    Diabetes Father    Kidney disease Father     Social History Social History   Tobacco Use   Smoking status: Former Smoker    Packs/day: 0.10    Years: 1.00    Pack years: 0.10    Types: Cigarettes    Quit date: 09/28/1950    Years since quitting: 68.8   Smokeless tobacco: Never Used  Substance Use Topics   Alcohol use: No   Drug use: No     Allergies   Bee venom and Sulfa antibiotics   Review of Systems Review of Systems  HENT: Positive for nosebleeds. Negative for sinus pain.   Eyes: Negative for photophobia and visual disturbance.  Respiratory: Negative for shortness of breath.   Cardiovascular: Negative for chest pain and palpitations.    Gastrointestinal: Negative for nausea and vomiting.  Musculoskeletal: Positive for arthralgias and myalgias. Negative for back pain, neck pain and neck stiffness.  Skin: Positive for wound. Negative for rash.  Neurological: Positive for headaches. Negative for dizziness, seizures, syncope and light-headedness.  Psychiatric/Behavioral: Negative for agitation and confusion.     Physical Exam Updated Vital Signs BP (!) 154/71    Pulse 77    Temp 97.8 F (36.6 C) (Oral)    Resp 15  Ht 5\' 3"  (1.6 m)    Wt 67.3 kg    SpO2 97%    BMI 26.27 kg/m   Physical Exam Vitals signs and nursing note reviewed.  Constitutional:      General: She is not in acute distress.    Appearance: She is well-developed.  HENT:     Head: Normocephalic. Abrasion present. No raccoon eyes, Battle's sign, masses, right periorbital erythema or left periorbital erythema.     Right Ear: Hearing and tympanic membrane normal.     Left Ear: Hearing and tympanic membrane normal.     Nose:     Comments: Swelling over bridge of nose with tenderness No significant deformity Able to breathe out bilateral nares Dried blood in R nares, no active nasal bleeding    Mouth/Throat:     Comments: No broken teeth Eyes:     Conjunctiva/sclera: Conjunctivae normal.  Neck:     Musculoskeletal: Neck supple.  Cardiovascular:     Rate and Rhythm: Normal rate and regular rhythm.     Pulses: Normal pulses.  Pulmonary:     Effort: Pulmonary effort is normal. No respiratory distress.  Abdominal:     Palpations: Abdomen is soft.     Tenderness: There is no abdominal tenderness.  Musculoskeletal: Normal range of motion.        General: No swelling or deformity.     Comments: Superficial abrasion to right knee with mild ttp, no significant effusion  Skin:    General: Skin is warm and dry.  Neurological:     General: No focal deficit present.     Mental Status: She is alert and oriented to person, place, and time.     Sensory: No  sensory deficit.     Motor: No weakness.     Comments: Mild lower C-spine tenderness in midline, no stepoffs, no other spinal midline tenderness  Psychiatric:        Mood and Affect: Mood normal.        Behavior: Behavior normal.      ED Treatments / Results  Labs (all labs ordered are listed, but only abnormal results are displayed) Labs Reviewed - No data to display  EKG None  Radiology Dg Knee 2 Views Right  Result Date: 06/30/2019 CLINICAL DATA:  Right knee pain after fall today. History of knee surgery in 2019. EXAM: RIGHT KNEE - 1-2 VIEW COMPARISON:  Plain films right knee 06/10/2015. FINDINGS: There is no acute bony or joint abnormality. The patient is status post medial compartment arthroplasty without evidence of complication. Patellofemoral degenerative change noted. Soft tissues appear normal. No joint effusion. IMPRESSION: No acute abnormality. Status post medial compartment arthroplasty. Patellofemoral osteoarthritis. Electronically Signed   By: Inge Rise M.D.   On: 06/30/2019 12:31   Ct Head Wo Contrast  Result Date: 06/30/2019 CLINICAL DATA:  Patient fell and hit therefore head and face. EXAM: CT HEAD WITHOUT CONTRAST CT CERVICAL SPINE WITHOUT CONTRAST TECHNIQUE: Multidetector CT imaging of the head and cervical spine was performed following the standard protocol without intravenous contrast. Multiplanar CT image reconstructions of the cervical spine were also generated. COMPARISON:  CT head and cervical spine dated 06/10/2015. FINDINGS: CT HEAD FINDINGS Brain: No evidence of acute infarction, hemorrhage, hydrocephalus, extra-axial collection or mass lesion/mass effect. There is mild cerebral volume loss with associated ex vacuo dilatation. Vascular: There are vascular calcifications in the carotid siphons. Skull: Normal. Negative for fracture or focal lesion. Sinuses/Orbits: There is mild left ethmoid sinus disease. Other:  None. CT CERVICAL SPINE FINDINGS Alignment:  Normal. Skull base and vertebrae: No acute fracture. No primary bone lesion or focal pathologic process. Soft tissues and spinal canal: No prevertebral fluid or swelling. No visible canal hematoma. Disc levels: Multilevel degenerative disc and joint disease is noted. Upper chest: Negative. Other: A left thyroid nodule measuring 2.4 x 1.7 cm in the axial plane and is not significantly changed since 06/10/2015. IMPRESSION: No acute intracranial process. No acute osseous injury in the cervical spine. Electronically Signed   By: Zerita Boers M.D.   On: 06/30/2019 12:37   Ct Cervical Spine Wo Contrast  Result Date: 06/30/2019 CLINICAL DATA:  Patient fell and hit therefore head and face. EXAM: CT HEAD WITHOUT CONTRAST CT CERVICAL SPINE WITHOUT CONTRAST TECHNIQUE: Multidetector CT imaging of the head and cervical spine was performed following the standard protocol without intravenous contrast. Multiplanar CT image reconstructions of the cervical spine were also generated. COMPARISON:  CT head and cervical spine dated 06/10/2015. FINDINGS: CT HEAD FINDINGS Brain: No evidence of acute infarction, hemorrhage, hydrocephalus, extra-axial collection or mass lesion/mass effect. There is mild cerebral volume loss with associated ex vacuo dilatation. Vascular: There are vascular calcifications in the carotid siphons. Skull: Normal. Negative for fracture or focal lesion. Sinuses/Orbits: There is mild left ethmoid sinus disease. Other: None. CT CERVICAL SPINE FINDINGS Alignment: Normal. Skull base and vertebrae: No acute fracture. No primary bone lesion or focal pathologic process. Soft tissues and spinal canal: No prevertebral fluid or swelling. No visible canal hematoma. Disc levels: Multilevel degenerative disc and joint disease is noted. Upper chest: Negative. Other: A left thyroid nodule measuring 2.4 x 1.7 cm in the axial plane and is not significantly changed since 06/10/2015. IMPRESSION: No acute intracranial process. No  acute osseous injury in the cervical spine. Electronically Signed   By: Zerita Boers M.D.   On: 06/30/2019 12:37    Procedures Procedures (including critical care time)  Medications Ordered in ED Medications - No data to display   Initial Impression / Assessment and Plan / ED Course  I have reviewed the triage vital signs and the nursing notes.  Pertinent labs & imaging results that were available during my care of the patient were reviewed by me and considered in my medical decision making (see chart for details).   85 year old female with a very pleasant demeanor presenting for mechanical fall this morning in her driveway.  She has an abrasion to the bridge of her nose with some swelling, no evidence of septal hematoma nasal exam, she is able to breathe her bilateral nares.  Given her age obtain a CT scan of her brain and C-spine.  She is some very mild lower C-spine tenderness.  Unclear if this is new or old pain, but will check CT.  Also obtain an x-ray of the right knee.  Her tetanus is up-to-date.  If imaging is negative, we can discharge her home.  Her daughter is at bedside.   Final Clinical Impressions(s) / ED Diagnoses   Final diagnoses:  Fall, initial encounter  Injury of nose, initial encounter  Abrasion of right knee, initial encounter    ED Discharge Orders    None       Earline Stiner, Carola Rhine, MD 06/30/19 1816

## 2019-06-30 NOTE — ED Notes (Signed)
ED Provider at bedside. 

## 2019-06-30 NOTE — Discharge Instructions (Addendum)
Your CT scan of the brain did not show signs of brain bleed or large fracture.  You may still have a small broken nose or fracture we could not see.  Please read over the instructions attached.  Your upper neck CT scan did not show any broken bones.  Your xray of the knee did not show broken bones.  Apply ice and take tylenol as needed for pain for the next several days.  Take your time standing and getting around the house.  Use a walker or cane if available.  You may continue to feel dizzy or somewhat lightheaded for a few days.

## 2019-06-30 NOTE — ED Notes (Signed)
Pt verbalized understanding of dc instructions.

## 2019-07-03 ENCOUNTER — Other Ambulatory Visit: Payer: Self-pay | Admitting: General Surgery

## 2019-07-03 DIAGNOSIS — Z853 Personal history of malignant neoplasm of breast: Secondary | ICD-10-CM

## 2019-07-13 ENCOUNTER — Other Ambulatory Visit: Payer: Self-pay

## 2019-07-13 ENCOUNTER — Ambulatory Visit
Admission: RE | Admit: 2019-07-13 | Discharge: 2019-07-13 | Disposition: A | Discharge status: Home / Self Care | Payer: Medicare PPO | Source: Ambulatory Visit | Attending: General Surgery | Admitting: General Surgery

## 2019-07-13 DIAGNOSIS — Z853 Personal history of malignant neoplasm of breast: Secondary | ICD-10-CM

## 2019-07-13 DIAGNOSIS — R928 Other abnormal and inconclusive findings on diagnostic imaging of breast: Secondary | ICD-10-CM | POA: Diagnosis not present

## 2019-07-19 DIAGNOSIS — M9903 Segmental and somatic dysfunction of lumbar region: Secondary | ICD-10-CM | POA: Diagnosis not present

## 2019-07-19 DIAGNOSIS — M9901 Segmental and somatic dysfunction of cervical region: Secondary | ICD-10-CM | POA: Diagnosis not present

## 2019-07-19 DIAGNOSIS — M5117 Intervertebral disc disorders with radiculopathy, lumbosacral region: Secondary | ICD-10-CM | POA: Diagnosis not present

## 2019-07-19 DIAGNOSIS — M62830 Muscle spasm of back: Secondary | ICD-10-CM | POA: Diagnosis not present

## 2019-07-19 DIAGNOSIS — M25552 Pain in left hip: Secondary | ICD-10-CM | POA: Diagnosis not present

## 2019-07-19 DIAGNOSIS — M9902 Segmental and somatic dysfunction of thoracic region: Secondary | ICD-10-CM | POA: Diagnosis not present

## 2019-07-26 DIAGNOSIS — M62830 Muscle spasm of back: Secondary | ICD-10-CM | POA: Diagnosis not present

## 2019-07-26 DIAGNOSIS — M9902 Segmental and somatic dysfunction of thoracic region: Secondary | ICD-10-CM | POA: Diagnosis not present

## 2019-07-26 DIAGNOSIS — M9903 Segmental and somatic dysfunction of lumbar region: Secondary | ICD-10-CM | POA: Diagnosis not present

## 2019-07-26 DIAGNOSIS — M5117 Intervertebral disc disorders with radiculopathy, lumbosacral region: Secondary | ICD-10-CM | POA: Diagnosis not present

## 2019-07-26 DIAGNOSIS — M25552 Pain in left hip: Secondary | ICD-10-CM | POA: Diagnosis not present

## 2019-08-02 DIAGNOSIS — M9902 Segmental and somatic dysfunction of thoracic region: Secondary | ICD-10-CM | POA: Diagnosis not present

## 2019-08-02 DIAGNOSIS — M5117 Intervertebral disc disorders with radiculopathy, lumbosacral region: Secondary | ICD-10-CM | POA: Diagnosis not present

## 2019-08-02 DIAGNOSIS — M9903 Segmental and somatic dysfunction of lumbar region: Secondary | ICD-10-CM | POA: Diagnosis not present

## 2019-08-02 DIAGNOSIS — M25552 Pain in left hip: Secondary | ICD-10-CM | POA: Diagnosis not present

## 2019-08-02 DIAGNOSIS — M62830 Muscle spasm of back: Secondary | ICD-10-CM | POA: Diagnosis not present

## 2019-08-09 DIAGNOSIS — M9902 Segmental and somatic dysfunction of thoracic region: Secondary | ICD-10-CM | POA: Diagnosis not present

## 2019-08-09 DIAGNOSIS — M5117 Intervertebral disc disorders with radiculopathy, lumbosacral region: Secondary | ICD-10-CM | POA: Diagnosis not present

## 2019-08-09 DIAGNOSIS — M9903 Segmental and somatic dysfunction of lumbar region: Secondary | ICD-10-CM | POA: Diagnosis not present

## 2019-08-09 DIAGNOSIS — M25552 Pain in left hip: Secondary | ICD-10-CM | POA: Diagnosis not present

## 2019-08-09 DIAGNOSIS — M62830 Muscle spasm of back: Secondary | ICD-10-CM | POA: Diagnosis not present

## 2019-08-26 IMAGING — MG DIGITAL DIAGNOSTIC BILATERAL MAMMOGRAM WITH TOMO AND CAD
6 of 9 series · 6 of 25 positions shown · non-contrast
Comparison: Previous exam(s).

CLINICAL DATA: [AGE] female presenting for routine annual
surveillance status post right breast lumpectomy in 1234.

EXAM:
DIGITAL DIAGNOSTIC BILATERAL MAMMOGRAM WITH CAD AND TOMO

[R CC]
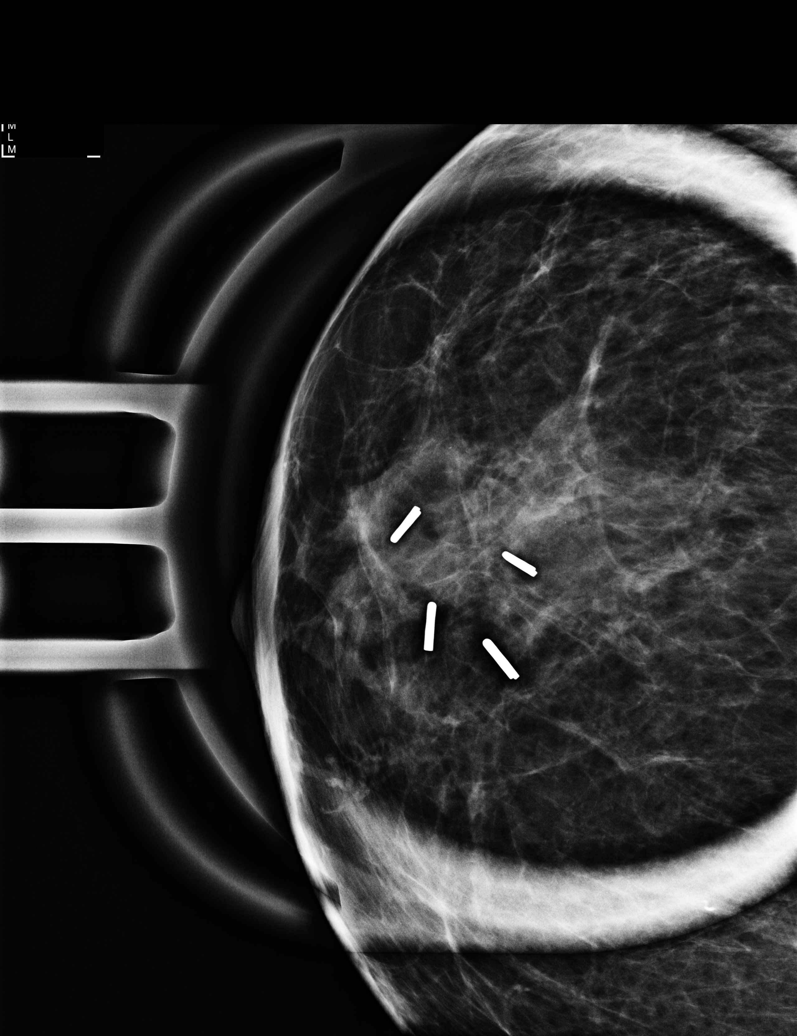

[L MLO synth-2D]
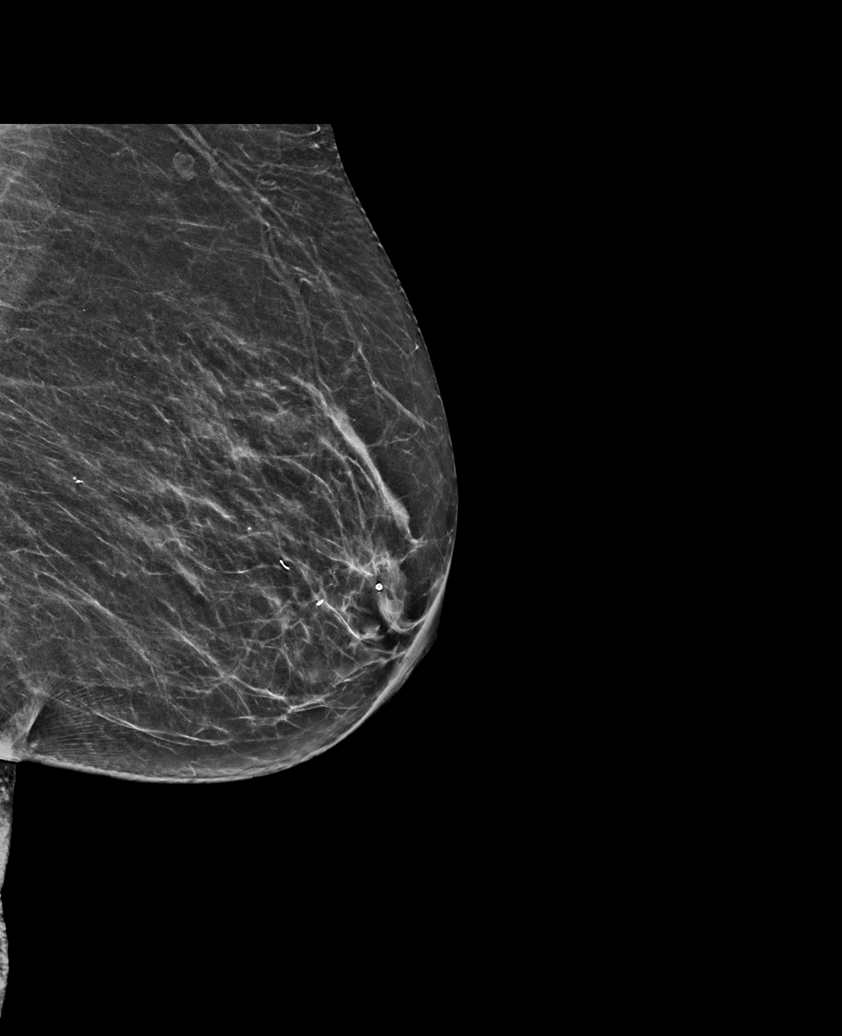

[R CC synth-2D]
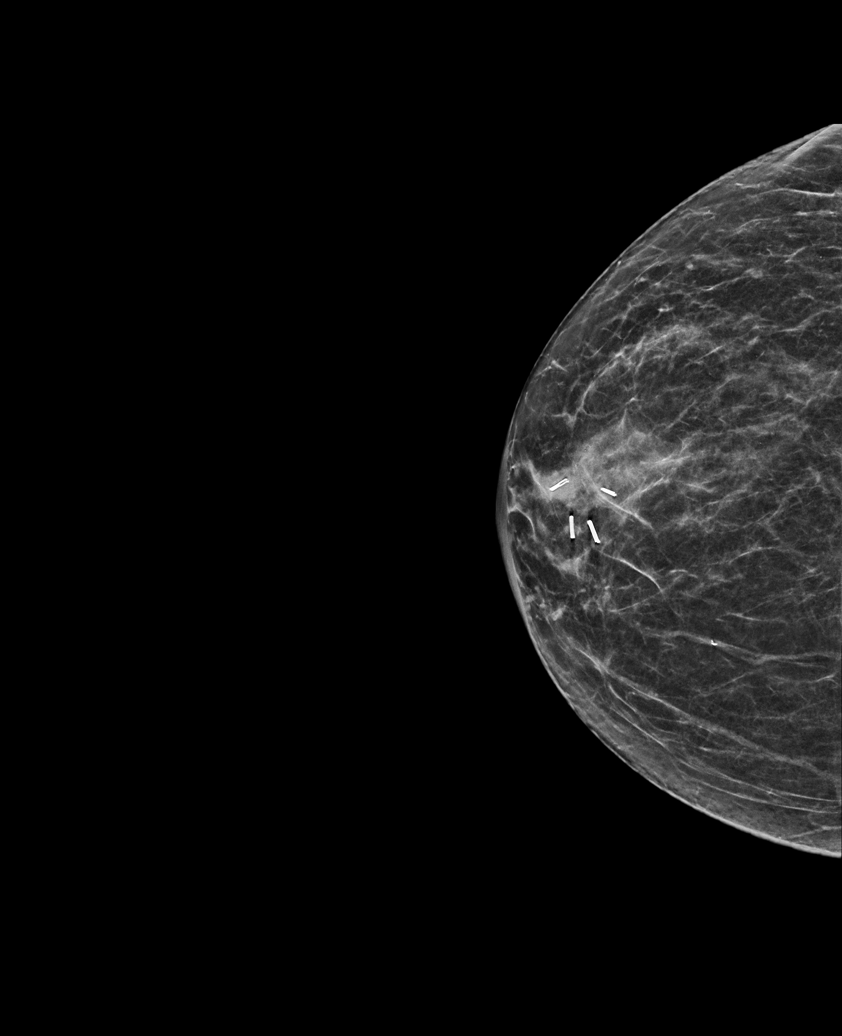

[L CC synth-2D]
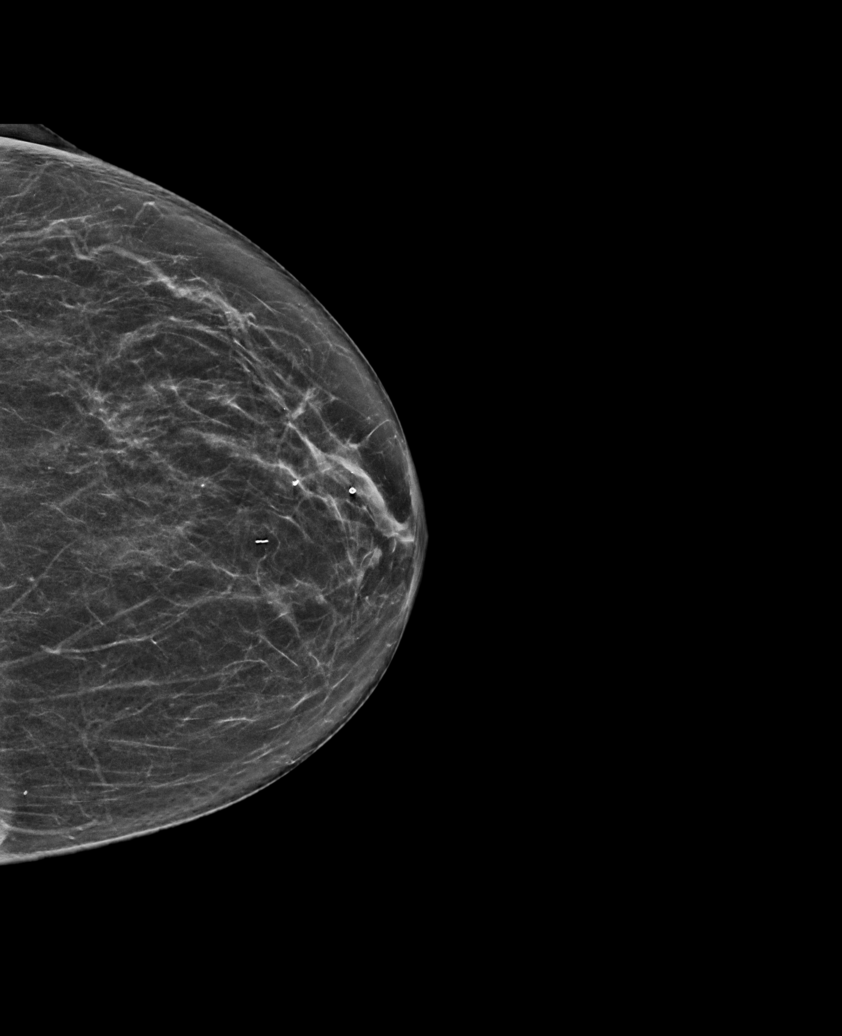

[R MLO synth-2D]
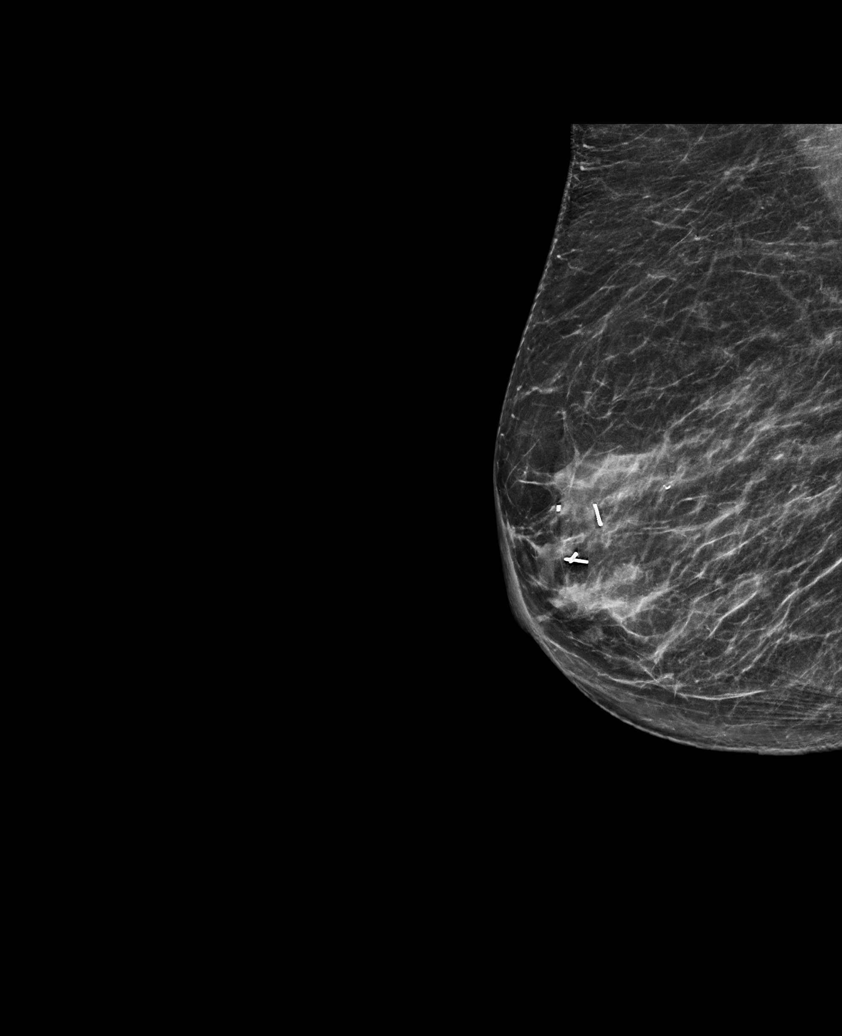

[L CC tomo · tomo slice 31/61.0]
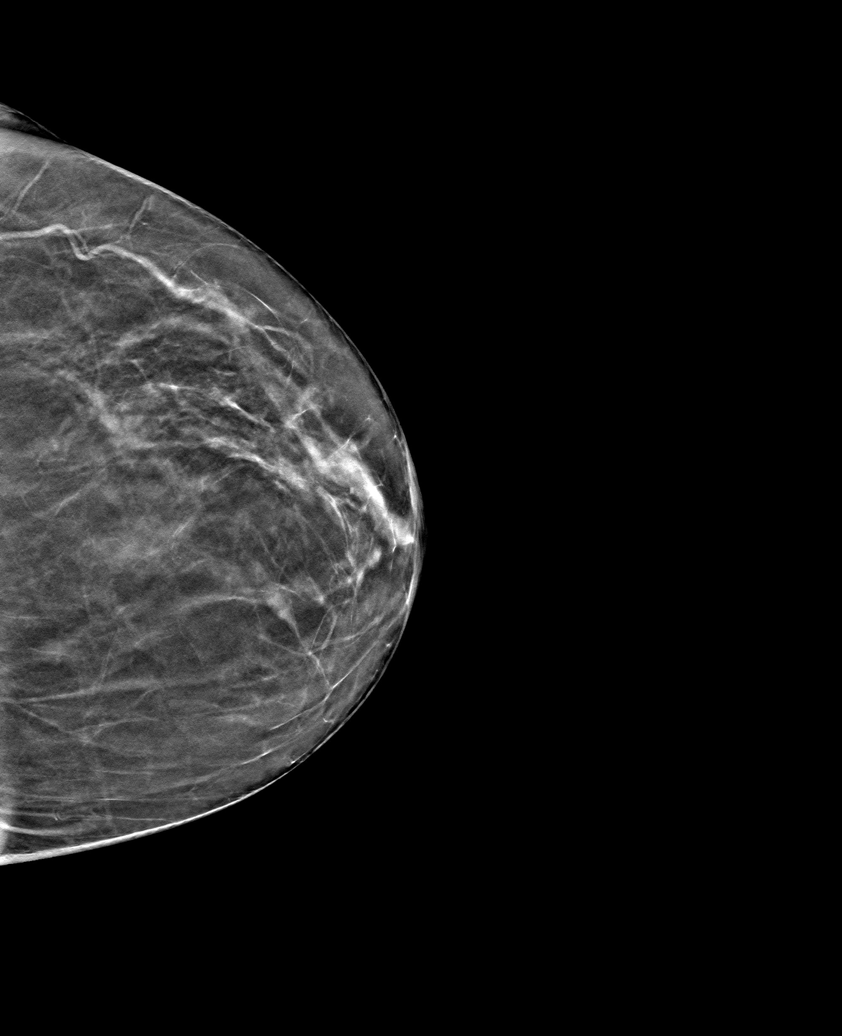

[6 of 25 positions shown; findings below may reference images not displayed]

ACR Breast Density Category b: There are scattered areas of
fibroglandular density.
FINDINGS: Interval surgical changes in the superior anterior right breast
consistent with history of lumpectomy. No suspicious calcifications,
masses or areas of distortion are seen in the bilateral breasts.

Mammographic images were processed with CAD.
IMPRESSION: Surgical changes in the anterior right breast are consistent with
lumpectomy. No mammographic evidence of malignancy in the bilateral
breasts.

RECOMMENDATION:
Diagnostic mammogram is suggested in 1 year. (Code:VK-3-OTT)

I have discussed the findings and recommendations with the patient.
Results were also provided in writing at the conclusion of the
visit. If applicable, a reminder letter will be sent to the patient
regarding the next appointment.

BI-RADS CATEGORY  2: Benign.

## 2019-08-30 DIAGNOSIS — M9903 Segmental and somatic dysfunction of lumbar region: Secondary | ICD-10-CM | POA: Diagnosis not present

## 2019-08-30 DIAGNOSIS — M9902 Segmental and somatic dysfunction of thoracic region: Secondary | ICD-10-CM | POA: Diagnosis not present

## 2019-08-30 DIAGNOSIS — M5117 Intervertebral disc disorders with radiculopathy, lumbosacral region: Secondary | ICD-10-CM | POA: Diagnosis not present

## 2019-08-30 DIAGNOSIS — M25552 Pain in left hip: Secondary | ICD-10-CM | POA: Diagnosis not present

## 2019-08-30 DIAGNOSIS — M62830 Muscle spasm of back: Secondary | ICD-10-CM | POA: Diagnosis not present

## 2019-09-06 DIAGNOSIS — M62830 Muscle spasm of back: Secondary | ICD-10-CM | POA: Diagnosis not present

## 2019-09-06 DIAGNOSIS — M25552 Pain in left hip: Secondary | ICD-10-CM | POA: Diagnosis not present

## 2019-09-06 DIAGNOSIS — M9903 Segmental and somatic dysfunction of lumbar region: Secondary | ICD-10-CM | POA: Diagnosis not present

## 2019-09-06 DIAGNOSIS — M5117 Intervertebral disc disorders with radiculopathy, lumbosacral region: Secondary | ICD-10-CM | POA: Diagnosis not present

## 2019-09-06 DIAGNOSIS — M9902 Segmental and somatic dysfunction of thoracic region: Secondary | ICD-10-CM | POA: Diagnosis not present

## 2019-09-27 DIAGNOSIS — M9902 Segmental and somatic dysfunction of thoracic region: Secondary | ICD-10-CM | POA: Diagnosis not present

## 2019-09-27 DIAGNOSIS — M25552 Pain in left hip: Secondary | ICD-10-CM | POA: Diagnosis not present

## 2019-09-27 DIAGNOSIS — M9903 Segmental and somatic dysfunction of lumbar region: Secondary | ICD-10-CM | POA: Diagnosis not present

## 2019-09-27 DIAGNOSIS — M62830 Muscle spasm of back: Secondary | ICD-10-CM | POA: Diagnosis not present

## 2019-09-27 DIAGNOSIS — M5117 Intervertebral disc disorders with radiculopathy, lumbosacral region: Secondary | ICD-10-CM | POA: Diagnosis not present

## 2019-11-06 DIAGNOSIS — Q809 Congenital ichthyosis, unspecified: Secondary | ICD-10-CM | POA: Diagnosis not present

## 2019-11-06 DIAGNOSIS — D2330 Other benign neoplasm of skin of unspecified part of face: Secondary | ICD-10-CM | POA: Diagnosis not present

## 2019-11-06 DIAGNOSIS — L708 Other acne: Secondary | ICD-10-CM | POA: Diagnosis not present

## 2019-11-06 DIAGNOSIS — D1801 Hemangioma of skin and subcutaneous tissue: Secondary | ICD-10-CM | POA: Diagnosis not present

## 2019-11-06 DIAGNOSIS — D229 Melanocytic nevi, unspecified: Secondary | ICD-10-CM | POA: Diagnosis not present

## 2019-11-28 DIAGNOSIS — Z961 Presence of intraocular lens: Secondary | ICD-10-CM | POA: Diagnosis not present

## 2019-11-28 DIAGNOSIS — H16223 Keratoconjunctivitis sicca, not specified as Sjogren's, bilateral: Secondary | ICD-10-CM | POA: Diagnosis not present

## 2019-11-28 DIAGNOSIS — Z7984 Long term (current) use of oral hypoglycemic drugs: Secondary | ICD-10-CM | POA: Diagnosis not present

## 2019-11-28 DIAGNOSIS — H43813 Vitreous degeneration, bilateral: Secondary | ICD-10-CM | POA: Diagnosis not present

## 2019-11-28 DIAGNOSIS — E119 Type 2 diabetes mellitus without complications: Secondary | ICD-10-CM | POA: Diagnosis not present

## 2019-11-28 DIAGNOSIS — H40052 Ocular hypertension, left eye: Secondary | ICD-10-CM | POA: Diagnosis not present

## 2019-11-28 DIAGNOSIS — Z83511 Family history of glaucoma: Secondary | ICD-10-CM | POA: Diagnosis not present

## 2020-02-05 DIAGNOSIS — H524 Presbyopia: Secondary | ICD-10-CM | POA: Diagnosis not present

## 2020-02-05 DIAGNOSIS — H5319 Other subjective visual disturbances: Secondary | ICD-10-CM | POA: Diagnosis not present

## 2020-02-05 DIAGNOSIS — H16223 Keratoconjunctivitis sicca, not specified as Sjogren's, bilateral: Secondary | ICD-10-CM | POA: Diagnosis not present

## 2020-02-05 DIAGNOSIS — H5203 Hypermetropia, bilateral: Secondary | ICD-10-CM | POA: Diagnosis not present

## 2020-02-05 DIAGNOSIS — H52203 Unspecified astigmatism, bilateral: Secondary | ICD-10-CM | POA: Diagnosis not present

## 2020-02-05 DIAGNOSIS — H35371 Puckering of macula, right eye: Secondary | ICD-10-CM | POA: Diagnosis not present

## 2020-02-05 DIAGNOSIS — H43813 Vitreous degeneration, bilateral: Secondary | ICD-10-CM | POA: Diagnosis not present

## 2020-03-13 DIAGNOSIS — B029 Zoster without complications: Secondary | ICD-10-CM | POA: Diagnosis not present

## 2020-03-13 DIAGNOSIS — L821 Other seborrheic keratosis: Secondary | ICD-10-CM | POA: Diagnosis not present

## 2020-04-26 DIAGNOSIS — H35371 Puckering of macula, right eye: Secondary | ICD-10-CM | POA: Diagnosis not present

## 2020-04-26 DIAGNOSIS — Z961 Presence of intraocular lens: Secondary | ICD-10-CM | POA: Diagnosis not present

## 2020-04-26 DIAGNOSIS — H26492 Other secondary cataract, left eye: Secondary | ICD-10-CM | POA: Diagnosis not present

## 2020-05-22 DIAGNOSIS — E119 Type 2 diabetes mellitus without complications: Secondary | ICD-10-CM | POA: Diagnosis not present

## 2020-05-22 DIAGNOSIS — H35363 Drusen (degenerative) of macula, bilateral: Secondary | ICD-10-CM | POA: Diagnosis not present

## 2020-05-22 DIAGNOSIS — H43813 Vitreous degeneration, bilateral: Secondary | ICD-10-CM | POA: Diagnosis not present

## 2020-05-22 DIAGNOSIS — Z961 Presence of intraocular lens: Secondary | ICD-10-CM | POA: Diagnosis not present

## 2020-05-22 DIAGNOSIS — H3554 Dystrophies primarily involving the retinal pigment epithelium: Secondary | ICD-10-CM | POA: Diagnosis not present

## 2020-05-22 DIAGNOSIS — H35341 Macular cyst, hole, or pseudohole, right eye: Secondary | ICD-10-CM | POA: Diagnosis not present

## 2020-05-22 DIAGNOSIS — H35371 Puckering of macula, right eye: Secondary | ICD-10-CM | POA: Diagnosis not present

## 2020-05-22 DIAGNOSIS — H26492 Other secondary cataract, left eye: Secondary | ICD-10-CM | POA: Diagnosis not present

## 2020-05-30 DIAGNOSIS — H26492 Other secondary cataract, left eye: Secondary | ICD-10-CM | POA: Diagnosis not present

## 2020-05-30 DIAGNOSIS — Z961 Presence of intraocular lens: Secondary | ICD-10-CM | POA: Diagnosis not present

## 2020-05-30 DIAGNOSIS — H02055 Trichiasis without entropian left lower eyelid: Secondary | ICD-10-CM | POA: Diagnosis not present

## 2020-05-30 DIAGNOSIS — H40052 Ocular hypertension, left eye: Secondary | ICD-10-CM | POA: Diagnosis not present

## 2020-06-12 ENCOUNTER — Other Ambulatory Visit: Payer: Self-pay | Admitting: General Surgery

## 2020-06-12 DIAGNOSIS — Z0000 Encounter for general adult medical examination without abnormal findings: Secondary | ICD-10-CM

## 2020-06-21 DIAGNOSIS — Z23 Encounter for immunization: Secondary | ICD-10-CM | POA: Diagnosis not present

## 2020-06-21 DIAGNOSIS — M7989 Other specified soft tissue disorders: Secondary | ICD-10-CM | POA: Diagnosis not present

## 2020-06-21 DIAGNOSIS — C50411 Malignant neoplasm of upper-outer quadrant of right female breast: Secondary | ICD-10-CM | POA: Diagnosis not present

## 2020-06-21 DIAGNOSIS — E559 Vitamin D deficiency, unspecified: Secondary | ICD-10-CM | POA: Diagnosis not present

## 2020-06-21 DIAGNOSIS — Z1159 Encounter for screening for other viral diseases: Secondary | ICD-10-CM | POA: Diagnosis not present

## 2020-06-21 DIAGNOSIS — E785 Hyperlipidemia, unspecified: Secondary | ICD-10-CM | POA: Diagnosis not present

## 2020-06-21 DIAGNOSIS — I10 Essential (primary) hypertension: Secondary | ICD-10-CM | POA: Diagnosis not present

## 2020-06-21 DIAGNOSIS — E119 Type 2 diabetes mellitus without complications: Secondary | ICD-10-CM | POA: Diagnosis not present

## 2020-06-21 DIAGNOSIS — Z7689 Persons encountering health services in other specified circumstances: Secondary | ICD-10-CM | POA: Diagnosis not present

## 2020-06-21 DIAGNOSIS — Z170 Estrogen receptor positive status [ER+]: Secondary | ICD-10-CM | POA: Diagnosis not present

## 2020-07-16 ENCOUNTER — Other Ambulatory Visit: Payer: Self-pay | Admitting: General Surgery

## 2020-07-16 ENCOUNTER — Other Ambulatory Visit: Payer: Self-pay

## 2020-07-16 ENCOUNTER — Ambulatory Visit
Admission: RE | Admit: 2020-07-16 | Discharge: 2020-07-16 | Disposition: A | Discharge status: Home / Self Care | Payer: Medicare PPO | Source: Ambulatory Visit | Attending: General Surgery | Admitting: General Surgery

## 2020-07-16 DIAGNOSIS — R928 Other abnormal and inconclusive findings on diagnostic imaging of breast: Secondary | ICD-10-CM | POA: Diagnosis not present

## 2020-07-16 DIAGNOSIS — Z853 Personal history of malignant neoplasm of breast: Secondary | ICD-10-CM

## 2020-07-16 DIAGNOSIS — Z0000 Encounter for general adult medical examination without abnormal findings: Secondary | ICD-10-CM

## 2020-08-08 DIAGNOSIS — E785 Hyperlipidemia, unspecified: Secondary | ICD-10-CM | POA: Diagnosis not present

## 2020-08-08 DIAGNOSIS — Z8639 Personal history of other endocrine, nutritional and metabolic disease: Secondary | ICD-10-CM | POA: Diagnosis not present

## 2020-08-08 DIAGNOSIS — E559 Vitamin D deficiency, unspecified: Secondary | ICD-10-CM | POA: Diagnosis not present

## 2020-08-13 DIAGNOSIS — H6121 Impacted cerumen, right ear: Secondary | ICD-10-CM | POA: Diagnosis not present

## 2020-08-13 DIAGNOSIS — E119 Type 2 diabetes mellitus without complications: Secondary | ICD-10-CM | POA: Diagnosis not present

## 2020-08-13 DIAGNOSIS — I129 Hypertensive chronic kidney disease with stage 1 through stage 4 chronic kidney disease, or unspecified chronic kidney disease: Secondary | ICD-10-CM | POA: Diagnosis not present

## 2020-08-13 DIAGNOSIS — E871 Hypo-osmolality and hyponatremia: Secondary | ICD-10-CM | POA: Diagnosis not present

## 2020-08-13 DIAGNOSIS — E7849 Other hyperlipidemia: Secondary | ICD-10-CM | POA: Diagnosis not present

## 2020-08-13 DIAGNOSIS — N1831 Chronic kidney disease, stage 3a: Secondary | ICD-10-CM | POA: Diagnosis not present

## 2020-08-13 DIAGNOSIS — Z0000 Encounter for general adult medical examination without abnormal findings: Secondary | ICD-10-CM | POA: Diagnosis not present

## 2020-08-13 DIAGNOSIS — R065 Mouth breathing: Secondary | ICD-10-CM | POA: Diagnosis not present

## 2020-08-13 DIAGNOSIS — M1711 Unilateral primary osteoarthritis, right knee: Secondary | ICD-10-CM | POA: Diagnosis not present

## 2020-09-11 ENCOUNTER — Ambulatory Visit: Payer: Medicare PPO | Attending: Internal Medicine | Admitting: Physical Therapy

## 2020-09-11 ENCOUNTER — Encounter: Payer: Self-pay | Admitting: Physical Therapy

## 2020-09-11 ENCOUNTER — Other Ambulatory Visit: Payer: Self-pay

## 2020-09-11 DIAGNOSIS — R262 Difficulty in walking, not elsewhere classified: Secondary | ICD-10-CM | POA: Diagnosis not present

## 2020-09-11 DIAGNOSIS — M6281 Muscle weakness (generalized): Secondary | ICD-10-CM | POA: Diagnosis not present

## 2020-09-11 DIAGNOSIS — R2689 Other abnormalities of gait and mobility: Secondary | ICD-10-CM | POA: Diagnosis not present

## 2020-09-11 NOTE — Patient Instructions (Signed)
Access Code: [email or web address] URL: [email or web address] Date: 09/11/2020 Prepared by: Amador Cunas  Exercises Sit to Stand with Hands on Knees - 1 x daily - 7 x weekly - 3 sets - 10 reps Seated March - 1 x daily - 7 x weekly - 3 sets - 10 reps Seated Long Arc Quad - 1 x daily - 7 x weekly - 3 sets - 10 reps Seated Hip Adduction Isometrics with Ball - 1 x daily - 7 x weekly - 3 sets - 10 reps

## 2020-09-11 NOTE — Therapy (Signed)
[facility] [place], [place], [address] Phone: [phone]   Fax:  [phone]  Physical Therapy Evaluation  Patient Details  Name: Kristen Hill MRN: [id number] Date of Birth: [birth year] Referring Provider (PT): Dorina Hoyer Date: 09/11/2020   PT End of Session - 09/11/20 1438    Visit Number 1    Date for PT Re-Evaluation 11/12/20    PT Start Time 9702    PT Stop Time 1434    PT Time Calculation (min) 37 min    Activity Tolerance Patient tolerated treatment well    Behavior During Therapy [facility] for tasks assessed/performed           Past Medical History:  Diagnosis Date  . Aortic atherosclerosis ([place])   . Cancer [facility])    right breast  . Diabetes mellitus without complication ([place]) 6378   type 2  . Glaucoma    left eye  . Hematuria 10/31/2013  . History of external beam radiation therapy 07/21/17- 08/11/17   Right Breast 2.66 Gy X 16 fractions  . Hypertension   . Jaundice    in 1947  . Jaundice 1946  . Measles as a child  . Multinodular goiter   . Mumps as a child  . OA (osteoarthritis)    right knee, torn meniscus lateral and medial  . Other and unspecified hyperlipidemia 07/31/2013  . Personal history of radiation therapy   . Physical exam, annual 08/02/2013   Sees Dr Trinna Post at [facility] Dr Farris Has, Retinal specialist Dermatolgy  . Thyroid nodule   . Wears glasses   . Wears partial dentures     Past Surgical History:  Procedure Laterality Date  . BREAST LUMPECTOMY Right 06/14/2017  . BREAST LUMPECTOMY WITH RADIOACTIVE SEED LOCALIZATION Right 06/14/2017   Procedure: [place] BREAST LUMPECTOMY WITH RADIOACTIVE SEED LOCALIZATION ERAS PATHWAY;  Surgeon: Jovita Kussmaul, MD;  Location: [place];  Service: General;  Laterality: Right;  . COLONOSCOPY  09/2015  . EYE SURGERY  2009 and 2010   cataract both eyes  . EYE SURGERY  2012   laser to left eye, chalazion was removed with  laser  . KNEE SURGERY Right    medial meniscus and lateral menicus  . MULTIPLE TOOTH EXTRACTIONS    . PARTIAL KNEE ARTHROPLASTY Right 04/08/2018   Procedure: RIGHT UNICOMPARTMENTAL KNEE;  Surgeon: Dorna Leitz, MD;  Location: [facility];  Service: Orthopedics;  Laterality: Right;  . TONSILLECTOMY  86 yrs old    There were no vitals filed for this visit.    Subjective Assessment - 09/11/20 1402    Subjective Pt reports fall in Oct 2020 which resulted in ED visit. Pt did not have any fractures but was very bruised up and reports that she has been very fearful of falling and has had a decrease in activity. Pt ambulates into clinic with [place]. Pt reports that she has been feeling very off balance.    Pertinent History HTN, OA, DM    Limitations Standing;Walking    Patient Stated Goals get rid of SPC, be able to get up from recliner without holding on    Currently in Pain? No/denies              [facility] PT Assessment - 09/11/20 0001      Assessment   Medical Diagnosis Gait/Deconditioning    Referring Provider (PT) Wang    Prior Therapy PT for gait/balance      Precautions  Precautions None      Restrictions   Weight Bearing Restrictions No      Balance Screen   Has the patient fallen in the past 6 months No    Has the patient had a decrease in activity level because of a fear of falling?  No    Is the patient reluctant to leave their home because of a fear of falling?  No      Home Environment   Additional Comments 3 stairs with HR; goes slow but able to do it      Prior Function   Level of Independence Independent    Vocation Retired    Leisure Astronomer tests Sit to Stand      Sit to Stand   Comments added pillow to chair d/t difficulty with rising using UEs on thighs; visible shaking with exertion      Posture/Postural Control   Posture/Postural Control Postural limitations    Postural Limitations Rounded Shoulders;Forward head;Increased  thoracic kyphosis;Flexed trunk      ROM / Strength   AROM / PROM / Strength AROM;Strength      AROM   Overall AROM Comments lumbar AROM 50% limited      Strength   Strength Assessment Site Hip;Knee    Right/Left Hip Right;Left    Right Hip Flexion 4/5    Right Hip Extension 3+/5    Right Hip ABduction 3+/5    Left Hip Flexion 4-/5    Left Hip Extension 3+/5    Left Hip ABduction 3+/5    Right/Left Knee Right;Left    Right Knee Flexion 4+/5    Right Knee Extension 4+/5    Left Knee Flexion 4/5    Left Knee Extension 4/5      Flexibility   Soft Tissue Assessment /Muscle Length yes    Hamstrings mild tightness    Piriformis tightness; states she does stretches from last PT that help with this      Transfers   Five time sit to stand comments  unable to complete      Ambulation/Gait   Gait Pattern Decreased arm swing - left;Decreased step length - right;Decreased step length - left;Decreased hip/knee flexion - right;Decreased hip/knee flexion - left;Shuffle    Gait velocity slow gait with [facility]      Standardized Balance Assessment   Standardized Balance Assessment Berg Balance Test      Berg Balance Test   Sit to Stand Able to stand  independently using hands    Standing Unsupported Able to stand safely 2 minutes    Sitting with Back Unsupported but Feet Supported on Floor or Stool Able to sit safely and securely 2 minutes    Stand to Sit Controls descent by using hands    Transfers Able to transfer safely, definite need of hands    Standing Unsupported with Eyes Closed Able to stand 10 seconds safely    Standing Unsupported with Feet Together Able to place feet together independently but unable to hold for 30 seconds    From Standing, Reach Forward with Outstretched Arm Reaches forward but needs supervision    From Standing Position, Pick up Object from Floor Unable to pick up and needs supervision    From Standing Position, Turn to Look Behind Over each Shoulder Looks  behind one side only/other side shows less weight shift    Turn 360 Degrees Able to turn 360 degrees safely but slowly  Standing Unsupported, Alternately Place Feet on Step/Stool Needs assistance to keep from falling or unable to try    Standing Unsupported, One Foot in [place] to take small step independently and hold 30 seconds    Standing on One Leg Unable to try or needs assist to prevent fall    Total Score 32                      Objective measurements completed on examination: See above findings.       [facility] Adult PT Treatment/Exercise - 09/11/20 0001      Exercises   Exercises Knee/Hip      Knee/Hip Exercises: Seated   Long Arc Quad Both;1 set;10 reps    Ball Squeeze x10 3 sec hold    Marching Both;1 set;10 reps    Sit to [employer] 10 reps;with UE support                  PT Education - 09/11/20 1438    Education Details Pt educated on POC and HEP    Person(s) Educated Patient    Methods Explanation;Demonstration;Handout    Comprehension Verbalized understanding;Returned demonstration            PT Short Term Goals - 09/11/20 1443      PT SHORT TERM GOAL #1   Title independent with intiial HEP    Time 2    Status New    Target Date 09/25/20             PT Long Term Goals - 09/11/20 1443      PT LONG TERM GOAL #1   Title Pt will be I with advanced HEP    Time 8    Period Weeks    Status New    Target Date 11/06/20      PT LONG TERM GOAL #2   Title Pt will demo Berg Balance Score 45/56 in order to decrease risk of falls    Baseline 32/56    Time 8    Period Weeks    Status New    Target Date 11/06/20      PT LONG TERM GOAL #3   Title Pt will demo BLE hip flexion/extension strength 4+/5    Time 8    Period Weeks    Status New    Target Date 11/06/20      PT LONG TERM GOAL #4   Title Pt will report able to rise from recliner with minimal difficulty    Time 8    Period Weeks    Status New    Target Date 11/06/20       PT LONG TERM GOAL #5   Title walk without cane for most distances    Time 8    Period Weeks    Status New    Target Date 11/06/20                  Plan - 09/11/20 1439    Clinical Impression Statement Pt presents to clinic with reports of imbalance with gait and strength deficits following a fall in Oct 2020. Pt did not sustain fractures in the fall; however, since then she reports very fearful of falling with a decrease in activity level. Pt demos significant functional strength deficits, shuffling and forward flexed with gait with SPC, difficulty with rising from seated position, and high fall risk via Berg Balance score. Pt would like to be able to walk  without SPC, get up from recliner with less difficulty, and decrease fear of falling. Pt would benefit from skilled PT to address the above impairments.    Examination-Activity Limitations Locomotion Level;Stand;Stairs    Examination-Participation Restrictions Community Activity;Interpersonal Relationship    Stability/Clinical Decision Making Stable/Uncomplicated    Clinical Decision Making Low    Rehab Potential Good    PT Frequency 2x / week    PT Duration 8 weeks    PT Treatment/Interventions ADLs/Self Care Home Management;Electrical Stimulation;Moist Heat;Gait training;Stair training;Functional mobility training;Therapeutic activities;Therapeutic exercise;Balance training;Neuromuscular re-education;Manual techniques;Patient/family education    PT Next Visit Plan get pt moving, LE strengthening, functional strengthening, balance ex's    PT Home Exercise Plan STS, LAQ, ball squeeze, seated marches    Consulted and Agree with Plan of Care Patient           Patient will benefit from skilled therapeutic intervention in order to improve the following deficits and impairments:  Abnormal gait,Difficulty walking,Decreased endurance,Decreased activity tolerance,Decreased balance,Decreased mobility,Decreased strength,Postural  dysfunction  Visit Diagnosis: Muscle weakness (generalized)  Difficulty in walking, not elsewhere classified  Abnormality of gait due to impairment of balance     Problem List Patient Active Problem List   Diagnosis Date Noted  . Primary osteoarthritis of right knee 04/08/2018  . Malignant neoplasm of upper-outer quadrant of right breast in female, estrogen receptor positive ([place]) 06/29/2017  . Unspecified vitamin D deficiency 11/27/2013  . Hematuria 10/31/2013  . Physical exam, annual 08/02/2013  . Other and unspecified hyperlipidemia 07/31/2013  . Diabetes mellitus without complication ([place])   . Hypertension   . Arthritis    Amador Cunas, PT, DPT [facility] 09/11/2020, 2:46 PM  [facility]. [place], [place], [address] Phone: [phone]   Fax:  [phone]  Name: Kristen Hill MRN: [id number] Date of Birth: March 24, 1926

## 2020-09-16 ENCOUNTER — Ambulatory Visit: Payer: Medicare PPO | Admitting: Physical Therapy

## 2020-09-16 ENCOUNTER — Encounter: Payer: Self-pay | Admitting: Physical Therapy

## 2020-09-16 ENCOUNTER — Other Ambulatory Visit: Payer: Self-pay

## 2020-09-16 DIAGNOSIS — R2689 Other abnormalities of gait and mobility: Secondary | ICD-10-CM

## 2020-09-16 DIAGNOSIS — M6281 Muscle weakness (generalized): Secondary | ICD-10-CM

## 2020-09-16 DIAGNOSIS — R262 Difficulty in walking, not elsewhere classified: Secondary | ICD-10-CM

## 2020-09-16 NOTE — Therapy (Signed)
[facility]. [place], [place], [address] Phone: [phone]   Fax:  [phone]  Physical Therapy Treatment  Patient Details  Name: Kristen Hill MRN: [id number] Date of Birth: 01-May-1926 Referring Provider (PT): Dorina Hoyer Date: 09/16/2020   PT End of Session - 09/16/20 1443    Visit Number 2    Date for PT Re-Evaluation 11/12/20    PT Start Time 1359    PT Stop Time 1443    PT Time Calculation (min) 44 min    Activity Tolerance Patient tolerated treatment well    Behavior During Therapy [facility] for tasks assessed/performed           Past Medical History:  Diagnosis Date  . Aortic atherosclerosis ([place])   . Cancer [facility])    right breast  . Diabetes mellitus without complication ([place]) 2774   type 2  . Glaucoma    left eye  . Hematuria 10/31/2013  . History of external beam radiation therapy 07/21/17- 08/11/17   Right Breast 2.66 Gy X 16 fractions  . Hypertension   . Jaundice    in 1947  . Jaundice 1946  . Measles as a child  . Multinodular goiter   . Mumps as a child  . OA (osteoarthritis)    right knee, torn meniscus lateral and medial  . Other and unspecified hyperlipidemia 07/31/2013  . Personal history of radiation therapy   . Physical exam, annual 08/02/2013   Sees Dr Trinna Post at [facility] Dr Farris Has, Retinal specialist Dermatolgy  . Thyroid nodule   . Wears glasses   . Wears partial dentures     Past Surgical History:  Procedure Laterality Date  . BREAST LUMPECTOMY Right 06/14/2017  . BREAST LUMPECTOMY WITH RADIOACTIVE SEED LOCALIZATION Right 06/14/2017   Procedure: [place] BREAST LUMPECTOMY WITH RADIOACTIVE SEED LOCALIZATION ERAS PATHWAY;  Surgeon: Jovita Kussmaul, MD;  Location: [place];  Service: General;  Laterality: Right;  . COLONOSCOPY  09/2015  . EYE SURGERY  2009 and 2010   cataract both eyes  . EYE SURGERY  2012   laser to left eye, chalazion was removed with  laser  . KNEE SURGERY Right    medial meniscus and lateral menicus  . MULTIPLE TOOTH EXTRACTIONS    . PARTIAL KNEE ARTHROPLASTY Right 04/08/2018   Procedure: RIGHT UNICOMPARTMENTAL KNEE;  Surgeon: Dorna Leitz, MD;  [facility];  Service: Orthopedics;  Laterality: Right;  . TONSILLECTOMY  86 yrs old    There were no vitals filed for this visit.   Subjective Assessment - 09/16/20 1403    Subjective Pt reports doing well; states she has been doing her HEP    Currently in Pain? No/denies                             [facility] Adult PT Treatment/Exercise - 09/16/20 0001      High Level Balance   High Level Balance Activities Backward walking    High Level Balance Comments HH assist      Knee/Hip Exercises: Aerobic   Nustep L4 x 6 min      Knee/Hip Exercises: Standing   Heel Raises Both;1 set;10 reps    Heel Raises Limitations 2#    Knee Flexion Both;1 set;10 reps    Knee Flexion Limitations 2#    Hip Abduction Both;1 set;10 reps    Abduction Limitations 2#    Hip  Extension Both;1 set;10 reps    Extension Limitations 2#      Knee/Hip Exercises: Seated   Long Arc Quad Both;1 set;10 reps    Long Arc Quad Limitations 2#    Ball Squeeze x10 3 sec hold    Other Seated Knee/Hip Exercises yellow ball chest press 1x10    Marching Both;1 set;10 reps    Marching Limitations 2#    Hamstring Curl Both;1 set;10 reps    Hamstring Limitations green tb    Sit to [place] 3 sets;5 reps;without UE support   elevated mat table holding red ball                   PT Short Term Goals - 09/16/20 1447      PT SHORT TERM GOAL #1   Title independent with intiial HEP    Time 2    Status Achieved    Target Date 09/25/20             PT Long Term Goals - 09/11/20 1443      PT LONG TERM GOAL #1   Title Pt will be I with advanced HEP    Time 8    Period Weeks    Status New    Target Date 11/06/20      PT LONG TERM GOAL #2   Title Pt will demo Berg Balance  Score 45/56 in order to decrease risk of falls    Baseline 32/56    Time 8    Period Weeks    Status New    Target Date 11/06/20      PT LONG TERM GOAL #3   Title Pt will demo BLE hip flexion/extension strength 4+/5    Time 8    Period Weeks    Status New    Target Date 11/06/20      PT LONG TERM GOAL #4   Title Pt will report able to rise from recliner with minimal difficulty    Time 8    Period Weeks    Status New    Target Date 11/06/20      PT LONG TERM GOAL #5   Title walk without cane for most distances    Time 8    Period Weeks    Status New    Target Date 11/06/20                 Plan - 09/16/20 1443    Clinical Impression Statement Pt tolerated progression to TE well. Fwd/bkwd walking with hand held assist; pt had 2 instances of LOB with backwards walking corrected by PT. Cues for increased step length, increased foot clearance, and upright posture. Pt able to tolerate several standing ex's in a row before requiring a seated rest break. Continue to progress functional strength, balance, and endurance.    PT Treatment/Interventions ADLs/Self Care Home Management;Electrical Stimulation;Moist Heat;Gait training;Stair training;Functional mobility training;Therapeutic activities;Therapeutic exercise;Balance training;Neuromuscular re-education;Manual techniques;Patient/family education    PT Next Visit Plan get pt moving, LE strengthening, functional strengthening, balance ex's    Consulted and Agree with Plan of Care Patient           Patient will benefit from skilled therapeutic intervention in order to improve the following deficits and impairments:  Abnormal gait,Difficulty walking,Decreased endurance,Decreased activity tolerance,Decreased balance,Decreased mobility,Decreased strength,Postural dysfunction  Visit Diagnosis: Muscle weakness (generalized)  Difficulty in walking, not elsewhere classified  Abnormality of gait due to impairment of  balance     Problem List  Patient Active Problem List   Diagnosis Date Noted  . Primary osteoarthritis of right knee 04/08/2018  . Malignant neoplasm of upper-outer quadrant of right breast in female, estrogen receptor positive ([place]) 06/29/2017  . Unspecified vitamin D deficiency 11/27/2013  . Hematuria 10/31/2013  . Physical exam, annual 08/02/2013  . Other and unspecified hyperlipidemia 07/31/2013  . Diabetes mellitus without complication ([place])   . Hypertension   . Arthritis    Amador Cunas, PT, DPT [facility] Arlyn Bumpus 09/16/2020, 2:49 PM  [facility] [facility]. [place], [place], [address] Phone: [phone]   Fax:  [phone]  Name: Myeisha Kruser MRN: [id number] Date of Birth: 01-05-1926

## 2020-09-18 ENCOUNTER — Ambulatory Visit: Payer: Medicare PPO | Admitting: Physical Therapy

## 2020-09-18 ENCOUNTER — Encounter: Payer: Self-pay | Admitting: Physical Therapy

## 2020-09-18 ENCOUNTER — Other Ambulatory Visit: Payer: Self-pay

## 2020-09-18 DIAGNOSIS — M6281 Muscle weakness (generalized): Secondary | ICD-10-CM | POA: Diagnosis not present

## 2020-09-18 DIAGNOSIS — R2689 Other abnormalities of gait and mobility: Secondary | ICD-10-CM | POA: Diagnosis not present

## 2020-09-18 DIAGNOSIS — R262 Difficulty in walking, not elsewhere classified: Secondary | ICD-10-CM | POA: Diagnosis not present

## 2020-09-18 NOTE — Therapy (Signed)
[place]. [place], [place], [address] Phone: [phone]   Fax:  [phone]  Physical Therapy Treatment  Patient Details  Name: Kristen Hill MRN: [id number] Date of Birth: 19-May-1926 Referring Provider (PT): Dorina Hoyer Date: 09/18/2020   PT End of Session - 09/18/20 9937    Visit Number 3    Date for PT Re-Evaluation 11/12/20    PT Start Time 1319    PT Stop Time 1403    PT Time Calculation (min) 44 min    Activity Tolerance Patient tolerated treatment well    Behavior During Therapy [facility] for tasks assessed/performed           Past Medical History:  Diagnosis Date  . Aortic atherosclerosis ([place])   . Cancer [facility])    right breast  . Diabetes mellitus without complication ([place]) 1696   type 2  . Glaucoma    left eye  . Hematuria 10/31/2013  . History of external beam radiation therapy 07/21/17- 08/11/17   Right Breast 2.66 Gy X 16 fractions  . Hypertension   . Jaundice    in 1947  . Jaundice 1946  . Measles as a child  . Multinodular goiter   . Mumps as a child  . OA (osteoarthritis)    right knee, torn meniscus lateral and medial  . Other and unspecified hyperlipidemia 07/31/2013  . Personal history of radiation therapy   . Physical exam, annual 08/02/2013   Sees Dr Trinna Post at [facility] Dr Farris Has, Retinal specialist Dermatolgy  . Thyroid nodule   . Wears glasses   . Wears partial dentures     Past Surgical History:  Procedure Laterality Date  . BREAST LUMPECTOMY Right 06/14/2017  . BREAST LUMPECTOMY WITH RADIOACTIVE SEED LOCALIZATION Right 06/14/2017   Procedure: [place] BREAST LUMPECTOMY WITH RADIOACTIVE SEED LOCALIZATION ERAS PATHWAY;  Surgeon: Jovita Kussmaul, MD;  Location: [place];  Service: General;  Laterality: Right;  . COLONOSCOPY  09/2015  . EYE SURGERY  2009 and 2010   cataract both eyes  . EYE SURGERY  2012   laser to left eye, chalazion was removed with  laser  . KNEE SURGERY Right    medial meniscus and lateral menicus  . MULTIPLE TOOTH EXTRACTIONS    . PARTIAL KNEE ARTHROPLASTY Right 04/08/2018   Procedure: RIGHT UNICOMPARTMENTAL KNEE;  Surgeon: Dorna Leitz, MD;  [facility];  Service: Orthopedics;  Laterality: Right;  . TONSILLECTOMY  86 yrs old    There were no vitals filed for this visit.   Subjective Assessment - 09/18/20 1320    Subjective Pt reports she felt a little sore and had some leg cramps after last rx but is feeling good today    Currently in Pain? No/denies                             [facility] Adult PT Treatment/Exercise - 09/18/20 0001      High Level Balance   High Level Balance Activities Backward walking;Side stepping;Direction changes    High Level Balance Comments HH assist      Knee/Hip Exercises: Aerobic   Nustep L4 x 6 min      Knee/Hip Exercises: Seated   Long Arc Quad Both;1 set;10 reps    Long Arc Quad Limitations 2#    Ball Squeeze x10 3 sec hold    Other Seated Knee/Hip Exercises yellow ball chest press  1x10    Other Seated Knee/Hip Exercises seated press into exercise ball with 3 sec hold abs    Marching Both;1 set;10 reps    Marching Limitations 2#    Hamstring Curl Both;1 set;10 reps    Hamstring Limitations green tb    Sit to Sand 2 sets;10 reps;without UE support   holding ball                   PT Short Term Goals - 09/16/20 1447      PT SHORT TERM GOAL #1   Title independent with intiial HEP    Time 2    Status Achieved    Target Date 09/25/20             PT Long Term Goals - 09/11/20 1443      PT LONG TERM GOAL #1   Title Pt will be I with advanced HEP    Time 8    Period Weeks    Status New    Target Date 11/06/20      PT LONG TERM GOAL #2   Title Pt will demo Berg Balance Score 45/56 in order to decrease risk of falls    Baseline 32/56    Time 8    Period Weeks    Status New    Target Date 11/06/20      PT LONG TERM GOAL #3    Title Pt will demo BLE hip flexion/extension strength 4+/5    Time 8    Period Weeks    Status New    Target Date 11/06/20      PT LONG TERM GOAL #4   Title Pt will report able to rise from recliner with minimal difficulty    Time 8    Period Weeks    Status New    Target Date 11/06/20      PT LONG TERM GOAL #5   Title walk without cane for most distances    Time 8    Period Weeks    Status New    Target Date 11/06/20                 Plan - 09/18/20 1403    Clinical Impression Statement Pt reporting increased LE cramping after last rx, so reduced number of standing ex's this rx. Pt demos some shaking and difficulty rising to stand from seated position. With backwards walking and sidestepping pt required [facility] assist with occasional LOB. Cues for upright posture with gait. Continue to progress functional strength, balance, and endurance.    PT Treatment/Interventions ADLs/Self Care Home Management;Electrical Stimulation;Moist Heat;Gait training;Stair training;Functional mobility training;Therapeutic activities;Therapeutic exercise;Balance training;Neuromuscular re-education;Manual techniques;Patient/family education    PT Next Visit Plan get pt moving, LE strengthening, functional strengthening, balance ex's    Consulted and Agree with Plan of Care Patient           Patient will benefit from skilled therapeutic intervention in order to improve the following deficits and impairments:  Abnormal gait,Difficulty walking,Decreased endurance,Decreased activity tolerance,Decreased balance,Decreased mobility,Decreased strength,Postural dysfunction  Visit Diagnosis: Muscle weakness (generalized)  Difficulty in walking, not elsewhere classified  Abnormality of gait due to impairment of balance     Problem List Patient Active Problem List   Diagnosis Date Noted  . Primary osteoarthritis of right knee 04/08/2018  . Malignant neoplasm of upper-outer quadrant of right breast in  female, estrogen receptor positive ([place]) 06/29/2017  . Unspecified vitamin D deficiency 11/27/2013  . Hematuria 10/31/2013  .  Physical exam, annual 08/02/2013  . Other and unspecified hyperlipidemia 07/31/2013  . Diabetes mellitus without complication ([place])   . Hypertension   . Arthritis    Amador Cunas, PT, DPT Donald Prose Anureet [place]ington 09/18/2020, 2:06 PM  [place]. [place], [place], [address] Phone: [phone]   Fax:  [phone]  Name: Kristen Hill MRN: [id number] Date of Birth: June 21, 1926

## 2020-09-23 ENCOUNTER — Other Ambulatory Visit: Payer: Self-pay

## 2020-09-23 ENCOUNTER — Ambulatory Visit: Payer: Medicare PPO | Admitting: Physical Therapy

## 2020-09-23 ENCOUNTER — Encounter: Payer: Self-pay | Admitting: Physical Therapy

## 2020-09-23 DIAGNOSIS — M6281 Muscle weakness (generalized): Secondary | ICD-10-CM | POA: Diagnosis not present

## 2020-09-23 DIAGNOSIS — R262 Difficulty in walking, not elsewhere classified: Secondary | ICD-10-CM | POA: Diagnosis not present

## 2020-09-23 DIAGNOSIS — R2689 Other abnormalities of gait and mobility: Secondary | ICD-10-CM

## 2020-09-23 NOTE — Therapy (Signed)
[facility] [address]. [place], [place], [address] Phone: [phone]   Fax:  [phone]  Physical Therapy Treatment  Patient Details  Name: Kristen Hill MRN: [id number] Date of Birth: 21-Jun-1926 Referring Provider (PT): Lester Kinsman Date: 09/23/2020   PT End of Session - 09/23/20 0924    Visit Number 4    Date for PT Re-Evaluation 11/12/20    PT Start Time 0840    PT Stop Time 0923    PT Time Calculation (min) 43 min    Activity Tolerance Patient tolerated treatment well    Behavior During Therapy [facility] for tasks assessed/performed           Past Medical History:  Diagnosis Date  . Aortic atherosclerosis (H[facility])   . Cancer [facility])    right breast  . Diabetes mellitus without complication (H[facility]) 2008   type 2  . Glaucoma    left eye  . Hematuria 10/31/2013  . History of external beam radiation therapy 07/21/17- 08/11/17   Right Breast 2.66 Gy X 16 fractions  . Hypertension   . Jaundice    in 1947  . Jaundice 1946  . Measles as a child  . Multinodular goiter   . Mumps as a child  . OA (osteoarthritis)    right knee, torn meniscus lateral and medial  . Other and unspecified hyperlipidemia 07/31/2013  . Personal history of radiation therapy   . Physical exam, annual 08/02/2013   Sees [facility] Sondra Barges at [facility] Dr Carmon Ginsberg, Retinal specialist Dermatolgy  . Thyroid nodule   . Wears glasses   . Wears partial dentures     Past Surgical History:  Procedure Laterality Date  . BREAST LUMPECTOMY Right 06/14/2017  . BREAST LUMPECTOMY WITH RADIOACTIVE SEED LOCALIZATION Right 06/14/2017   Procedure: [place] BREAST LUMPECTOMY WITH RADIOACTIVE SEED LOCALIZATION ERAS PATHWAY;  Surgeon: Griselda Miner, MD;  Location: [facility] OR;  Service: General;  Laterality: Right;  . COLONOSCOPY  09/2015  . EYE SURGERY  2009 and 2010   cataract both eyes  . EYE SURGERY  2012   laser to left eye, chalazion was removed with  laser  . KNEE SURGERY Right    medial meniscus and lateral menicus  . MULTIPLE TOOTH EXTRACTIONS    . PARTIAL KNEE ARTHROPLASTY Right 04/08/2018   Procedure: RIGHT UNICOMPARTMENTAL KNEE;  Surgeon: Jodi Geralds, MD;  [facility];  Service: Orthopedics;  Laterality: Right;  . TONSILLECTOMY  86 yrs old    There were no vitals filed for this visit.   Subjective Assessment - 09/23/20 0842    Subjective My biggest issue is walking outside , curbs, steps and slants    Currently in Pain? No/denies                             [facility] Adult PT Treatment/Exercise - 09/23/20 0001      Ambulation/Gait   Gait Comments gait outside on uneven terrain, negotiating curb, HHA      High Level Balance   High Level Balance Activities Side stepping;Backward walking    High Level Balance Comments Airex balance beam tandem walk and side stepping, airex reaching and head turns, airex marches, solid surface ball toss, feet together head turns      Knee/Hip Exercises: Aerobic   Nustep L5 x 6 min      Knee/Hip Exercises: Machines for Strengthening   Cybex Knee Extension  5# 3x6    Cybex Knee Flexion 15# 2x10      Knee/Hip Exercises: Standing   Hip Abduction Both;1 set;10 reps    Hip Extension Both;1 set;10 reps                    PT Short Term Goals - 09/16/20 1447      PT SHORT TERM GOAL #1   Title independent with intiial HEP    Time 2    Status Achieved    Target Date 09/25/20             PT Long Term Goals - 09/23/20 0926      PT LONG TERM GOAL #1   Title Pt will be I with advanced HEP    Status On-going      PT LONG TERM GOAL #2   Title Pt will demo Berg Balance Score 45/56 in order to decrease risk of falls    Status On-going                 Plan - 09/23/20 0924    Clinical Impression Statement Patient has the most problem with getting up and taking the first few steps, she seems very unsteady, she has a lot of fear with walking on uneven  terrain.  Close supervision is needed    PT Next Visit Plan work on balance and safety    Consulted and Agree with Plan of Care Patient           Patient will benefit from skilled therapeutic intervention in order to improve the following deficits and impairments:  Abnormal gait,Difficulty walking,Decreased endurance,Decreased activity tolerance,Decreased balance,Decreased mobility,Decreased strength,Postural dysfunction  Visit Diagnosis: Muscle weakness (generalized)  Difficulty in walking, not elsewhere classified  Abnormality of gait due to impairment of balance     Problem List Patient Active Problem List   Diagnosis Date Noted  . Primary osteoarthritis of right knee 04/08/2018  . Malignant neoplasm of upper-outer quadrant of right breast in female, estrogen receptor positive ([employer]) 06/29/2017  . Unspecified vitamin D deficiency 11/27/2013  . Hematuria 10/31/2013  . Physical exam, annual 08/02/2013  . Other and unspecified hyperlipidemia 07/31/2013  . Diabetes mellitus without complication ([place])   . Hypertension   . Arthritis     Sumner Boast., PT 09/23/2020, 9:26 AM  [place]. [place], [place], [address] Phone: [phone]   Fax:  [phone]  Name: Kristen Hill MRN: [id number] Date of Birth: April 14, 1926

## 2020-09-25 ENCOUNTER — Ambulatory Visit: Payer: Medicare PPO | Admitting: Physical Therapy

## 2020-09-25 ENCOUNTER — Other Ambulatory Visit: Payer: Self-pay

## 2020-09-25 ENCOUNTER — Encounter: Payer: Self-pay | Admitting: Physical Therapy

## 2020-09-25 DIAGNOSIS — R262 Difficulty in walking, not elsewhere classified: Secondary | ICD-10-CM

## 2020-09-25 DIAGNOSIS — M6281 Muscle weakness (generalized): Secondary | ICD-10-CM

## 2020-09-25 DIAGNOSIS — R2689 Other abnormalities of gait and mobility: Secondary | ICD-10-CM | POA: Diagnosis not present

## 2020-09-25 NOTE — Therapy (Signed)
[place]. [place], [place], [address] Phone: [phone]   Fax:  [phone]  Physical Therapy Treatment  Patient Details  Name: Kristen Hill MRN: [id number] Date of Birth: 09/15/1926 Referring Provider (PT): Dorina Hoyer Date: 09/25/2020   PT End of Session - 09/25/20 1438    Visit Number 5    Date for PT Re-Evaluation 11/12/20    PT Start Time [id number]    PT Stop Time 1439    PT Time Calculation (min) 44 min    Activity Tolerance Patient tolerated treatment well    Behavior During Therapy [facility] for tasks assessed/performed           Past Medical History:  Diagnosis Date  . Aortic atherosclerosis ([place])   . Cancer [facility])    right breast  . Diabetes mellitus without complication ([place]) [id number]   type 2  . Glaucoma    left eye  . Hematuria 10/31/2013  . History of external beam radiation therapy 07/21/17- 08/11/17   Right Breast 2.66 Gy X 16 fractions  . Hypertension   . Jaundice    in 1947  . Jaundice 1946  . Measles as a child  . Multinodular goiter   . Mumps as a child  . OA (osteoarthritis)    right knee, torn meniscus lateral and medial  . Other and unspecified hyperlipidemia 07/31/2013  . Personal history of radiation therapy   . Physical exam, annual 08/02/2013   Sees Dr Trinna Post at [facility] Dr Farris Has, Retinal specialist Dermatolgy  . Thyroid nodule   . Wears glasses   . Wears partial dentures     Past Surgical History:  Procedure Laterality Date  . BREAST LUMPECTOMY Right 06/14/2017  . BREAST LUMPECTOMY WITH RADIOACTIVE SEED LOCALIZATION Right 06/14/2017   Procedure: [place] BREAST LUMPECTOMY WITH RADIOACTIVE SEED LOCALIZATION ERAS PATHWAY;  Surgeon: Jovita Kussmaul, MD;  Location: [place];  Service: General;  Laterality: Right;  . COLONOSCOPY  09/2015  . EYE SURGERY  2009 and 2010   cataract both eyes  . EYE SURGERY  2012   laser to left eye, chalazion was removed with  laser  . KNEE SURGERY Right    medial meniscus and lateral menicus  . MULTIPLE TOOTH EXTRACTIONS    . PARTIAL KNEE ARTHROPLASTY Right 04/08/2018   Procedure: RIGHT UNICOMPARTMENTAL KNEE;  Surgeon: Dorna Leitz, MD;  [facility];  Service: Orthopedics;  Laterality: Right;  . TONSILLECTOMY  86 yrs old    There were no vitals filed for this visit.   Subjective Assessment - 09/25/20 1402    Subjective I still am afraid of walking much outside    Currently in Pain? No/denies                             [facility] Adult PT Treatment/Exercise - 09/25/20 0001      Ambulation/Gait   Gait Comments gait outside on uneven terrain, negotiating curb, HHA      High Level Balance   High Level Balance Activities Backward walking;Side stepping;Direction changes    High Level Balance Comments Airex balance beam tandem walk and side stepping, airex reaching and head turns, airex marches, solid surface ball toss, feet together head turns      Knee/Hip Exercises: Aerobic   Nustep L5 x 6 min      Knee/Hip Exercises: Machines for Strengthening   Cybex Knee Extension 5# 3x6  Cybex Knee Flexion 20# 2x10      Knee/Hip Exercises: Standing   Hip Abduction Both;1 set;10 reps    Abduction Limitations 2#    Hip Extension Both;1 set;10 reps    Extension Limitations 2#      Knee/Hip Exercises: Seated   Ball Squeeze x10 3 sec hold                    PT Short Term Goals - 09/16/20 1447      PT SHORT TERM GOAL #1   Title independent with intiial HEP    Time 2    Status Achieved    Target Date 09/25/20             PT Long Term Goals - 09/25/20 1441      PT LONG TERM GOAL #1   Title Pt will be I with advanced HEP    Status On-going      PT LONG TERM GOAL #2   Title Pt will demo Berg Balance Score 45/56 in order to decrease risk of falls    Status On-going      PT LONG TERM GOAL #3   Title Pt will demo BLE hip flexion/extension strength 4+/5    Status On-going                  Plan - 09/25/20 1439    Clinical Impression Statement Patietn continues to struggle with the first step and the beginning of movements, once she gets moving she does well, direction changes and dynamic surfaces really scare her.  Needs CGA for the airex standing and ball toss activity.  She really favors the left hip but she denies pain, reports fatigue with the leg    PT Next Visit Plan work on balance and safety    Consulted and Agree with Plan of Care Patient           Patient will benefit from skilled therapeutic intervention in order to improve the following deficits and impairments:  Abnormal gait,Difficulty walking,Decreased endurance,Decreased activity tolerance,Decreased balance,Decreased mobility,Decreased strength,Postural dysfunction  Visit Diagnosis: Muscle weakness (generalized)  Difficulty in walking, not elsewhere classified  Abnormality of gait due to impairment of balance     Problem List Patient Active Problem List   Diagnosis Date Noted  . Primary osteoarthritis of right knee 04/08/2018  . Malignant neoplasm of upper-outer quadrant of right breast in female, estrogen receptor positive ([facility]) 06/29/2017  . Unspecified vitamin D deficiency 11/27/2013  . Hematuria 10/31/2013  . Physical exam, annual 08/02/2013  . Other and unspecified hyperlipidemia 07/31/2013  . Diabetes mellitus without complication (H[facility])   . Hypertension   . Arthritis     Jearld Lesch., PT 09/25/2020, 2:41 PM  [facility] [address] [place], [place], [address] Phone: [phone]   Fax:  [phone]  Name: Kristen Hill MRN: [id number] Date of Birth: [birth year]

## 2020-10-01 ENCOUNTER — Ambulatory Visit: Payer: Medicare PPO | Attending: Internal Medicine | Admitting: Physical Therapy

## 2020-10-01 ENCOUNTER — Other Ambulatory Visit: Payer: Self-pay

## 2020-10-01 ENCOUNTER — Encounter: Payer: Self-pay | Admitting: Physical Therapy

## 2020-10-01 ENCOUNTER — Ambulatory Visit: Payer: Medicare PPO | Admitting: Physical Therapy

## 2020-10-01 DIAGNOSIS — R2689 Other abnormalities of gait and mobility: Secondary | ICD-10-CM | POA: Insufficient documentation

## 2020-10-01 DIAGNOSIS — M6281 Muscle weakness (generalized): Secondary | ICD-10-CM | POA: Diagnosis not present

## 2020-10-01 DIAGNOSIS — R262 Difficulty in walking, not elsewhere classified: Secondary | ICD-10-CM | POA: Diagnosis not present

## 2020-10-01 NOTE — Therapy (Signed)
[facility] [address] [place], [place], [address] Phone: [phone]   Fax:  [phone]  Physical Therapy Treatment  Patient Details  Name: Demika Langenderfer MRN: [id number] Date of Birth: 1926-04-20 Referring Provider (PT): Lester Kinsman Date: 10/01/2020   PT End of Session - 10/01/20 1016    Visit Number 6    Date for PT Re-Evaluation 11/12/20    PT Start Time 0928    PT Stop Time 1014    PT Time Calculation (min) 46 min    Activity Tolerance Patient tolerated treatment well    Behavior During Therapy [facility] for tasks assessed/performed           Past Medical History:  Diagnosis Date  . Aortic atherosclerosis (HCC)   . Cancer [facility])    right breast  . Diabetes mellitus without complication (HCC) 2008   type 2  . Glaucoma    left eye  . Hematuria 10/31/2013  . History of external beam radiation therapy 07/21/17- 08/11/17   Right Breast 2.66 Gy X 16 fractions  . Hypertension   . Jaundice    in 1947  . Jaundice 1946  . Measles as a child  . Multinodular goiter   . Mumps as a child  . OA (osteoarthritis)    right knee, torn meniscus lateral and medial  . Other and unspecified hyperlipidemia 07/31/2013  . Personal history of radiation therapy   . Physical exam, annual 08/02/2013   Sees Dr Sondra [facility]arges at [facility] Dr Carmon Ginsberg, Retinal specialist Dermatolgy  . Thyroid nodule   . Wears glasses   . Wears partial dentures     Past Surgical History:  Procedure Laterality Date  . BREAST LUMPECTOMY Right 06/14/2017  . BREAST LUMPECTOMY WITH RADIOACTIVE SEED LOCALIZATION Right 06/14/2017   Procedure: [place] BREAST LUMPECTOMY WITH RADIOACTIVE SEED LOCALIZATION ERAS PATHWAY;  Surgeon: Griselda Miner, MD;  Location: [facility] OR;  Service: General;  Laterality: Right;  . COLONOSCOPY  09/2015  . EYE SURGERY  2009 and 2010   cataract both eyes  . EYE SURGERY  2012   laser to left eye, chalazion was removed with laser   . KNEE SURGERY Right    medial meniscus and lateral menicus  . MULTIPLE TOOTH EXTRACTIONS    . PARTIAL KNEE ARTHROPLASTY Right 04/08/2018   Procedure: RIGHT UNICOMPARTMENTAL KNEE;  Surgeon: Jodi Geralds, MD;  [facility];  Service: Orthopedics;  Laterality: Right;  . TONSILLECTOMY  87 yrs old    There were no vitals filed for this visit.   Subjective Assessment - 10/01/20 0929    Subjective Not walking outside much, Feel rushed around right now from getting up late    Pertinent History HTN, OA, DM    Limitations Standing;Walking    Currently in Pain? No/denies                             [facility] Adult PT Treatment/Exercise - 10/01/20 0001      High Level Balance   High Level Balance Activities Side stepping;Backward walking    High Level Balance Comments Airex balance beam tandem walk and side stepping, airex eyes closed      Knee/Hip Exercises: Aerobic   Nustep L5 x 6 min      Knee/Hip Exercises: Machines for Strengthening   Cybex Knee Extension 5# 3x8    Cybex Knee Flexion 20# 2x10  Knee/Hip Exercises: Standing   Other Standing Knee Exercises Alt 4in box taps 2x5      Knee/Hip Exercises: Seated   Ball Squeeze x10 3 sec hold                    PT Short Term Goals - 09/16/20 1447      PT SHORT TERM GOAL #1   Title independent with intiial HEP    Time 2    Status Achieved    Target Date 09/25/20             PT Long Term Goals - 09/25/20 1441      PT LONG TERM GOAL #1   Title Pt will be I with advanced HEP    Status On-going      PT LONG TERM GOAL #2   Title Pt will demo Berg Balance Score 45/56 in order to decrease risk of falls    Status On-going      PT LONG TERM GOAL #3   Title Pt will demo BLE hip flexion/extension strength 4+/5    Status On-going                 Plan - 10/01/20 1016    Clinical Impression Statement Increase time needed to initiate step after standing. She fatigues easily with activity  requiring multiple seated rest breaks. Some hesitation with alt step ups with little HHA. CGA needed for all dynamic surface activities.    Examination-Activity Limitations Locomotion Level;Stand;Stairs    Examination-Participation Restrictions Community Activity;Interpersonal Relationship    Stability/Clinical Decision Making Stable/Uncomplicated    Rehab Potential Good    PT Frequency 2x / week    PT Treatment/Interventions ADLs/Self Care Home Management;Electrical Stimulation;Moist Heat;Gait training;Stair training;Functional mobility training;Therapeutic activities;Therapeutic exercise;Balance training;Neuromuscular re-education;Manual techniques;Patient/family education    PT Next Visit Plan work on balance and safety           Patient will benefit from skilled therapeutic intervention in order to improve the following deficits and impairments:  Abnormal gait,Difficulty walking,Decreased endurance,Decreased activity tolerance,Decreased balance,Decreased mobility,Decreased strength,Postural dysfunction  Visit Diagnosis: Abnormality of gait due to impairment of balance  Difficulty in walking, not elsewhere classified  Muscle weakness (generalized)     Problem List Patient Active Problem List   Diagnosis Date Noted  . Primary osteoarthritis of right knee 04/08/2018  . Malignant neoplasm of upper-outer quadrant of right breast in female, estrogen receptor positive ([place]) 06/29/2017  . Unspecified vitamin D deficiency 11/27/2013  . Hematuria 10/31/2013  . Physical exam, annual 08/02/2013  . Other and unspecified hyperlipidemia 07/31/2013  . Diabetes mellitus without complication ([place])   . Hypertension   . Arthritis     Scot Jun 10/01/2020, 10:33 AM  [place]. [place], [place], [address] Phone: [phone]   Fax:  [phone]  Name: Markeitha Waechter MRN: [id number] Date of Birth:  [birth year]

## 2020-10-03 ENCOUNTER — Other Ambulatory Visit: Payer: Self-pay

## 2020-10-03 ENCOUNTER — Ambulatory Visit: Payer: Medicare PPO | Admitting: Physical Therapy

## 2020-10-03 DIAGNOSIS — M6281 Muscle weakness (generalized): Secondary | ICD-10-CM

## 2020-10-03 DIAGNOSIS — R262 Difficulty in walking, not elsewhere classified: Secondary | ICD-10-CM

## 2020-10-03 DIAGNOSIS — R2689 Other abnormalities of gait and mobility: Secondary | ICD-10-CM

## 2020-10-03 NOTE — Therapy (Signed)
[place]. [place], [place], [address] Phone: [phone]   Fax:  [phone]  Physical Therapy Treatment  Patient Details  Name: Kristen Hill MRN: [id number] Date of Birth: Jul 09, 1926 Referring Provider (PT): Dorina Hoyer Date: 10/03/2020   PT End of Session - 10/03/20 1105    Visit Number 7    Date for PT Re-Evaluation 11/12/20    PT Start Time [id number]    PT Stop Time 1105    PT Time Calculation (min) 50 min           Past Medical History:  Diagnosis Date  . Aortic atherosclerosis ([place])   . Cancer [facility])    right breast  . Diabetes mellitus without complication ([place]) [id number]   type 2  . Glaucoma    left eye  . Hematuria 10/31/2013  . History of external beam radiation therapy 07/21/17- 08/11/17   Right Breast 2.66 Gy X 16 fractions  . Hypertension   . Jaundice    in 1947  . Jaundice 1946  . Measles as a child  . Multinodular goiter   . Mumps as a child  . OA (osteoarthritis)    right knee, torn meniscus lateral and medial  . Other and unspecified hyperlipidemia 07/31/2013  . Personal history of radiation therapy   . Physical exam, annual 08/02/2013   Sees Dr Trinna Post at [facility] Dr Farris Has, Retinal specialist Dermatolgy  . Thyroid nodule   . Wears glasses   . Wears partial dentures     Past Surgical History:  Procedure Laterality Date  . BREAST LUMPECTOMY Right 06/14/2017  . BREAST LUMPECTOMY WITH RADIOACTIVE SEED LOCALIZATION Right 06/14/2017   Procedure: [place] BREAST LUMPECTOMY WITH RADIOACTIVE SEED LOCALIZATION ERAS PATHWAY;  Surgeon: Jovita Kussmaul, MD;  Location: [place];  Service: General;  Laterality: Right;  . COLONOSCOPY  09/2015  . EYE SURGERY  2009 and 2010   cataract both eyes  . EYE SURGERY  2012   laser to left eye, chalazion was removed with laser  . KNEE SURGERY Right    medial meniscus and lateral menicus  . MULTIPLE TOOTH EXTRACTIONS    . PARTIAL KNEE  ARTHROPLASTY Right 04/08/2018   Procedure: RIGHT UNICOMPARTMENTAL KNEE;  Surgeon: Dorna Leitz, MD;  [facility];  Service: Orthopedics;  Laterality: Right;  . TONSILLECTOMY  87 yrs old    There were no vitals filed for this visit.   Subjective Assessment - 10/03/20 1017    Subjective "balance is okay in earlier part of day but by evening I am pretty shakey"    Currently in Pain? No/denies                             [facility] Adult PT Treatment/Exercise - 10/03/20 0001      Ambulation/Gait   Gait Comments TUG with SPC 30 sec      High Level Balance   High Level Balance Comments ball toss on aire, side stepping on/off airex fwd/back and laterally- HHA min A with cuing with much hesitancy      Knee/Hip Exercises: Aerobic   Recumbent Bike L 1 5 min    Nustep L5 x 6 min      Knee/Hip Exercises: Machines for Strengthening   Cybex Knee Extension 5# 3x8    Cybex Knee Flexion 20# 2x10      Knee/Hip Exercises: Standing   Heel Raises Both;10  reps   HHA on airex   Hip Flexion Stengthening;Both;2 sets;5 reps;Knee bent   HHA on airex   Functional Squat 10 reps   HHA on airex   Other Standing Knee Exercises Alt 4in box taps 2x10      Knee/Hip Exercises: Seated   Sit to Sand 10 reps;without UE support   CGA                 PT Education - 10/03/20 1105    Education Details issued Balance HEP    Person(s) Educated Patient    Methods Explanation;Demonstration;Handout    Comprehension Verbalized understanding            PT Short Term Goals - 10/03/20 1054      PT SHORT TERM GOAL #1   Title independent with intiial HEP    Baseline doing elliptical 20 min at home added LE ex    Status Achieved             PT Long Term Goals - 10/03/20 1055      PT LONG TERM GOAL #1   Title Pt will be I with advanced HEP    Status On-going      PT LONG TERM GOAL #2   Title Pt will demo Berg Balance Score 45/56 in order to decrease risk of falls    Status  On-going      PT LONG TERM GOAL #3   Title Pt will demo BLE hip flexion/extension strength 4+/5    Status On-going      PT LONG TERM GOAL #4   Title Pt will report able to rise from recliner with minimal difficulty    Status On-going      PT LONG TERM GOAL #5   Title walk without cane for most distances    Status On-going                 Plan - 10/03/20 1105    Clinical Impression Statement pt very hesitant on dynamaic surface and extra processing time and assistacne needed. TUG 30 sec with SPC vs 17 sec 1 year ago.    PT Treatment/Interventions ADLs/Self Care Home Management;Electrical Stimulation;Moist Heat;Gait training;Stair training;Functional mobility training;Therapeutic activities;Therapeutic exercise;Balance training;Neuromuscular re-education;Manual techniques;Patient/family education    PT Next Visit Plan work on balance and safety           Patient will benefit from skilled therapeutic intervention in order to improve the following deficits and impairments:  Abnormal gait,Difficulty walking,Decreased endurance,Decreased activity tolerance,Decreased balance,Decreased mobility,Decreased strength,Postural dysfunction  Visit Diagnosis: Abnormality of gait due to impairment of balance  Difficulty in walking, not elsewhere classified  Muscle weakness (generalized)     Problem List Patient Active Problem List   Diagnosis Date Noted  . Primary osteoarthritis of right knee 04/08/2018  . Malignant neoplasm of upper-outer quadrant of right breast in female, estrogen receptor positive (HCC) 06/29/2017  . Unspecified vitamin D deficiency 11/27/2013  . Hematuria 10/31/2013  . Physical exam, annual 08/02/2013  . Other and unspecified hyperlipidemia 07/31/2013  . Diabetes mellitus without complication (HCC)   . Hypertension   . Arthritis     [name],[name] PTA 10/03/2020, 11:07 AM  [facility] [address] [place]. [place], [place], [address] Phone: [phone]   Fax:  [phone]  Name: Kristen Hill MRN: [id number] Date of Birth: 09-Oct-1925

## 2020-10-08 ENCOUNTER — Other Ambulatory Visit: Payer: Self-pay

## 2020-10-08 ENCOUNTER — Encounter: Payer: Self-pay | Admitting: Physical Therapy

## 2020-10-08 ENCOUNTER — Ambulatory Visit: Payer: Medicare PPO | Admitting: Physical Therapy

## 2020-10-08 DIAGNOSIS — R2689 Other abnormalities of gait and mobility: Secondary | ICD-10-CM | POA: Diagnosis not present

## 2020-10-08 DIAGNOSIS — M6281 Muscle weakness (generalized): Secondary | ICD-10-CM | POA: Diagnosis not present

## 2020-10-08 DIAGNOSIS — R262 Difficulty in walking, not elsewhere classified: Secondary | ICD-10-CM

## 2020-10-08 NOTE — Therapy (Signed)
[place]. [place], [place], [address] Phone: [phone]   Fax:  [phone]  Physical Therapy Treatment  Patient Details  Name: Kristen Hill MRN: [id number] Date of Birth: 09-01-1926 Referring Provider (PT): Dorina Hoyer Date: 10/08/2020   PT End of Session - 10/08/20 1144    Visit Number 8    Date for PT Re-Evaluation 11/12/20    PT Start Time 1050    PT Stop Time 1140    PT Time Calculation (min) 50 min    Activity Tolerance Patient tolerated treatment well    Behavior During Therapy [facility] for tasks assessed/performed           Past Medical History:  Diagnosis Date  . Aortic atherosclerosis ([place])   . Cancer [facility])    right breast  . Diabetes mellitus without complication ([place]) 4166   type 2  . Glaucoma    left eye  . Hematuria 10/31/2013  . History of external beam radiation therapy 07/21/17- 08/11/17   Right Breast 2.66 Gy X 16 fractions  . Hypertension   . Jaundice    in 1947  . Jaundice 1946  . Measles as a child  . Multinodular goiter   . Mumps as a child  . OA (osteoarthritis)    right knee, torn meniscus lateral and medial  . Other and unspecified hyperlipidemia 07/31/2013  . Personal history of radiation therapy   . Physical exam, annual 08/02/2013   Sees Dr Trinna Post at [facility] Dr Farris Has, Retinal specialist Dermatolgy  . Thyroid nodule   . Wears glasses   . Wears partial dentures     Past Surgical History:  Procedure Laterality Date  . BREAST LUMPECTOMY Right 06/14/2017  . BREAST LUMPECTOMY WITH RADIOACTIVE SEED LOCALIZATION Right 06/14/2017   Procedure: [place] BREAST LUMPECTOMY WITH RADIOACTIVE SEED LOCALIZATION ERAS PATHWAY;  Surgeon: Jovita Kussmaul, MD;  Location: [place];  Service: General;  Laterality: Right;  . COLONOSCOPY  09/2015  . EYE SURGERY  2009 and 2010   cataract both eyes  . EYE SURGERY  2012   laser to left eye, chalazion was removed with laser   . KNEE SURGERY Right    medial meniscus and lateral menicus  . MULTIPLE TOOTH EXTRACTIONS    . PARTIAL KNEE ARTHROPLASTY Right 04/08/2018   Procedure: RIGHT UNICOMPARTMENTAL KNEE;  Surgeon: Dorna Leitz, MD;  [facility];  Service: Orthopedics;  Laterality: Right;  . TONSILLECTOMY  87 yrs old    There were no vitals filed for this visit.   Subjective Assessment - 10/08/20 1051    Subjective I really struggled on that blue thing, she was referring to the airex    Currently in Pain? No/denies                             [facility] Adult PT Treatment/Exercise - 10/08/20 0001      Ambulation/Gait   Gait Comments gait outside with [facility] and HHA, she is very hesitant at times, the dynamic surface and slopes really frighten her., prior to going outside we walked with HHA and had her going faster, this fatigued her but as long as she had my hand she did well      High Level Balance   High Level Balance Activities Side stepping;Backward walking;Negotiating over obstacles;Direction changes    High Level Balance Comments on airex balance beam, tandem walk, and side stepping,  4" toe touches, then doing it onm airex, on airex reaching, and eyes closed      Knee/Hip Exercises: Aerobic   Recumbent Bike L 1 5 min    Nustep L5 x 6 min      Knee/Hip Exercises: Standing   Hip Flexion Both;1 set;10 reps    Hip Flexion Limitations 2.5#    Hip Abduction Both;10 reps    Abduction Limitations 2.5#    Hip Extension Both;10 reps    Extension Limitations 2.5#                    PT Short Term Goals - 10/03/20 1054      PT SHORT TERM GOAL #1   Title independent with intiial HEP    Baseline doing elliptical 20 min at home added LE ex    Status Achieved             PT Long Term Goals - 10/03/20 1055      PT LONG TERM GOAL #1   Title Pt will be I with advanced HEP    Status On-going      PT LONG TERM GOAL #2   Title Pt will demo Berg Balance Score 45/56 in order to  decrease risk of falls    Status On-going      PT LONG TERM GOAL #3   Title Pt will demo BLE hip flexion/extension strength 4+/5    Status On-going      PT LONG TERM GOAL #4   Title Pt will report able to rise from recliner with minimal difficulty    Status On-going      PT LONG TERM GOAL #5   Title walk without cane for most distances    Status On-going                 Plan - 10/08/20 1145    Clinical Impression Statement Patient is improving but very heistant with her first step even on solid surfaces, the dynamic surfaces and her stepping over objects really frightens her and she has difficulty with this.  Once up and moving she seems to gain confidence .    PT Next Visit Plan work on balance and safety    Consulted and Agree with Plan of Care Patient           Patient will benefit from skilled therapeutic intervention in order to improve the following deficits and impairments:  Abnormal gait,Difficulty walking,Decreased endurance,Decreased activity tolerance,Decreased balance,Decreased mobility,Decreased strength,Postural dysfunction  Visit Diagnosis: Abnormality of gait due to impairment of balance  Difficulty in walking, not elsewhere classified  Muscle weakness (generalized)     Problem List Patient Active Problem List   Diagnosis Date Noted  . Primary osteoarthritis of right knee 04/08/2018  . Malignant neoplasm of upper-outer quadrant of right breast in female, estrogen receptor positive ([place]) 06/29/2017  . Unspecified vitamin D deficiency 11/27/2013  . Hematuria 10/31/2013  . Physical exam, annual 08/02/2013  . Other and unspecified hyperlipidemia 07/31/2013  . Diabetes mellitus without complication ([place])   . Hypertension   . Arthritis     Sumner Boast., PT 10/08/2020, 11:48 AM  [place]. [place], [place], [address] Phone: [phone]   Fax:  [phone]  Name: Kristen Hill MRN: [id number] Date of Birth: November 08, 1925

## 2020-10-11 ENCOUNTER — Other Ambulatory Visit: Payer: Self-pay

## 2020-10-11 ENCOUNTER — Encounter: Payer: Self-pay | Admitting: Physical Therapy

## 2020-10-11 ENCOUNTER — Ambulatory Visit: Payer: Medicare PPO | Admitting: Physical Therapy

## 2020-10-11 DIAGNOSIS — M6281 Muscle weakness (generalized): Secondary | ICD-10-CM | POA: Diagnosis not present

## 2020-10-11 DIAGNOSIS — R262 Difficulty in walking, not elsewhere classified: Secondary | ICD-10-CM

## 2020-10-11 DIAGNOSIS — R2689 Other abnormalities of gait and mobility: Secondary | ICD-10-CM | POA: Diagnosis not present

## 2020-10-11 NOTE — Therapy (Signed)
[place]. [place], [place], [address] Phone: [phone]   Fax:  [phone]  Physical Therapy Treatment  Patient Details  Name: Kristen Hill MRN: [id number] Date of Birth: [birth year] Referring Provider (PT): Mina Marble   Encounter Date: 10/11/2020   PT End of Session - 10/11/20 1202    Visit Number 9    Date for PT Re-Evaluation 11/12/20    PT Start Time 1055    PT Stop Time 8453    PT Time Calculation (min) 49 min    Activity Tolerance Patient tolerated treatment well    Behavior During Therapy [facility] for tasks assessed/performed           Past Medical History:  Diagnosis Date   Aortic atherosclerosis ([place])    Cancer ([place])    right breast   Diabetes mellitus without complication ([place]) 6468   type 2   Glaucoma    left eye   Hematuria 10/31/2013   History of external beam radiation therapy 07/21/17- 08/11/17   Right Breast 2.66 Gy X 16 fractions   Hypertension    Jaundice    in 1947   Jaundice 1946   Measles as a child   Multinodular goiter    Mumps as a child   OA (osteoarthritis)    right knee, torn meniscus lateral and medial   Other and unspecified hyperlipidemia 07/31/2013   Personal history of radiation therapy    Physical exam, annual 08/02/2013   Se[facility] Dr Trinna Post at [facility] Dr Farris Has, Retinal specialist Dermatolgy   Thyroid nodule    Wears glasses    Wears partial dentures     Past Surgical History:  Procedure Laterality Date   BREAST LUMPECTOMY Right 06/14/2017   BREAST LUMPECTOMY WITH RADIOACTIVE SEED LOCALIZATION Right 06/14/2017   Procedure: [place] BREAST LUMPECTOMY WITH RADIOACTIVE SEED [place];  Surgeon: Jovita Kussmaul, MD;  Location: [place];  Service: General;  Laterality: Right;   COLONOSCOPY  09/2015   EYE SURGERY  2009 and 2010   cataract both eyes   EYE SURGERY  2012   laser to left eye, chalazion was removed with laser    KNEE SURGERY Right    medial meniscus and lateral menicus   MULTIPLE TOOTH EXTRACTIONS     PARTIAL KNEE ARTHROPLASTY Right 04/08/2018   Procedure: RIGHT UNICOMPARTMENTAL KNEE;  Surgeon: Dorna Leitz, MD;  [facility];  Service: Orthopedics;  Laterality: Right;   TONSILLECTOMY  87 yrs old    There were no vitals filed for this visit.   Subjective Assessment - 10/11/20 1112    Subjective I still feel really unsure                             [facility] Adult PT Treatment/Exercise - 10/11/20 0001      Ambulation/Gait   Gait Comments stairs step over step 6"a nd 4" up and down, fast walking, outside walking negotiating curbs      High Level Balance   High Level Balance Activities Side stepping;Backward walking;Negotiating over obstacles;Direction changes    High Level Balance Comments walking and looking different directions, 4" toe touches, , airex small ball toss, she does not feel comfortable with this so easy tosses and not far from her, on airex reaching to touch my hands, on airex red tband scap stab 2 ways      Knee/Hip Exercises: Seated   Sit to  Sand 10 reps;without UE support   holding red small weighted ball                   PT Short Term Goals - 10/03/20 1054      PT SHORT TERM GOAL #1   Title independent with intiial HEP    Baseline doing elliptical 20 min at home added LE ex    Status Achieved             PT Long Term Goals - 10/11/20 1204      PT LONG TERM GOAL #1   Title Pt will be I with advanced HEP    Status On-going      PT LONG TERM GOAL #3   Title Pt will demo BLE hip flexion/extension strength 4+/5    Status Partially Met                 Plan - 10/11/20 1202    Clinical Impression Statement Patietn struggles with direction changes, she had some difficulty with sit to stand without arms, she really has difficulty on the dynamic surfaces and had a lot of hesitation and need for CGA to initiate the step.     PT Next Visit Plan work on balance and safety    Consulted and Agree with Plan of Care Patient           Patient will benefit from skilled therapeutic intervention in order to improve the following deficits and impairments:  Abnormal gait,Difficulty walking,Decreased endurance,Decreased activity tolerance,Decreased balance,Decreased mobility,Decreased strength,Postural dysfunction  Visit Diagnosis: Abnormality of gait due to impairment of balance  Difficulty in walking, not elsewhere classified  Muscle weakness (generalized)     Problem List Patient Active Problem List   Diagnosis Date Noted   Primary osteoarthritis of right knee 04/08/2018   Malignant neoplasm of upper-outer quadrant of right breast in female, estrogen receptor positive ([place]) 06/29/2017   Unspecified vitamin D deficiency 11/27/2013   Hematuria 10/31/2013   Physical exam, annual 08/02/2013   Other and unspecified hyperlipidemia 07/31/2013   Diabetes mellitus without complication ([place])    Hypertension    Arthritis     Sumner Boast., PT 10/11/2020, 12:04 PM  [place]. [place], [place], [address] Phone: [phone]   Fax:  [phone]  Name: Kristen Hill MRN: [id number] Date of Birth: 1926-04-26

## 2020-10-15 ENCOUNTER — Ambulatory Visit: Payer: Medicare PPO | Admitting: Physical Therapy

## 2020-10-15 ENCOUNTER — Other Ambulatory Visit: Payer: Self-pay

## 2020-10-15 DIAGNOSIS — R2689 Other abnormalities of gait and mobility: Secondary | ICD-10-CM

## 2020-10-15 DIAGNOSIS — R262 Difficulty in walking, not elsewhere classified: Secondary | ICD-10-CM

## 2020-10-15 DIAGNOSIS — M6281 Muscle weakness (generalized): Secondary | ICD-10-CM

## 2020-10-15 NOTE — Therapy (Signed)
[place]. [place], [place], [address] Phone: [phone]   Fax:  [phone]  Physical Therapy Treatment Progress Note Reporting Period 09/11/2020 to 10/15/2020  See note below for Objective Data and Assessment of Progress/Goals.      Patient Details  Name: Kristen Hill MRN: [id number] Date of Birth: 07-Apr-1926 Referring Provider (PT): Dorina Hoyer Date: 10/15/2020   PT End of Session - 10/15/20 7902    Visit Number 10    Date for PT Re-Evaluation 11/12/20    PT Start Time 1525    PT Stop Time 4097    PT Time Calculation (min) 50 min           Past Medical History:  Diagnosis Date  . Aortic atherosclerosis ([place])   . Cancer [facility])    right breast  . Diabetes mellitus without complication ([place]) 3532   type 2  . Glaucoma    left eye  . Hematuria 10/31/2013  . History of external beam radiation therapy 07/21/17- 08/11/17   Right Breast 2.66 Gy X 16 fractions  . Hypertension   . Jaundice    in 1947  . Jaundice 1946  . Measles as a child  . Multinodular goiter   . Mumps as a child  . OA (osteoarthritis)    right knee, torn meniscus lateral and medial  . Other and unspecified hyperlipidemia 07/31/2013  . Personal history of radiation therapy   . Physical exam, annual 08/02/2013   Se[facility] Dr Trinna [facility] at [facility] Dr Farris Has, Retinal specialist Dermatolgy  . Thyroid nodule   . Wears glasses   . Wears partial dentures     Past Surgical History:  Procedure Laterality Date  . BREAST LUMPECTOMY Right 06/14/2017  . BREAST LUMPECTOMY WITH RADIOACTIVE SEED LOCALIZATION Right 06/14/2017   Procedure: [place] BREAST LUMPECTOMY WITH RADIOACTIVE SEED LOCALIZATION ERAS PATHWAY;  Surgeon: Jovita Kussmaul, MD;  Location: [place];  Service: General;  Laterality: Right;  . COLONOSCOPY  09/2015  . EYE SURGERY  2009 and 2010   cataract both eyes  . EYE SURGERY  2012   laser to left eye, chalazion was  removed with laser  . KNEE SURGERY Right    medial meniscus and lateral menicus  . MULTIPLE TOOTH EXTRACTIONS    . PARTIAL KNEE ARTHROPLASTY Right 04/08/2018   Procedure: RIGHT UNICOMPARTMENTAL KNEE;  Surgeon: Dorna Leitz, MD;  [facility];  Service: Orthopedics;  Laterality: Right;  . TONSILLECTOMY  87 yrs old    There were no vitals filed for this visit.   Subjective Assessment - 10/15/20 1524    Subjective doing okay. amb in with hiking boots saying they feel weird    Currently in Pain? No/denies                             [facility] Adult PT Treatment/Exercise - 10/15/20 0001      High Level Balance   High Level Balance Activities Negotitating around obstacles;Negotiating over obstacles   foam balance beam actvities   High Level Balance Comments obstacle course- CG- min A with use of SPC 50% time   ball toss on airex,ball kicking     Knee/Hip Exercises: Aerobic   Nustep L 5 6 min      Knee/Hip Exercises: Standing   Other Standing Knee Exercises Alt 6in box taps 2x10   6 inch step up, down  5 x  BIL. 4 inch lateral step up/down. Left weaker than rt   Other Standing Knee Exercises stepping over and back foam roll 2 sets 5 with SPC and HHA      Knee/Hip Exercises: Seated   Sit to Sand 10 reps;without UE support   min A with feet on airex                   PT Short Term Goals - 10/03/20 1054      PT SHORT TERM GOAL #1   Title independent with intiial HEP    Baseline doing elliptical 20 min at home added LE ex    Status Achieved             PT Long Term Goals - 10/11/20 1204      PT LONG TERM GOAL #1   Title Pt will be I with advanced HEP    Status On-going      PT LONG TERM GOAL #3   Title Pt will demo BLE hip flexion/extension strength 4+/5    Status Partially Met                 Plan - 10/15/20 1615    Clinical Impression Statement high level balnce actvities with and without SPC and CG- min A with cuing needed, much  hesitancy with initiation on ex. left LE weaker than RT with stepping up actvities.    PT Treatment/Interventions ADLs/Self Care Home Management;Electrical Stimulation;Moist Heat;Gait training;Stair training;Functional mobility training;Therapeutic activities;Therapeutic exercise;Balance training;Neuromuscular re-education;Manual techniques;Patient/family education    PT Next Visit Plan work on balance and safety-check goals           Patient will benefit from skilled therapeutic intervention in order to improve the following deficits and impairments:  Abnormal gait,Difficulty walking,Decreased endurance,Decreased activity tolerance,Decreased balance,Decreased mobility,Decreased strength,Postural dysfunction  Visit Diagnosis: Abnormality of gait due to impairment of balance  Difficulty in walking, not elsewhere classified  Muscle weakness (generalized)     Problem List Patient Active Problem List   Diagnosis Date Noted  . Primary osteoarthritis of right knee 04/08/2018  . Malignant neoplasm of upper-outer quadrant of right breast in female, estrogen receptor positive ([place]) 06/29/2017  . Unspecified vitamin D deficiency 11/27/2013  . Hematuria 10/31/2013  . Physical exam, annual 08/02/2013  . Other and unspecified hyperlipidemia 07/31/2013  . Diabetes mellitus without complication ([place])   . Hypertension   . Arthritis    Amador Cunas, PT, DPT Flem Enderle,[name] PTA 10/15/2020, 4:16 PM  [place]. [place], [place], [address] Phone: [phone]   Fax:  [phone]  Name: Kristen Hill MRN: [id number] Date of Birth: April 24, 1926

## 2020-10-17 ENCOUNTER — Other Ambulatory Visit: Payer: Self-pay

## 2020-10-17 ENCOUNTER — Ambulatory Visit: Payer: Medicare PPO | Admitting: Physical Therapy

## 2020-10-17 DIAGNOSIS — R2689 Other abnormalities of gait and mobility: Secondary | ICD-10-CM

## 2020-10-17 DIAGNOSIS — R262 Difficulty in walking, not elsewhere classified: Secondary | ICD-10-CM | POA: Diagnosis not present

## 2020-10-17 DIAGNOSIS — M6281 Muscle weakness (generalized): Secondary | ICD-10-CM | POA: Diagnosis not present

## 2020-10-17 NOTE — Therapy (Signed)
[place]. [place], [place], [address] Phone: [phone]   Fax:  [phone]  Physical Therapy Treatment  Patient Details  Name: Kristen Hill MRN: [id number] Date of Birth: Nov 24, 1925 Referring Provider (PT): Dorina Hoyer Date: 10/17/2020   PT End of Session - 10/17/20 6962    Visit Number 11    Date for PT Re-Evaluation 11/12/20    PT Start Time 1520    PT Stop Time 1603    PT Time Calculation (min) 43 min           Past Medical History:  Diagnosis Date  . Aortic atherosclerosis ([place])   . Cancer [facility])    right breast  . Diabetes mellitus without complication ([place]) 87   type 2  . Glaucoma    left eye  . Hematuria 10/31/2013  . History of external beam radiation therapy 07/21/17- 08/11/17   Right Breast 2.66 Gy X 16 fractions  . Hypertension   . Jaundice    in 87  . Jaundice 87  . Measles as a child  . Multinodular goiter   . Mumps as a child  . OA (osteoarthritis)    right knee, torn meniscus lateral and medial  . Other and unspecified hyperlipidemia 07/31/2013  . Personal history of radiation therapy   . Physical exam, annual 08/02/2013   Sees Dr Trinna Post at [facility] Dr Farris Has, Retinal specialist Dermatolgy  . Thyroid nodule   . Wears glasses   . Wears partial dentures     Past Surgical History:  Procedure Laterality Date  . BREAST LUMPECTOMY Right 06/14/2017  . BREAST LUMPECTOMY WITH RADIOACTIVE SEED LOCALIZATION Right 06/14/2017   Procedure: [place] BREAST LUMPECTOMY WITH RADIOACTIVE SEED LOCALIZATION ERAS PATHWAY;  Surgeon: Jovita Kussmaul, MD;  Location: [facility];  Service: General;  Laterality: Right;  . COLONOSCOPY  09/2015  . EYE SURGERY  2009 and 2010   cataract both eyes  . EYE SURGERY  2012   laser to left eye, chalazion was removed with laser  . KNEE SURGERY Right    medial meniscus and lateral menicus  . MULTIPLE TOOTH EXTRACTIONS    . PARTIAL KNEE  ARTHROPLASTY Right 04/08/2018   Procedure: RIGHT UNICOMPARTMENTAL KNEE;  Surgeon: Dorna Leitz, MD;  [facility];  Service: Orthopedics;  Laterality: Right;  . TONSILLECTOMY  87 yrs old    There were no vitals filed for this visit.   Subjective Assessment - 10/17/20 1520    Subjective very tired and slept well after last session.    Currently in Pain? No/denies                             [facility] Adult PT Treatment/Exercise - 10/17/20 0001      Standardized Balance Assessment   Standardized Balance Assessment Berg Balance Test      Berg Balance Test   Sit to Stand Able to stand  independently using hands    Standing Unsupported Able to stand safely 2 minutes    Sitting with Back Unsupported but Feet Supported on Floor or Stool Able to sit safely and securely 2 minutes    Stand to Sit Sits safely with minimal use of hands    Transfers Able to transfer safely, minor use of hands    Standing Unsupported with Eyes Closed Able to stand 10 seconds safely    Standing Ubsupported with Feet Together Able  to place feet together independently and stand for 1 minute with supervision    From Standing, Reach Forward with Outstretched Arm Can reach forward >12 cm safely (5")    From Standing Position, Pick up Object from [place] to pick up shoe, needs supervision    From Standing Position, Turn to Look Behind Over each Shoulder Looks behind one side only/other side shows less weight shift    Turn 360 Degrees Able to turn 360 degrees safely but slowly    Standing Unsupported, Alternately Place Feet on Step/Stool Able to complete >2 steps/needs minimal assist    Standing Unsupported, One Foot in [employer] balance while stepping or standing    Standing on One Leg Tries to lift leg/unable to hold 3 seconds but remains standing independently    Total Score 39      High Level Balance   High Level Balance Activities Backward walking;Marching forwards;Side stepping   HHA 3# ankle  wt 10 feet 2 times each     Knee/Hip Exercises: Aerobic   Nustep L 5 6 min      Knee/Hip Exercises: Machines for Strengthening   Cybex Knee Extension 5# 3x8    Cybex Knee Flexion 20# 3x10      Knee/Hip Exercises: Standing   Walking with Sports Cord 30# 3 x fwd/back and SW min A                    PT Short Term Goals - 10/03/20 1054      PT SHORT TERM GOAL #1   Title independent with intiial HEP    Baseline doing elliptical 20 min at home added LE ex    Status Achieved             PT Long Term Goals - 10/17/20 1522      PT LONG TERM GOAL #1   Title Pt will be I with advanced HEP    Status Partially Met      PT LONG TERM GOAL #2   Title Pt will demo Berg Balance Score 45/56 in order to decrease risk of falls    Baseline 39/56    Status Partially Met      PT LONG TERM GOAL #3   Title Pt will demo BLE hip flexion/extension strength 4+/5    Status Partially Met      PT LONG TERM GOAL #4   Title Pt will report able to rise from recliner with minimal difficulty    Status Partially Met      PT LONG TERM GOAL #5   Title walk without cane for most distances    Status Partially Met                 Plan - 10/17/20 1604    Clinical Impression Statement BERG increased from 32-39 making gaines but still a fall risk and slow fearful initiating mvmt. fatigue with wts today esp resisted gait needing min A .progressing with goals    PT Treatment/Interventions ADLs/Self Care Home Management;Electrical Stimulation;Moist Heat;Gait training;Stair training;Functional mobility training;Therapeutic activities;Therapeutic exercise;Balance training;Neuromuscular re-education;Manual techniques;Patient/family education    PT Next Visit Plan work on balance and safety           Patient will benefit from skilled therapeutic intervention in order to improve the following deficits and impairments:  Abnormal gait,Difficulty walking,Decreased endurance,Decreased activity  tolerance,Decreased balance,Decreased mobility,Decreased strength,Postural dysfunction  Visit Diagnosis: Abnormality of gait due to impairment of balance  Difficulty in walking, not  elsewhere classified  Muscle weakness (generalized)     Problem List Patient Active Problem List   Diagnosis Date Noted  . Primary osteoarthritis of right knee 04/08/2018  . Malignant neoplasm of upper-outer quadrant of right breast in female, estrogen receptor positive ([place]) 06/29/2017  . Unspecified vitamin D deficiency 11/27/2013  . Hematuria 10/31/2013  . Physical exam, annual 08/02/2013  . Other and unspecified hyperlipidemia 07/31/2013  . Diabetes mellitus without complication ([place])   . Hypertension   . Arthritis     Tetsuo Coppola,[name] PTA 10/17/2020, 4:07 PM  [place]. [place], [place], [address] Phone: [phone]   Fax:  [phone]  Name: Kristen Hill MRN: [id number] Date of Birth: September 01, 1926

## 2020-10-22 ENCOUNTER — Encounter: Payer: Self-pay | Admitting: Physical Therapy

## 2020-10-22 ENCOUNTER — Ambulatory Visit: Payer: Medicare PPO | Admitting: Physical Therapy

## 2020-10-22 ENCOUNTER — Other Ambulatory Visit: Payer: Self-pay

## 2020-10-22 DIAGNOSIS — R262 Difficulty in walking, not elsewhere classified: Secondary | ICD-10-CM

## 2020-10-22 DIAGNOSIS — M6281 Muscle weakness (generalized): Secondary | ICD-10-CM | POA: Diagnosis not present

## 2020-10-22 DIAGNOSIS — R2689 Other abnormalities of gait and mobility: Secondary | ICD-10-CM | POA: Diagnosis not present

## 2020-10-22 NOTE — Therapy (Signed)
[place]. [place], [place], [address] Phone: [phone]   Fax:  [phone]  Physical Therapy Treatment  Patient Details  Name: Kristen Hill MRN: [id number] Date of Birth: 03-13-1926 Referring Provider (PT): Dorina Hoyer Date: 10/22/2020   PT End of Session - 10/22/20 1605    Visit Number 12    Date for PT Re-Evaluation 11/12/20    PT Start Time 7342    PT Stop Time 1607    PT Time Calculation (min) 44 min    Activity Tolerance Patient tolerated treatment well    Behavior During Therapy [facility] for tasks assessed/performed           Past Medical History:  Diagnosis Date  . Aortic atherosclerosis ([place])   . Cancer [facility])    right breast  . Diabetes mellitus without complication ([place]) 8768   type 2  . Glaucoma    left eye  . Hematuria 10/31/2013  . History of external beam radiation therapy 07/21/17- 08/11/17   Right Breast 2.66 Gy X 16 fractions  . Hypertension   . Jaundice    in 1947  . Jaundice 1946  . Measles as a child  . Multinodular goiter   . Mumps as a child  . OA (osteoarthritis)    right knee, torn meniscus lateral and medial  . Other and unspecified hyperlipidemia 07/31/2013  . Personal history of radiation therapy   . Physical exam, annual 08/02/2013   Sees Dr Trinna Post at [facility] Dr Farris Has, Retinal specialist Dermatolgy  . Thyroid nodule   . Wears glasses   . Wears partial dentures     Past Surgical History:  Procedure Laterality Date  . BREAST LUMPECTOMY Right 06/14/2017  . BREAST LUMPECTOMY WITH RADIOACTIVE SEED LOCALIZATION Right 06/14/2017   Procedure: [place] BREAST LUMPECTOMY WITH RADIOACTIVE SEED LOCALIZATION ERAS PATHWAY;  Surgeon: Jovita Kussmaul, MD;  Location: [facility];  Service: General;  Laterality: Right;  . COLONOSCOPY  09/2015  . EYE SURGERY  2009 and 2010   cataract both eyes  . EYE SURGERY  2012   laser to left eye, chalazion was removed with  laser  . KNEE SURGERY Right    medial meniscus and lateral menicus  . MULTIPLE TOOTH EXTRACTIONS    . PARTIAL KNEE ARTHROPLASTY Right 04/08/2018   Procedure: RIGHT UNICOMPARTMENTAL KNEE;  Surgeon: Dorna Leitz, MD;  [facility];  Service: Orthopedics;  Laterality: Right;  . TONSILLECTOMY  87 yrs old    There were no vitals filed for this visit.   Subjective Assessment - 10/22/20 1525    Subjective I did some walking today, trying to get back into it, I just don't feel confident    Currently in Pain? No/denies                             [facility] Adult PT Treatment/Exercise - 10/22/20 0001      Ambulation/Gait   Gait Comments stairs step over step, then walk outside from back of building to her car in the front with one rest break, good pace      High Level Balance   High Level Balance Comments airex balance beam tandem walk and side stepping, on airex red tband rows and extension      Knee/Hip Exercises: Aerobic   Nustep L 5 6 min      Knee/Hip Exercises: Machines for Strengthening   Cybex  Knee Extension 5# 3x8    Cybex Knee Flexion 20# 3x10                    PT Short Term Goals - 10/03/20 1054      PT SHORT TERM GOAL #1   Title independent with intiial HEP    Baseline doing elliptical 20 min at home added LE ex    Status Achieved             PT Long Term Goals - 10/22/20 1614      PT LONG TERM GOAL #2   Title Pt will demo Berg Balance Score 45/56 in order to decrease risk of falls    Status Partially Met      PT LONG TERM GOAL #3   Title Pt will demo BLE hip flexion/extension strength 4+/5    Status Partially Met                 Plan - 10/22/20 1608    Clinical Impression Statement Patient doing much better, she still is very hesitant and has difficulty with the first few steps.  She also seems anxious about the first few steps, she is a little bit jerky with shen she feels off.  She needed a rest with the walking today.   She would like to get back to walking in her neighborhood.    PT Next Visit Plan work on balance and safety, functional gait    Consulted and Agree with Plan of Hill Patient           Patient will benefit from skilled therapeutic intervention in order to improve the following deficits and impairments:  Abnormal gait,Difficulty walking,Decreased endurance,Decreased activity tolerance,Decreased balance,Decreased mobility,Decreased strength,Postural dysfunction  Visit Diagnosis: Abnormality of gait due to impairment of balance  Difficulty in walking, not elsewhere classified  Muscle weakness (generalized)     Problem List Patient Active Problem List   Diagnosis Date Noted  . Primary osteoarthritis of right knee 04/08/2018  . Malignant neoplasm of upper-outer quadrant of right breast in female, estrogen receptor positive ([place]) 06/29/2017  . Unspecified vitamin D deficiency 11/27/2013  . Hematuria 10/31/2013  . Physical exam, annual 08/02/2013  . Other and unspecified hyperlipidemia 07/31/2013  . Diabetes mellitus without complication ([place])   . Hypertension   . Arthritis     Kristen Hill. PT 10/22/2020, 4:14 PM  [place]. [place], [place], [address] Phone: [phone]   Fax:  [phone]  Name: Kristen Hill MRN: [id number] Date of Birth: 1926/05/17

## 2020-10-24 ENCOUNTER — Other Ambulatory Visit: Payer: Self-pay

## 2020-10-24 ENCOUNTER — Ambulatory Visit: Payer: Medicare PPO | Admitting: Physical Therapy

## 2020-10-24 ENCOUNTER — Encounter: Payer: Self-pay | Admitting: Physical Therapy

## 2020-10-24 DIAGNOSIS — R262 Difficulty in walking, not elsewhere classified: Secondary | ICD-10-CM

## 2020-10-24 DIAGNOSIS — M6281 Muscle weakness (generalized): Secondary | ICD-10-CM | POA: Diagnosis not present

## 2020-10-24 DIAGNOSIS — R2689 Other abnormalities of gait and mobility: Secondary | ICD-10-CM | POA: Diagnosis not present

## 2020-10-24 NOTE — Therapy (Signed)
[place]. [place], [place], [address] Phone: [phone]   Fax:  [phone]  Physical Therapy Treatment  Patient Details  Name: Kristen Hill MRN: [id number] Date of Birth: 28-Jan-1926 Referring Provider (PT): Dorina Hoyer Date: 10/24/2020   PT End of Session - 10/24/20 1058    Visit Number 13    Date for PT Re-Evaluation 12/13/20    Authorization Type [employer]    PT Start Time 1013    PT Stop Time 1100    PT Time Calculation (min) 47 min    Activity Tolerance Patient tolerated treatment well    Behavior During Therapy [facility] for tasks assessed/performed           Past Medical History:  Diagnosis Date  . Aortic atherosclerosis ([place])   . Cancer [facility])    right breast  . Diabetes mellitus without complication ([place]) 9450   type 2  . Glaucoma    left eye  . Hematuria 10/31/2013  . History of external beam radiation therapy 07/21/17- 08/11/17   Right Breast 2.66 Gy X 16 fractions  . Hypertension   . Jaundice    in 1947  . Jaundice 1946  . Measles as a child  . Multinodular goiter   . Mumps as a child  . OA (osteoarthritis)    right knee, torn meniscus lateral and medial  . Other and unspecified hyperlipidemia 07/31/2013  . Personal history of radiation therapy   . Physical exam, annual 08/02/2013   Sees Dr Trinna Post at [facility] Dr Farris Has, Retinal specialist Dermatolgy  . Thyroid nodule   . Wears glasses   . Wears partial dentures     Past Surgical History:  Procedure Laterality Date  . BREAST LUMPECTOMY Right 06/14/2017  . BREAST LUMPECTOMY WITH RADIOACTIVE SEED LOCALIZATION Right 06/14/2017   Procedure: [place] BREAST LUMPECTOMY WITH RADIOACTIVE SEED LOCALIZATION ERAS PATHWAY;  Surgeon: Jovita Kussmaul, MD;  Location: [facility];  Service: General;  Laterality: Right;  . COLONOSCOPY  09/2015  . EYE SURGERY  2009 and 2010   cataract both eyes  . EYE SURGERY  2012   laser to left eye,  chalazion was removed with laser  . KNEE SURGERY Right    medial meniscus and lateral menicus  . MULTIPLE TOOTH EXTRACTIONS    . PARTIAL KNEE ARTHROPLASTY Right 04/08/2018   Procedure: RIGHT UNICOMPARTMENTAL KNEE;  Surgeon: Dorna Leitz, MD;  [facility];  Service: Orthopedics;  Laterality: Right;  . TONSILLECTOMY  87 yrs old    There were no vitals filed for this visit.   Subjective Assessment - 10/24/20 1018    Subjective I was a little tired after the last time with the walking    Currently in Pain? No/denies              [facility] PT Assessment - 10/24/20 0001      Ambulation/Gait   Gait Comments stairs step over step, fast walking with hand hold assist 2x 110 feet      High Level Balance   High Level Balance Activities Direction changes;Negotiating over obstacles    High Level Balance Comments on airex red tband 2 way scap, then reaching, fast walking      Berg Balance Test   Sit to Stand Able to stand  independently using hands    Standing Unsupported Able to stand safely 2 minutes    Sitting with Back Unsupported but Feet Supported on Floor or Stool  Able to sit safely and securely 2 minutes    Stand to Sit Controls descent by using hands    Transfers Able to transfer safely, definite need of hands    Standing Unsupported with Eyes Closed Able to stand 10 seconds safely    Standing Unsupported with Feet Together Able to place feet together independently and stand 1 minute safely    From Standing, Reach Forward with Outstretched Arm Can reach forward >12 cm safely (5")    From Standing Position, Pick up Object from Floor Able to pick up shoe, needs supervision    From Standing Position, Turn to Look Behind Over each Shoulder Looks behind one side only/other side shows less weight shift    Turn 360 Degrees Able to turn 360 degrees safely but slowly    Standing Unsupported, Alternately Place Feet on Step/Stool Able to complete 4 steps without aid or supervision    Standing  Unsupported, One Foot in Front Able to take small step independently and hold 30 seconds    Standing on One Leg Tries to lift leg/unable to hold 3 seconds but remains standing independently    Total Score 41                         [facility] Adult PT Treatment/Exercise - 10/24/20 0001      Knee/Hip Exercises: Aerobic   Recumbent Bike L 1 5 min    Nustep L 5 6 min                    PT Short Term Goals - 10/03/20 1054      PT SHORT TERM GOAL #1   Title independent with intiial HEP    Baseline doing elliptical 20 min at home added LE ex    Status Achieved             PT Long Term Goals - 10/24/20 1124      PT LONG TERM GOAL #1   Title Pt will be I with advanced HEP    Status Partially Met      PT LONG TERM GOAL #2   Title Pt will demo Berg Balance Score 45/56 in order to decrease risk of falls    Status Partially Met      PT LONG TERM GOAL #3   Title Pt will demo BLE hip flexion/extension strength 4+/5    Status Partially Met      PT LONG TERM GOAL #4   Title Pt will report able to rise from recliner with minimal difficulty    Status Partially Met      PT LONG TERM GOAL #5   Title walk without cane for most distances    Status Partially Met                 Plan - 10/24/20 1059    Clinical Impression Statement Patient increased her Berg balance score from 32/56 to 41/56.  This still puts her in the high risk for falls category, she really struggles with initiation of movements and actions, when I question her she does report that this is fear, reports that the fall she had was "traumatic".  She is using the cane for all ambulation, struggles with big steps and alternating and direction changes, needs CGA due to loss of balance    PT Frequency 2x / week    PT Duration 6 weeks    PT Treatment/Interventions ADLs/Self Care Home  Management;Electrical Stimulation;Moist Heat;Gait training;Stair training;Functional mobility training;Therapeutic  activities;Therapeutic exercise;Balance training;Neuromuscular re-education;Manual techniques;Patient/family education    PT Next Visit Plan work on balance and safety, functional gait    Consulted and Agree with Plan of Care Patient           Patient will benefit from skilled therapeutic intervention in order to improve the following deficits and impairments:  Abnormal gait,Difficulty walking,Decreased endurance,Decreased activity tolerance,Decreased balance,Decreased mobility,Decreased strength,Postural dysfunction  Visit Diagnosis: Abnormality of gait due to impairment of balance  Difficulty in walking, not elsewhere classified  Muscle weakness (generalized)     Problem List Patient Active Problem List   Diagnosis Date Noted  . Primary osteoarthritis of right knee 04/08/2018  . Malignant neoplasm of upper-outer quadrant of right breast in female, estrogen receptor positive ([place]) 06/29/2017  . Unspecified vitamin D deficiency 11/27/2013  . Hematuria 10/31/2013  . Physical exam, annual 08/02/2013  . Other and unspecified hyperlipidemia 07/31/2013  . Diabetes mellitus without complication ([place])   . Hypertension   . Arthritis     Sumner Boast., PT 10/24/2020, 11:38 AM  [place]. [place], [place], [address] Phone: [phone]   Fax:  [phone]  Name: Kristen Hill MRN: [id number] Date of Birth: 1926/03/13

## 2020-10-29 ENCOUNTER — Other Ambulatory Visit: Payer: Self-pay

## 2020-10-29 ENCOUNTER — Encounter: Payer: Self-pay | Admitting: Physical Therapy

## 2020-10-29 ENCOUNTER — Ambulatory Visit: Payer: Medicare PPO | Attending: Internal Medicine | Admitting: Physical Therapy

## 2020-10-29 DIAGNOSIS — R2689 Other abnormalities of gait and mobility: Secondary | ICD-10-CM | POA: Insufficient documentation

## 2020-10-29 DIAGNOSIS — M6281 Muscle weakness (generalized): Secondary | ICD-10-CM | POA: Diagnosis not present

## 2020-10-29 DIAGNOSIS — R262 Difficulty in walking, not elsewhere classified: Secondary | ICD-10-CM | POA: Insufficient documentation

## 2020-10-29 NOTE — Therapy (Signed)
[place]. [place], [place], [address] Phone: [phone]   Fax:  [phone]  Physical Therapy Treatment  Patient Details  Name: Kristen Hill MRN: [id number] Date of Birth: [birth year] Referring Provider (PT): Dorina Hoyer Date: 10/29/2020   PT End of Session - 10/29/20 1600    Visit Number 14    Number of Visits 25    Date for PT Re-Evaluation 12/13/20    Authorization Type [employer]    PT Start Time 9417    PT Stop Time 1557    PT Time Calculation (min) 42 min    Activity Tolerance Patient tolerated treatment well    Behavior During Therapy [facility] for tasks assessed/performed           Past Medical History:  Diagnosis Date  . Aortic atherosclerosis ([place])   . Cancer [facility])    right breast  . Diabetes mellitus without complication ([place]) 4081   type 2  . Glaucoma    left eye  . Hematuria 10/31/2013  . History of external beam radiation therapy 07/21/17- 08/11/17   Right Breast 2.66 Gy X 16 fractions  . Hypertension   . Jaundice    in 1947  . Jaundice 1946  . Measles as a child  . Multinodular goiter   . Mumps as a child  . OA (osteoarthritis)    right knee, torn meniscus lateral and medial  . Other and unspecified hyperlipidemia 07/31/2013  . Personal history of radiation therapy   . Physical exam, annual 08/02/2013   Sees Dr Trinna Post at [facility] Dr Farris Has, Retinal specialist Dermatolgy  . Thyroid nodule   . Wears glasses   . Wears partial dentures     Past Surgical History:  Procedure Laterality Date  . BREAST LUMPECTOMY Right 06/14/2017  . BREAST LUMPECTOMY WITH RADIOACTIVE SEED LOCALIZATION Right 06/14/2017   Procedure: [place] BREAST LUMPECTOMY WITH RADIOACTIVE SEED LOCALIZATION ERAS PATHWAY;  Surgeon: Jovita Kussmaul, MD;  Location: [place];  Service: General;  Laterality: Right;  . COLONOSCOPY  09/2015  . EYE SURGERY  2009 and 2010   cataract both eyes  . EYE SURGERY  2012    laser to left eye, chalazion was removed with laser  . KNEE SURGERY Right    medial meniscus and lateral menicus  . MULTIPLE TOOTH EXTRACTIONS    . PARTIAL KNEE ARTHROPLASTY Right 04/08/2018   Procedure: RIGHT UNICOMPARTMENTAL KNEE;  Surgeon: Dorna Leitz, MD;  [facility];  Service: Orthopedics;  Laterality: Right;  . TONSILLECTOMY  87 yrs old    There were no vitals filed for this visit.   Subjective Assessment - 10/29/20 1521    Subjective Feeling pretty good    Currently in Pain? No/denies              [facility] PT Assessment - 10/29/20 0001      Standardized Balance Assessment   Standardized Balance Assessment Timed Up and Go Test      Timed Up and Go Test   Normal TUG (seconds) 18    TUG Comments no device                         [facility] Adult PT Treatment/Exercise - 10/29/20 0001      Ambulation/Gait   Gait Comments stairs step over step, fast walking with hand hold assist 2x 110 feet, walking outside around the back building with SPC, and SBA, she  rquired about 5 rest breaks coming up th ehill      High Level Balance   High Level Balance Activities Direction changes;Negotiating over obstacles    High Level Balance Comments walking ball toss, on airex ball toss and reaching, obstacle course                    PT Short Term Goals - 10/03/20 1054      PT SHORT TERM GOAL #1   Title independent with intiial HEP    Baseline doing elliptical 20 min at home added LE ex    Status Achieved             PT Long Term Goals - 10/24/20 1124      PT LONG TERM GOAL #1   Title Pt will be I with advanced HEP    Status Partially Met      PT LONG TERM GOAL #2   Title Pt will demo Berg Balance Score 45/56 in order to decrease risk of falls    Status Partially Met      PT LONG TERM GOAL #3   Title Pt will demo BLE hip flexion/extension strength 4+/5    Status Partially Met      PT LONG TERM GOAL #4   Title Pt will report able to rise from  recliner with minimal difficulty    Status Partially Met      PT LONG TERM GOAL #5   Title walk without cane for most distances    Status Partially Met                 Plan - 10/29/20 1601    Clinical Impression Statement Patient struggled walking up the hill today, she required about 5 rest breaks, she really still hesitates and has difficulty with negotiating a curb.  Needed CGA and some verbal cues.    PT Next Visit Plan work on balance and safety, functional gait    Consulted and Agree with Plan of Care Patient           Patient will benefit from skilled therapeutic intervention in order to improve the following deficits and impairments:  Abnormal gait,Difficulty walking,Decreased endurance,Decreased activity tolerance,Decreased balance,Decreased mobility,Decreased strength,Postural dysfunction  Visit Diagnosis: Abnormality of gait due to impairment of balance  Difficulty in walking, not elsewhere classified  Muscle weakness (generalized)     Problem List Patient Active Problem List   Diagnosis Date Noted  . Primary osteoarthritis of right knee 04/08/2018  . Malignant neoplasm of upper-outer quadrant of right breast in female, estrogen receptor positive ([place]) 06/29/2017  . Unspecified vitamin D deficiency 11/27/2013  . Hematuria 10/31/2013  . Physical exam, annual 08/02/2013  . Other and unspecified hyperlipidemia 07/31/2013  . Diabetes mellitus without complication ([place])   . Hypertension   . Arthritis     Sumner Boast., PT 10/29/2020, 4:02 PM  [place]. [place], [place], [address] Phone: [phone]   Fax:  [phone]  Name: Kristen Hill MRN: [id number] Date of Birth: May 17, 1926

## 2020-10-31 ENCOUNTER — Ambulatory Visit: Payer: Medicare PPO | Admitting: Physical Therapy

## 2020-10-31 ENCOUNTER — Other Ambulatory Visit: Payer: Self-pay

## 2020-10-31 DIAGNOSIS — R2689 Other abnormalities of gait and mobility: Secondary | ICD-10-CM | POA: Diagnosis not present

## 2020-10-31 DIAGNOSIS — R262 Difficulty in walking, not elsewhere classified: Secondary | ICD-10-CM

## 2020-10-31 DIAGNOSIS — M6281 Muscle weakness (generalized): Secondary | ICD-10-CM

## 2020-10-31 NOTE — Therapy (Signed)
[place]. [place], [place], [address] Phone: [phone]   Fax:  [phone]  Physical Therapy Treatment  Patient Details  Name: Kristen Hill MRN: [id number] Date of Birth: 1926-04-15 Referring Provider (PT): Dorina Hoyer Date: 10/31/2020   PT End of Session - 10/31/20 1223    Visit Number 15    Date for PT Re-Evaluation 12/13/20    PT Start Time 1700    PT Stop Time 1230    PT Time Calculation (min) 48 min           Past Medical History:  Diagnosis Date  . Aortic atherosclerosis ([place])   . Cancer [facility])    right breast  . Diabetes mellitus without complication ([place]) 1749   type 2  . Glaucoma    left eye  . Hematuria 10/31/2013  . History of external beam radiation therapy 07/21/17- 08/11/17   Right Breast 2.66 Gy X 16 fractions  . Hypertension   . Jaundice    in 1947  . Jaundice 1946  . Measles as a child  . Multinodular goiter   . Mumps as a child  . OA (osteoarthritis)    right knee, torn meniscus lateral and medial  . Other and unspecified hyperlipidemia 07/31/2013  . Personal history of radiation therapy   . Physical exam, annual 08/02/2013   Sees Dr Trinna Post at [facility] Dr Farris Has, Retinal specialist Dermatolgy  . Thyroid nodule   . Wears glasses   . Wears partial dentures     Past Surgical History:  Procedure Laterality Date  . BREAST LUMPECTOMY Right 06/14/2017  . BREAST LUMPECTOMY WITH RADIOACTIVE SEED LOCALIZATION Right 06/14/2017   Procedure: [place] BREAST LUMPECTOMY WITH RADIOACTIVE SEED LOCALIZATION ERAS PATHWAY;  Surgeon: Jovita Kussmaul, MD;  Location: [place];  Service: General;  Laterality: Right;  . COLONOSCOPY  09/2015  . EYE SURGERY  2009 and 2010   cataract both eyes  . EYE SURGERY  2012   laser to left eye, chalazion was removed with laser  . KNEE SURGERY Right    medial meniscus and lateral menicus  . MULTIPLE TOOTH EXTRACTIONS    . PARTIAL KNEE  ARTHROPLASTY Right 04/08/2018   Procedure: RIGHT UNICOMPARTMENTAL KNEE;  Surgeon: Dorna Leitz, MD;  [facility];  Service: Orthopedics;  Laterality: Right;  . TONSILLECTOMY  87 yrs old    There were no vitals filed for this visit.   Subjective Assessment - 10/31/20 1142    Subjective doing okay, noted increased cadeance and stride    Currently in Pain? No/denies                             [facility] Adult PT Treatment/Exercise - 10/31/20 0001      Berg Balance Test   Sit to Stand Able to stand  independently using hands    Standing Unsupported Able to stand safely 2 minutes    Sitting with Back Unsupported but Feet Supported on Floor or Stool Able to sit safely and securely 2 minutes    Stand to Sit Sits safely with minimal use of hands    Transfers Able to transfer safely, minor use of hands    Standing Unsupported with Eyes Closed Able to stand 10 seconds safely    Standing Ubsupported with Feet Together Able to place feet together independently and stand for 1 minute with supervision    From Standing,  Reach Forward with Outstretched Arm Can reach forward >12 cm safely (5")    From Standing Position, Pick up Object from Floor Able to pick up shoe, needs supervision    From Standing Position, Turn to Look Behind Over each Shoulder Looks behind one side only/other side shows less weight shift    Turn 360 Degrees Able to turn 360 degrees safely in 4 seconds or less    Standing Unsupported, Alternately Place Feet on Step/Stool Able to stand independently and complete 8 steps >20 seconds    Standing Unsupported, One Foot in Front Needs help to step but can hold 15 seconds    Standing on One Leg Unable to try or needs assist to prevent fall    Total Score 43      High Level Balance   High Level Balance Activities Side stepping;Backward walking;Tandem walking;Marching forwards;Marching backwards;Negotitating around obstacles;Negotiating over obstacles   on foam mat or  beam, ball toss   High Level Balance Comments tandem 10 sec 2 x each assistance to get there   SLS amd alt step tap alt and SLS     Knee/Hip Exercises: Aerobic   Nustep L 5 6 min      Knee/Hip Exercises: Machines for Strengthening   Cybex Knee Extension 5# 3x10    Cybex Knee Flexion 20# 3x10                    PT Short Term Goals - 10/03/20 1054      PT SHORT TERM GOAL #1   Title independent with intiial HEP    Baseline doing elliptical 20 min at home added LE ex    Status Achieved             PT Long Term Goals - 10/31/20 1222      PT LONG TERM GOAL #1   Title Pt will be I with advanced HEP    Status Achieved      PT LONG TERM GOAL #2   Title Pt will demo Berg Balance Score 45/56 in order to decrease risk of falls    Baseline 43/56    Status Partially Met      PT LONG TERM GOAL #3   Title Pt will demo BLE hip flexion/extension strength 4+/5    Status Partially Met      PT LONG TERM GOAL #4   Title Pt will report able to rise from recliner with minimal difficulty    Status Partially Met      PT LONG TERM GOAL #5   Title walk without cane for most distances    Status Partially Met                 Plan - 10/31/20 1223    Clinical Impression Statement progressing with goals. BERG increased 4 pt in 2 weeks focus on high level dynamic balnc eand BERG components with assitance- fear and hesitancy noted esp as start    PT Treatment/Interventions ADLs/Self Care Home Management;Electrical Stimulation;Moist Heat;Gait training;Stair training;Functional mobility training;Therapeutic activities;Therapeutic exercise;Balance training;Neuromuscular re-education;Manual techniques;Patient/family education    PT Next Visit Plan work on balance and safety, functional gait           Patient will benefit from skilled therapeutic intervention in order to improve the following deficits and impairments:  Abnormal gait,Difficulty walking,Decreased endurance,Decreased  activity tolerance,Decreased balance,Decreased mobility,Decreased strength,Postural dysfunction  Visit Diagnosis: Difficulty in walking, not elsewhere classified  Muscle weakness (generalized)  Abnormality of gait due to  impairment of balance     Problem List Patient Active Problem List   Diagnosis Date Noted  . Primary osteoarthritis of right knee 04/08/2018  . Malignant neoplasm of upper-outer quadrant of right breast in female, estrogen receptor positive ([place]) 06/29/2017  . Unspecified vitamin D deficiency 11/27/2013  . Hematuria 10/31/2013  . Physical exam, annual 08/02/2013  . Other and unspecified hyperlipidemia 07/31/2013  . Diabetes mellitus without complication ([place])   . Hypertension   . Arthritis     Aylee Littrell,[name] PTA 10/31/2020, 12:26 PM  [place]. [place], [place], [address] Phone: [phone]   Fax:  [phone]  Name: Kristen Hill MRN: [id number] Date of Birth: 03/15/1926

## 2020-11-04 ENCOUNTER — Encounter: Payer: Self-pay | Admitting: Physical Therapy

## 2020-11-04 ENCOUNTER — Ambulatory Visit: Payer: Medicare PPO | Admitting: Physical Therapy

## 2020-11-04 ENCOUNTER — Other Ambulatory Visit: Payer: Self-pay

## 2020-11-04 DIAGNOSIS — R2689 Other abnormalities of gait and mobility: Secondary | ICD-10-CM | POA: Diagnosis not present

## 2020-11-04 DIAGNOSIS — M6281 Muscle weakness (generalized): Secondary | ICD-10-CM | POA: Diagnosis not present

## 2020-11-04 DIAGNOSIS — R262 Difficulty in walking, not elsewhere classified: Secondary | ICD-10-CM | POA: Diagnosis not present

## 2020-11-04 NOTE — Therapy (Signed)
[place]. [place], [place], [address] Phone: [phone]   Fax:  [phone]  Physical Therapy Treatment  Patient Details  Name: Kristen Hill MRN: [id number] Date of Birth: Mar 04, 1926 Referring Provider (PT): Dorina Hoyer Date: 11/04/2020   PT End of Session - 11/04/20 1602    Visit Number 16    Number of Visits 25    Date for PT Re-Evaluation 12/13/20    Authorization Type [employer]    PT Start Time 1515    PT Stop Time 1600    PT Time Calculation (min) 45 min    Activity Tolerance Patient tolerated treatment well    Behavior During Therapy [facility] for tasks assessed/performed           Past Medical History:  Diagnosis Date  . Aortic atherosclerosis ([place])   . Cancer [facility])    right breast  . Diabetes mellitus without complication ([place]) 6962   type 2  . Glaucoma    left eye  . Hematuria 10/31/2013  . History of external beam radiation therapy 07/21/17- 08/11/17   Right Breast 2.66 Gy X 16 fractions  . Hypertension   . Jaundice    in 1947  . Jaundice 1946  . Measles as a child  . Multinodular goiter   . Mumps as a child  . OA (osteoarthritis)    right knee, torn meniscus lateral and medial  . Other and unspecified hyperlipidemia 07/31/2013  . Personal history of radiation therapy   . Physical exam, annual 08/02/2013   Sees Dr Trinna Post at [facility] Dr Farris Has, Retinal specialist Dermatolgy  . Thyroid nodule   . Wears glasses   . Wears partial dentures     Past Surgical History:  Procedure Laterality Date  . BREAST LUMPECTOMY Right 06/14/2017  . BREAST LUMPECTOMY WITH RADIOACTIVE SEED LOCALIZATION Right 06/14/2017   Procedure: [place] BREAST LUMPECTOMY WITH RADIOACTIVE SEED LOCALIZATION ERAS PATHWAY;  Surgeon: Jovita Kussmaul, MD;  Location: [place];  Service: General;  Laterality: Right;  . COLONOSCOPY  09/2015  . EYE SURGERY  2009 and 2010   cataract both eyes  . EYE SURGERY  2012    laser to left eye, chalazion was removed with laser  . KNEE SURGERY Right    medial meniscus and lateral menicus  . MULTIPLE TOOTH EXTRACTIONS    . PARTIAL KNEE ARTHROPLASTY Right 04/08/2018   Procedure: RIGHT UNICOMPARTMENTAL KNEE;  Surgeon: Dorna Leitz, MD;  [facility];  Service: Orthopedics;  Laterality: Right;  . TONSILLECTOMY  87 yrs old    There were no vitals filed for this visit.   Subjective Assessment - 11/04/20 1520    Subjective WEt and cold, did not do much this weekend    Currently in Pain? No/denies                             [facility] Adult PT Treatment/Exercise - 11/04/20 0001      High Level Balance   High Level Balance Activities Direction changes;Side stepping;Backward walking    High Level Balance Comments walking two laps ball toss, bounce, stepping forward over a half roll, side stepping over a dowel rod      Knee/Hip Exercises: Aerobic   Recumbent Bike L 1 5 min    Nustep L 5 5 min      Knee/Hip Exercises: Standing   Heel Raises Both;10 reps  Heel Raises Limitations 3#    Hip Flexion Both;10 reps    Hip Flexion Limitations 3#    Hip Abduction Both;10 reps    Abduction Limitations 3#    Hip Extension Both;10 reps    Extension Limitations 3#      Knee/Hip Exercises: Seated   Other Seated Knee/Hip Exercises sit to stand holding 2# at chest 3x3                    PT Short Term Goals - 10/03/20 1054      PT SHORT TERM GOAL #1   Title independent with intiial HEP    Baseline doing elliptical 20 min at home added LE ex    Status Achieved             PT Long Term Goals - 10/31/20 1222      PT LONG TERM GOAL #1   Title Pt will be I with advanced HEP    Status Achieved      PT LONG TERM GOAL #2   Title Pt will demo Berg Balance Score 45/56 in order to decrease risk of falls    Baseline 43/56    Status Partially Met      PT LONG TERM GOAL #3   Title Pt will demo BLE hip flexion/extension strength 4+/5     Status Partially Met      PT LONG TERM GOAL #4   Title Pt will report able to rise from recliner with minimal difficulty    Status Partially Met      PT LONG TERM GOAL #5   Title walk without cane for most distances    Status Partially Met                 Plan - 11/04/20 1602    Clinical Impression Statement Patient with c/o stiff with cold wet weather.  Struggled getting up from sitting with holding weight in her hands.  She did better stepping over things today, still needing CGA, many times she needs CGA to initiate the exercises.  Direction changes are difficult and any time on the dynamic surface is very difficult    PT Next Visit Plan work on balance and safety, functional gait    Consulted and Agree with Plan of Care Patient           Patient will benefit from skilled therapeutic intervention in order to improve the following deficits and impairments:  Abnormal gait,Difficulty walking,Decreased endurance,Decreased activity tolerance,Decreased balance,Decreased mobility,Decreased strength,Postural dysfunction  Visit Diagnosis: Difficulty in walking, not elsewhere classified  Muscle weakness (generalized)  Abnormality of gait due to impairment of balance     Problem List Patient Active Problem List   Diagnosis Date Noted  . Primary osteoarthritis of right knee 04/08/2018  . Malignant neoplasm of upper-outer quadrant of right breast in female, estrogen receptor positive ([place]) 06/29/2017  . Unspecified vitamin D deficiency 11/27/2013  . Hematuria 10/31/2013  . Physical exam, annual 08/02/2013  . Other and unspecified hyperlipidemia 07/31/2013  . Diabetes mellitus without complication ([place])   . Hypertension   . Arthritis     Sumner Boast., PT 11/04/2020, 4:44 PM  [place]. [place], [place], [address] Phone: [phone]   Fax:  [phone]  Name: Kristen Hill MRN:  [id number] Date of Birth: [birth year]

## 2020-11-07 ENCOUNTER — Encounter: Payer: Self-pay | Admitting: Physical Therapy

## 2020-11-07 ENCOUNTER — Ambulatory Visit: Payer: Medicare PPO | Admitting: Physical Therapy

## 2020-11-07 ENCOUNTER — Other Ambulatory Visit: Payer: Self-pay

## 2020-11-07 DIAGNOSIS — R2689 Other abnormalities of gait and mobility: Secondary | ICD-10-CM

## 2020-11-07 DIAGNOSIS — M6281 Muscle weakness (generalized): Secondary | ICD-10-CM | POA: Diagnosis not present

## 2020-11-07 DIAGNOSIS — R262 Difficulty in walking, not elsewhere classified: Secondary | ICD-10-CM

## 2020-11-07 NOTE — Therapy (Signed)
[place]. [place], [place], [address] Phone: [phone]   Fax:  [phone]  Physical Therapy Treatment  Patient Details  Name: Kristen Hill MRN: [id number] Date of Birth: July 31, 1926 Referring Provider (PT): Dorina Hoyer Date: 11/07/2020   PT End of Session - 11/07/20 1421    Visit Number 17    Number of Visits 25    Date for PT Re-Evaluation 12/13/20    Authorization Type [employer]    PT Start Time 1300    PT Stop Time 1342    PT Time Calculation (min) 42 min    Activity Tolerance Patient tolerated treatment well    Behavior During Therapy [facility] for tasks assessed/performed           Past Medical History:  Diagnosis Date  . Aortic atherosclerosis ([place])   . Cancer [facility])    right breast  . Diabetes mellitus without complication ([place]) 2505   type 2  . Glaucoma    left eye  . Hematuria 10/31/2013  . History of external beam radiation therapy 07/21/17- 08/11/17   Right Breast 2.66 Gy X 16 fractions  . Hypertension   . Jaundice    in 1947  . Jaundice 1946  . Measles as a child  . Multinodular goiter   . Mumps as a child  . OA (osteoarthritis)    right knee, torn meniscus lateral and medial  . Other and unspecified hyperlipidemia 07/31/2013  . Personal history of radiation therapy   . Physical exam, annual 08/02/2013   Sees Dr Trinna Post at [facility] Dr Farris Has, Retinal specialist Dermatolgy  . Thyroid nodule   . Wears glasses   . Wears partial dentures     Past Surgical History:  Procedure Laterality Date  . BREAST LUMPECTOMY Right 06/14/2017  . BREAST LUMPECTOMY WITH RADIOACTIVE SEED LOCALIZATION Right 06/14/2017   Procedure: [place] BREAST LUMPECTOMY WITH RADIOACTIVE SEED LOCALIZATION ERAS PATHWAY;  Surgeon: Jovita Kussmaul, MD;  Location: [place];  Service: General;  Laterality: Right;  . COLONOSCOPY  09/2015  . EYE SURGERY  2009 and 2010   cataract both eyes  . EYE SURGERY  2012    laser to left eye, chalazion was removed with laser  . KNEE SURGERY Right    medial meniscus and lateral menicus  . MULTIPLE TOOTH EXTRACTIONS    . PARTIAL KNEE ARTHROPLASTY Right 04/08/2018   Procedure: RIGHT UNICOMPARTMENTAL KNEE;  Surgeon: Dorna Leitz, MD;  [facility];  Service: Orthopedics;  Laterality: Right;  . TONSILLECTOMY  87 yrs old    There were no vitals filed for this visit.   Subjective Assessment - 11/07/20 1303    Subjective Pretty good, just have not been very active                             [facility] Adult PT Treatment/Exercise - 11/07/20 0001      Ambulation/Gait   Gait Comments gait outside negotiating curbs and in the grass, up and down a slope      High Level Balance   High Level Balance Comments walking two laps ball toss, bounce, stepping forward over a half roll, side stepping over a half roll, on airex head turns      Knee/Hip Exercises: Standing   Heel Raises Both;10 reps    Heel Raises Limitations 3#    Hip Flexion Both;10 reps    Hip Flexion  Limitations 3#    Hip Abduction Both;10 reps    Abduction Limitations 3#    Hip Extension Both;10 reps    Extension Limitations 3#    Forward Step Up 5 reps;Step Height: 6"    Step Down Step Height: 6";5 reps                    PT Short Term Goals - 10/03/20 1054      PT SHORT TERM GOAL #1   Title independent with intiial HEP    Baseline doing elliptical 20 min at home added LE ex    Status Achieved             PT Long Term Goals - 11/07/20 1424      PT LONG TERM GOAL #1   Title Pt will be I with advanced HEP    Status Achieved      PT LONG TERM GOAL #3   Baseline 17 sec without AD- slow to get started  ( hesitates with initial gait)    Status Partially Met      PT LONG TERM GOAL #4   Title Pt will report able to rise from recliner with minimal difficulty    Status Partially Met                 Plan - 11/07/20 1422    Clinical Impression  Statement Patient starts out very stiff and small shufffling steps, she will loosen up and then seems to be able to be a little more sure and then is able to take longer steps.  She struggles with the step ups and downs and on the curbs outside.  On the dynamic surfaces airex and grass, she is unsteady and needs CGA and will require min A to correct LOB    PT Next Visit Plan continue to work on the balance and functional gait    Consulted and Agree with Plan of Care Patient           Patient will benefit from skilled therapeutic intervention in order to improve the following deficits and impairments:  Abnormal gait,Difficulty walking,Decreased endurance,Decreased activity tolerance,Decreased balance,Decreased mobility,Decreased strength,Postural dysfunction  Visit Diagnosis: Difficulty in walking, not elsewhere classified  Muscle weakness (generalized)  Abnormality of gait due to impairment of balance     Problem List Patient Active Problem List   Diagnosis Date Noted  . Primary osteoarthritis of right knee 04/08/2018  . Malignant neoplasm of upper-outer quadrant of right breast in female, estrogen receptor positive ([place]) 06/29/2017  . Unspecified vitamin D deficiency 11/27/2013  . Hematuria 10/31/2013  . Physical exam, annual 08/02/2013  . Other and unspecified hyperlipidemia 07/31/2013  . Diabetes mellitus without complication ([place])   . Hypertension   . Arthritis     Sumner Boast., PT 11/07/2020, 2:25 PM  [place]. [place], [place], [address] Phone: [phone]   Fax:  [phone]  Name: Kristen Hill MRN: [id number] Date of Birth: [birth year]

## 2020-11-12 ENCOUNTER — Ambulatory Visit: Payer: Medicare PPO | Admitting: Physical Therapy

## 2020-11-12 ENCOUNTER — Encounter: Payer: Self-pay | Admitting: Physical Therapy

## 2020-11-12 ENCOUNTER — Other Ambulatory Visit: Payer: Self-pay

## 2020-11-12 DIAGNOSIS — M6281 Muscle weakness (generalized): Secondary | ICD-10-CM | POA: Diagnosis not present

## 2020-11-12 DIAGNOSIS — R262 Difficulty in walking, not elsewhere classified: Secondary | ICD-10-CM

## 2020-11-12 DIAGNOSIS — R2689 Other abnormalities of gait and mobility: Secondary | ICD-10-CM | POA: Diagnosis not present

## 2020-11-12 NOTE — Therapy (Signed)
[place]. [place], [place], [address] Phone: [phone]   Fax:  [phone]  Physical Therapy Treatment  Patient Details  Name: Kristen Hill MRN: [id number] Date of Birth: Oct 29, 1925 Referring Provider (PT): Dorina Hoyer Date: 11/12/2020   PT End of Session - 11/12/20 1652    Visit Number 18    Number of Visits 25    Date for PT Re-Evaluation 12/13/20    Authorization Type Hum[employer]    PT Start Time 1440    PT Stop Time 1522    PT Time Calculation (min) 42 min    Activity Tolerance Patient tolerated treatment well    Behavior During Therapy [facility] for tasks assessed/performed           Past Medical History:  Diagnosis Date  . Aortic atherosclerosis ([place])   . Cancer [facility])    right breast  . Diabetes mellitus without complication ([place]) 8786   type 2  . Glaucoma    left eye  . Hematuria 10/31/2013  . History of external beam radiation therapy 07/21/17- 08/11/17   Right Breast 2.66 Gy X 16 fractions  . Hypertension   . Jaundice    in 1947  . Jaundice 1946  . Measles as a child  . Multinodular goiter   . Mumps as a child  . OA (osteoarthritis)    right knee, torn meniscus lateral and medial  . Other and unspecified hyperlipidemia 07/31/2013  . Personal history of radiation therapy   . Physical exam, annual 08/02/2013   Sees Dr Trinna Post at [facility] Dr Farris Has, Retinal specialist Dermatolgy  . Thyroid nodule   . Wears glasses   . Wears partial dentures     Past Surgical History:  Procedure Laterality Date  . BREAST LUMPECTOMY Right 06/14/2017  . BREAST LUMPECTOMY WITH RADIOACTIVE SEED LOCALIZATION Right 06/14/2017   Procedure: [place] BREAST LUMPECTOMY WITH RADIOACTIVE SEED LOCALIZATION ERAS PATHWAY;  Surgeon: Jovita Kussmaul, MD;  Location: [place];  Service: General;  Laterality: Right;  . COLONOSCOPY  09/2015  . EYE SURGERY  2009 and 2010   cataract both eyes  . EYE SURGERY  2012    laser to left eye, chalazion was removed with laser  . KNEE SURGERY Right    medial meniscus and lateral menicus  . MULTIPLE TOOTH EXTRACTIONS    . PARTIAL KNEE ARTHROPLASTY Right 04/08/2018   Procedure: RIGHT UNICOMPARTMENTAL KNEE;  Surgeon: Dorna Leitz, MD;  [facility];  Service: Orthopedics;  Laterality: Right;  . TONSILLECTOMY  87 yrs old    There were no vitals filed for this visit.   Subjective Assessment - 11/12/20 1448    Subjective Patient reports that she had a fall during the super bowl, she reports she had a handful of chips and was trying to step around her daughters feet and she lost her balance, she fell forward and landed on the couch, she reports no injury except to her pride    Currently in Pain? No/denies                             [facility] Adult PT Treatment/Exercise - 11/12/20 0001      Ambulation/Gait   Gait Comments stairs 4"and 6" using both hand rails, some gait outside with a SPC, curb negotiatiions      High Level Balance   High Level Balance Activities Direction changes;Side stepping;Backward walking  High Level Balance Comments walking two laps ball toss, bounce, stepping forward over a half roll, side stepping over a half roll, on airex head turns, in the open no device and light HHA 4" step up and down simulating a curb, ball kicks      Knee/Hip Exercises: Aerobic   Nustep L 5 6 min                    PT Short Term Goals - 10/03/20 1054      PT SHORT TERM GOAL #1   Title independent with intiial HEP    Baseline doing elliptical 20 min at home added LE ex    Status Achieved             PT Long Term Goals - 11/07/20 1424      PT LONG TERM GOAL #1   Title Pt will be I with advanced HEP    Status Achieved      PT LONG TERM GOAL #3   Baseline 17 sec without AD- slow to get started  ( hesitates with initial gait)    Status Partially Met      PT LONG TERM GOAL #4   Title Pt will report able to rise from  recliner with minimal difficulty    Status Partially Met                 Plan - 11/12/20 1652    Clinical Impression Statement Patient reports a fall trying to avoid her daughter's feet while carrying items, she fell onto the couch so there was no injury, she does report some fear and a little sore.  She continued to have the hesitation with steps and curbs, she was a little more unsure of herself today, I really tried to work on her balance    PT Next Visit Plan continue to work on the balance and functional gait    Consulted and Agree with Plan of Care Patient           Patient will benefit from skilled therapeutic intervention in order to improve the following deficits and impairments:  Abnormal gait,Difficulty walking,Decreased endurance,Decreased activity tolerance,Decreased balance,Decreased mobility,Decreased strength,Postural dysfunction  Visit Diagnosis: Difficulty in walking, not elsewhere classified  Muscle weakness (generalized)  Abnormality of gait due to impairment of balance     Problem List Patient Active Problem List   Diagnosis Date Noted  . Primary osteoarthritis of right knee 04/08/2018  . Malignant neoplasm of upper-outer quadrant of right breast in female, estrogen receptor positive ([place]) 06/29/2017  . Unspecified vitamin D deficiency 11/27/2013  . Hematuria 10/31/2013  . Physical exam, annual 08/02/2013  . Other and unspecified hyperlipidemia 07/31/2013  . Diabetes mellitus without complication ([place])   . Hypertension   . Arthritis     Sumner Boast., PT 11/12/2020, 4:54 PM  [address]. [place], [place], [address] Phone: [phone]   Fax:  [phone]  Name: Kristen Hill MRN: [id number] Date of Birth: 11/25/1925

## 2020-11-14 ENCOUNTER — Other Ambulatory Visit: Payer: Self-pay

## 2020-11-14 ENCOUNTER — Ambulatory Visit: Payer: Medicare PPO | Admitting: Physical Therapy

## 2020-11-14 DIAGNOSIS — M6281 Muscle weakness (generalized): Secondary | ICD-10-CM | POA: Diagnosis not present

## 2020-11-14 DIAGNOSIS — R2689 Other abnormalities of gait and mobility: Secondary | ICD-10-CM | POA: Diagnosis not present

## 2020-11-14 DIAGNOSIS — R262 Difficulty in walking, not elsewhere classified: Secondary | ICD-10-CM | POA: Diagnosis not present

## 2020-11-14 NOTE — Therapy (Signed)
[address]. [place], [place], [address] Phone: [phone]   Fax:  [phone]  Physical Therapy Treatment  Patient Details  Name: Kristen Hill MRN: [id number] Date of Birth: 1925-11-19 Referring Provider (PT): Dorina Hoyer Date: 11/14/2020   PT End of Session - 11/14/20 1600    Visit Number 19    Number of Visits 25    Date for PT Re-Evaluation 12/13/20    Authorization Type Hum[employer]    PT Start Time 1520    PT Stop Time 1605    PT Time Calculation (min) 45 min           Past Medical History:  Diagnosis Date  . Aortic atherosclerosis ([place])   . Cancer [facility])    right breast  . Diabetes mellitus without complication ([place]) 4917   type 2  . Glaucoma    left eye  . Hematuria 10/31/2013  . History of external beam radiation therapy 07/21/17- 08/11/17   Right Breast 2.66 Gy X 16 fractions  . Hypertension   . Jaundice    in 1947  . Jaundice 1946  . Measles as a child  . Multinodular goiter   . Mumps as a child  . OA (osteoarthritis)    right knee, torn meniscus lateral and medial  . Other and unspecified hyperlipidemia 07/31/2013  . Personal history of radiation therapy   . Physical exam, annual 08/02/2013   Sees Dr Trinna Post at [facility] Dr Farris Has, Retinal specialist Dermatolgy  . Thyroid nodule   . Wears glasses   . Wears partial dentures     Past Surgical History:  Procedure Laterality Date  . BREAST LUMPECTOMY Right 06/14/2017  . BREAST LUMPECTOMY WITH RADIOACTIVE SEED LOCALIZATION Right 06/14/2017   Procedure: [place] BREAST LUMPECTOMY WITH RADIOACTIVE SEED LOCALIZATION ERAS PATHWAY;  Surgeon: Jovita Kussmaul, MD;  Location: [place];  Service: General;  Laterality: Right;  . COLONOSCOPY  09/2015  . EYE SURGERY  2009 and 2010   cataract both eyes  . EYE SURGERY  2012   laser to left eye, chalazion was removed with laser  . KNEE SURGERY Right    medial meniscus and lateral  menicus  . MULTIPLE TOOTH EXTRACTIONS    . PARTIAL KNEE ARTHROPLASTY Right 04/08/2018   Procedure: RIGHT UNICOMPARTMENTAL KNEE;  Surgeon: Dorna Leitz, MD;  [facility];  Service: Orthopedics;  Laterality: Right;  . TONSILLECTOMY  87 yrs old    There were no vitals filed for this visit.   Subjective Assessment - 11/14/20 1524    Subjective doing okay- just need my balance better    Currently in Pain? No/denies                             [facility] Adult PT Treatment/Exercise - 11/14/20 0001      High Level Balance   High Level Balance Activities Side stepping;Backward walking;Tandem walking;Marching forwards;Negotiating over obstacles;Negotitating around obstacles    High Level Balance Comments on foam beam   ball toss on beam- min A     Knee/Hip Exercises: Aerobic   Nustep L 5 6 min      Knee/Hip Exercises: Machines for Strengthening   Cybex Knee Extension 5# 3x10    Cybex Knee Flexion 20# 3x10      Knee/Hip Exercises: Standing   Forward Step Up 5 reps;Step Height: 6"   HHA   Step Down Step  Height: 6";5 reps   HHA   Walking with Sports Cord 30# 3 x fwd/back and SW min A    Other Standing Knee Exercises Alt 6in box taps 2x10   HHA                   PT Short Term Goals - 10/03/20 1054      PT SHORT TERM GOAL #1   Title independent with intiial HEP    Baseline doing elliptical 20 min at home added LE ex    Status Achieved             PT Long Term Goals - 11/14/20 1559      PT LONG TERM GOAL #2   Title Pt will demo Berg Balance Score 45/56 in order to decrease risk of falls    Status Partially Met      PT LONG TERM GOAL #3   Title Pt will demo BLE hip flexion/extension strength 4+/5    Baseline test well but fatigues quickly    Status Partially Met      PT LONG TERM GOAL #4   Title Pt will report able to rise from recliner with minimal difficulty    Status Partially Met      PT LONG TERM GOAL #5   Title walk without cane for most  distances    Status Partially Met                 Plan - 11/14/20 1601    Clinical Impression Statement pt still hesitant with balance actvities esp with initial mvmt. left leg weaker than RT, MMT well but fatigues quickly with actvity    PT Treatment/Interventions ADLs/Self Care Home Management;Electrical Stimulation;Moist Heat;Gait training;Stair training;Functional mobility training;Therapeutic activities;Therapeutic exercise;Balance training;Neuromuscular re-education;Manual techniques;Patient/family education    PT Next Visit Plan continue to work on the balance and functional gait           Patient will benefit from skilled therapeutic intervention in order to improve the following deficits and impairments:  Abnormal gait,Difficulty walking,Decreased endurance,Decreased activity tolerance,Decreased balance,Decreased mobility,Decreased strength,Postural dysfunction  Visit Diagnosis: Difficulty in walking, not elsewhere classified  Muscle weakness (generalized)  Abnormality of gait due to impairment of balance     Problem List Patient Active Problem List   Diagnosis Date Noted  . Primary osteoarthritis of right knee 04/08/2018  . Malignant neoplasm of upper-outer quadrant of right breast in female, estrogen receptor positive ([place]) 06/29/2017  . Unspecified vitamin D deficiency 11/27/2013  . Hematuria 10/31/2013  . Physical exam, annual 08/02/2013  . Other and unspecified hyperlipidemia 07/31/2013  . Diabetes mellitus without complication ([place])   . Hypertension   . Arthritis     Kristen Hill,[name] PTA 11/14/2020, 4:04 PM  [place]. [place], [place], [address] Phone: [phone]   Fax:  [phone]  Name: Kristen Hill MRN: [id number] Date of Birth: Oct 18, 1925

## 2020-11-19 ENCOUNTER — Other Ambulatory Visit: Payer: Self-pay

## 2020-11-19 ENCOUNTER — Ambulatory Visit: Payer: Medicare PPO | Admitting: Physical Therapy

## 2020-11-19 DIAGNOSIS — M6281 Muscle weakness (generalized): Secondary | ICD-10-CM | POA: Diagnosis not present

## 2020-11-19 DIAGNOSIS — R262 Difficulty in walking, not elsewhere classified: Secondary | ICD-10-CM | POA: Diagnosis not present

## 2020-11-19 DIAGNOSIS — R2689 Other abnormalities of gait and mobility: Secondary | ICD-10-CM | POA: Diagnosis not present

## 2020-11-19 NOTE — Therapy (Signed)
[place]. [place], [place], [address] Phone: [phone]   Fax:  [phone]  Physical Therapy Treatment Progress Note Reporting Period 10/15/2020 to 11/19/2020  See note below for Objective Data and Assessment of Progress/Goals.      Patient Details  Name: Kristen Hill MRN: [id number] Date of Birth: [birth year] Referring Provider (PT): Dorina Hoyer Date: 11/19/2020   PT End of Session - 11/19/20 1347    Visit Number 20    Date for PT Re-Evaluation 12/13/20    PT Start Time 1300    PT Stop Time 7741    PT Time Calculation (min) 47 min           Past Medical History:  Diagnosis Date  . Aortic atherosclerosis ([place])   . Cancer [facility])    right breast  . Diabetes mellitus without complication ([place]) 2878   type 2  . Glaucoma    left eye  . Hematuria 10/31/2013  . History of external beam radiation therapy 07/21/17- 08/11/17   Right Breast 2.66 Gy X 16 fractions  . Hypertension   . Jaundice    in 1947  . Jaundice 1946  . Measles as a child  . Multinodular goiter   . Mumps as a child  . OA (osteoarthritis)    right knee, torn meniscus lateral and medial  . Other and unspecified hyperlipidemia 07/31/2013  . Personal history of radiation therapy   . Physical exam, annual 08/02/2013   Sees Dr Trinna Post at [facility] Dr Farris Has, Retinal specialist Dermatolgy  . Thyroid nodule   . Wears glasses   . Wears partial dentures     Past Surgical History:  Procedure Laterality Date  . BREAST LUMPECTOMY Right 06/14/2017  . BREAST LUMPECTOMY WITH RADIOACTIVE SEED LOCALIZATION Right 06/14/2017   Procedure: [place] BREAST LUMPECTOMY WITH RADIOACTIVE SEED LOCALIZATION ERAS PATHWAY;  Surgeon: Jovita Kussmaul, MD;  Location: [facility];  Service: General;  Laterality: Right;  . COLONOSCOPY  09/2015  . EYE SURGERY  2009 and 2010   cataract both eyes  . EYE SURGERY  2012   laser to left eye, chalazion was  removed with laser  . KNEE SURGERY Right    medial meniscus and lateral menicus  . MULTIPLE TOOTH EXTRACTIONS    . PARTIAL KNEE ARTHROPLASTY Right 04/08/2018   Procedure: RIGHT UNICOMPARTMENTAL KNEE;  Surgeon: Dorna Leitz, MD;  [facility];  Service: Orthopedics;  Laterality: Right;  . TONSILLECTOMY  87 yrs old    There were no vitals filed for this visit.   Subjective Assessment - 11/19/20 1300    Subjective balance is so-so, good and bad moments with it, Sunday was rough    Currently in Pain? No/denies                             [facility] Adult PT Treatment/Exercise - 11/19/20 0001      High Level Balance   High Level Balance Activities Tandem walking;Backward walking;Side stepping    High Level Balance Comments on foam beam- min A   walking ball toss- difficult with high toss     Knee/Hip Exercises: Aerobic   Nustep L 5 6 min      Knee/Hip Exercises: Machines for Strengthening   Cybex Knee Extension 5# 3x10    Cybex Knee Flexion 20# 3x10      Knee/Hip Exercises: Standing   Forward Step Up  Both;5 reps;Hand Hold: 1;Step Height: 4"   HHA   Step Down Both;5 reps;Hand Hold: 1;Step Height: 4"   HHA min A- attempted 6 inch but unable   Other Standing Knee Exercises Alt 6in box taps 2x 20    Other Standing Knee Exercises stepping over and back foam roll 2 sets 5 with SPC and HHA      Knee/Hip Exercises: Seated   Sit to Sand 2 sets;5 reps;without UE support   feet on airex                   PT Short Term Goals - 10/03/20 1054      PT SHORT TERM GOAL #1   Title independent with intiial HEP    Baseline doing elliptical 20 min at home added LE ex    Status Achieved             PT Long Term Goals - 11/19/20 1349      PT LONG TERM GOAL #1   Title Pt will be I with advanced HEP    Status Achieved      PT LONG TERM GOAL #2   Title Pt will demo Berg Balance Score 45/56 in order to decrease risk of falls    Status Partially Met      PT LONG  TERM GOAL #3   Title Pt will demo BLE hip flexion/extension strength 4+/5    Baseline test well but fatigues quickly    Status Partially Met      PT LONG TERM GOAL #4   Title Pt will report able to rise from recliner with minimal difficulty    Status Partially Met      PT LONG TERM GOAL #5   Title walk without cane for most distances    Status Partially Met                 Plan - 11/19/20 1347    Clinical Impression Statement increased diffiuclty picking up feet today with step taps and step overs, requiring assistance and cuing. ball toss balance with gait did well except with high ball toss LOB.    PT Treatment/Interventions ADLs/Self Care Home Management;Electrical Stimulation;Moist Heat;Gait training;Stair training;Functional mobility training;Therapeutic activities;Therapeutic exercise;Balance training;Neuromuscular re-education;Manual techniques;Patient/family education    PT Next Visit Plan continue to work on the balance and functional gait           Patient will benefit from skilled therapeutic intervention in order to improve the following deficits and impairments:  Abnormal gait,Difficulty walking,Decreased endurance,Decreased activity tolerance,Decreased balance,Decreased mobility,Decreased strength,Postural dysfunction  Visit Diagnosis: Muscle weakness (generalized)  Difficulty in walking, not elsewhere classified  Abnormality of gait due to impairment of balance     Problem List Patient Active Problem List   Diagnosis Date Noted  . Primary osteoarthritis of right knee 04/08/2018  . Malignant neoplasm of upper-outer quadrant of right breast in female, estrogen receptor positive ([place]) 06/29/2017  . Unspecified vitamin D deficiency 11/27/2013  . Hematuria 10/31/2013  . Physical exam, annual 08/02/2013  . Other and unspecified hyperlipidemia 07/31/2013  . Diabetes mellitus without complication ([place])   . Hypertension   . Arthritis    Amador Cunas, PT,  DPT Ej Pinson,[name] PTA 11/19/2020, 1:51 PM  [place]. [place], [place], [address] Phone: [phone]   Fax:  [phone]  Name: Kristen Hill MRN: [id number] Date of Birth: 1925-10-27

## 2020-11-21 ENCOUNTER — Encounter: Payer: Medicare PPO | Admitting: Physical Therapy

## 2020-11-21 ENCOUNTER — Encounter: Payer: Self-pay | Admitting: Physical Therapy

## 2020-11-21 ENCOUNTER — Ambulatory Visit: Payer: Medicare PPO | Admitting: Physical Therapy

## 2020-11-21 ENCOUNTER — Other Ambulatory Visit: Payer: Self-pay

## 2020-11-21 DIAGNOSIS — R262 Difficulty in walking, not elsewhere classified: Secondary | ICD-10-CM | POA: Diagnosis not present

## 2020-11-21 DIAGNOSIS — R2689 Other abnormalities of gait and mobility: Secondary | ICD-10-CM

## 2020-11-21 DIAGNOSIS — M6281 Muscle weakness (generalized): Secondary | ICD-10-CM

## 2020-11-21 NOTE — Therapy (Signed)
[name] [place], [place], [address] Phone: [phone]   Fax:  [phone]  Physical Therapy Treatment  Patient Details  Name: Kristen Hill MRN: [id number] Date of Birth: [birth year] Referring Provider (PT): Dorina Hoyer Date: 11/21/2020   PT End of Session - 11/21/20 1712    Visit Number 21    Number of Visits 25    Date for PT Re-Evaluation 12/13/20    Authorization Type [employer]    PT Start Time 1522    PT Stop Time 1608    PT Time Calculation (min) 46 min    Activity Tolerance Patient tolerated treatment well    Behavior During Therapy [facility] for tasks assessed/performed           Past Medical History:  Diagnosis Date  . Aortic atherosclerosis ([place])   . Cancer [facility])    right breast  . Diabetes mellitus without complication ([place]) 7628   type 2  . Glaucoma    left eye  . Hematuria 10/31/2013  . History of external beam radiation therapy 07/21/17- 08/11/17   Right Breast 2.66 Gy X 16 fractions  . Hypertension   . Jaundice    in 1947  . Jaundice 1946  . Measles as a child  . Multinodular goiter   . Mumps as a child  . OA (osteoarthritis)    right knee, torn meniscus lateral and medial  . Other and unspecified hyperlipidemia 07/31/2013  . Personal history of radiation therapy   . Physical exam, annual 08/02/2013   Sees Dr Trinna Post at [facility] Dr Farris Has, Retinal specialist Dermatolgy  . Thyroid nodule   . Wears glasses   . Wears partial dentures     Past Surgical History:  Procedure Laterality Date  . BREAST LUMPECTOMY Right 06/14/2017  . BREAST LUMPECTOMY WITH RADIOACTIVE SEED LOCALIZATION Right 06/14/2017   Procedure: [place] BREAST LUMPECTOMY WITH RADIOACTIVE SEED LOCALIZATION ERAS PATHWAY;  Surgeon: Jovita Kussmaul, MD;  Location: [place];  Service: General;  Laterality: Right;  . COLONOSCOPY  09/2015  . EYE SURGERY  2009 and 2010   cataract both eyes  . EYE SURGERY  2012    laser to left eye, chalazion was removed with laser  . KNEE SURGERY Right    medial meniscus and lateral menicus  . MULTIPLE TOOTH EXTRACTIONS    . PARTIAL KNEE ARTHROPLASTY Right 04/08/2018   Procedure: RIGHT UNICOMPARTMENTAL KNEE;  Surgeon: Dorna Leitz, MD;  [facility];  Service: Orthopedics;  Laterality: Right;  . TONSILLECTOMY  87 yrs old    There were no vitals filed for this visit.   Subjective Assessment - 11/21/20 1528    Subjective I did more walking and activity on Tuesday.   Tired but no real issues    Currently in Pain? No/denies              [facility] PT Assessment - 11/21/20 0001      Timed Up and Go Test   Normal TUG (seconds) 16                         [facility] Adult PT Treatment/Exercise - 11/21/20 0001      High Level Balance   High Level Balance Activities Tandem walking;Backward walking;Side stepping;Direction changes    High Level Balance Comments walking ball toss, picking up objects      Knee/Hip Exercises: Aerobic   Nustep L 5 6 min  Other Aerobic 7# farmers carry each arm one lap, ths really fatigued her.      Knee/Hip Exercises: Seated   Other Seated Knee/Hip Exercises sit to stand 1# in each hand overhead press                    PT Short Term Goals - 10/03/20 1054      PT SHORT TERM GOAL #1   Title independent with intiial HEP    Baseline doing elliptical 20 min at home added LE ex    Status Achieved             PT Long Term Goals - 11/19/20 1349      PT LONG TERM GOAL #1   Title Pt will be I with advanced HEP    Status Achieved      PT LONG TERM GOAL #2   Title Pt will demo Berg Balance Score 45/56 in order to decrease risk of falls    Status Partially Met      PT LONG TERM GOAL #3   Title Pt will demo BLE hip flexion/extension strength 4+/5    Baseline test well but fatigues quickly    Status Partially Met      PT LONG TERM GOAL #4   Title Pt will report able to rise from recliner with  minimal difficulty    Status Partially Met      PT LONG TERM GOAL #5   Title walk without cane for most distances    Status Partially Met                 Plan - 11/21/20 1713    Clinical Impression Statement I changed some things today, had her pickup items and negotiate around items while doing, I added a farmer's carry, this really fatigued her, her TUG was a little better but she still really struggles with her first few steps and then with balance she has a lot of hesitation    PT Next Visit Plan continue to work on the balance and functional gait    Consulted and Agree with Plan of Care Patient           Patient will benefit from skilled therapeutic intervention in order to improve the following deficits and impairments:  Abnormal gait,Difficulty walking,Decreased endurance,Decreased activity tolerance,Decreased balance,Decreased mobility,Decreased strength,Postural dysfunction  Visit Diagnosis: Muscle weakness (generalized)  Difficulty in walking, not elsewhere classified  Abnormality of gait due to impairment of balance     Problem List Patient Active Problem List   Diagnosis Date Noted  . Primary osteoarthritis of right knee 04/08/2018  . Malignant neoplasm of upper-outer quadrant of right breast in female, estrogen receptor positive ([place]) 06/29/2017  . Unspecified vitamin D deficiency 11/27/2013  . Hematuria 10/31/2013  . Physical exam, annual 08/02/2013  . Other and unspecified hyperlipidemia 07/31/2013  . Diabetes mellitus without complication ([place])   . Hypertension   . Arthritis     Sumner Boast., PT 11/21/2020, 5:15 PM  [address]. [place], [place], [address] Phone: [phone]   Fax:  [phone]  Name: Kristen Hill MRN: [id number] Date of Birth: March 03, 1926

## 2020-11-26 ENCOUNTER — Ambulatory Visit: Payer: Medicare PPO | Attending: Internal Medicine | Admitting: Physical Therapy

## 2020-11-26 ENCOUNTER — Encounter: Payer: Self-pay | Admitting: Physical Therapy

## 2020-11-26 ENCOUNTER — Other Ambulatory Visit: Payer: Self-pay

## 2020-11-26 DIAGNOSIS — R262 Difficulty in walking, not elsewhere classified: Secondary | ICD-10-CM | POA: Insufficient documentation

## 2020-11-26 DIAGNOSIS — R2689 Other abnormalities of gait and mobility: Secondary | ICD-10-CM | POA: Insufficient documentation

## 2020-11-26 DIAGNOSIS — M6281 Muscle weakness (generalized): Secondary | ICD-10-CM | POA: Insufficient documentation

## 2020-11-26 NOTE — Therapy (Signed)
[place]. [place], [place], [address] Phone: [phone]   Fax:  [phone]  Physical Therapy Treatment  Patient Details  Name: Kristen Hill MRN: [id number] Date of Birth: 1925-10-07 Referring Provider (PT): Dorina Hoyer Date: 11/26/2020   PT End of Session - 11/26/20 1638    Visit Number 22    Number of Visits 25    Date for PT Re-Evaluation 12/13/20    Authorization Type [employer]    PT Start Time 1410    PT Stop Time 1450    PT Time Calculation (min) 40 min    Activity Tolerance Patient tolerated treatment well    Behavior During Therapy [facility] for tasks assessed/performed           Past Medical History:  Diagnosis Date  . Aortic atherosclerosis ([place])   . Cancer [facility])    right breast  . Diabetes mellitus without complication ([place]) 0762   type 2  . Glaucoma    left eye  . Hematuria 10/31/2013  . History of external beam radiation therapy 07/21/17- 08/11/17   Right Breast 2.66 Gy X 16 fractions  . Hypertension   . Jaundice    in 1947  . Jaundice 1946  . Measles as a child  . Multinodular goiter   . Mumps as a child  . OA (osteoarthritis)    right knee, torn meniscus lateral and medial  . Other and unspecified hyperlipidemia 07/31/2013  . Personal history of radiation therapy   . Physical exam, annual 08/02/2013   Se[facility] Dr Trinna Post at [facility] Dr Farris Has, Retinal specialist Dermatolgy  . Thyroid nodule   . Wears glasses   . Wears partial dentures     Past Surgical History:  Procedure Laterality Date  . BREAST LUMPECTOMY Right 06/14/2017  . BREAST LUMPECTOMY WITH RADIOACTIVE SEED LOCALIZATION Right 06/14/2017   Procedure: [place] BREAST LUMPECTOMY WITH RADIOACTIVE SEED LOCALIZATION ERAS PATHWAY;  Surgeon: Jovita Kussmaul, MD;  Location: [place];  Service: General;  Laterality: Right;  . COLONOSCOPY  09/2015  . EYE SURGERY  2009 and 2010   cataract both eyes  . EYE SURGERY  2012    laser to left eye, chalazion was removed with laser  . KNEE SURGERY Right    medial meniscus and lateral menicus  . MULTIPLE TOOTH EXTRACTIONS    . PARTIAL KNEE ARTHROPLASTY Right 04/08/2018   Procedure: RIGHT UNICOMPARTMENTAL KNEE;  Surgeon: Dorna Leitz, MD;  [facility];  Service: Orthopedics;  Laterality: Right;  . TONSILLECTOMY  87 yrs old    There were no vitals filed for this visit.   Subjective Assessment - 11/26/20 1429    Subjective "Running late forgot what time the appointment was", no falls                             [facility] Adult PT Treatment/Exercise - 11/26/20 0001      Ambulation/Gait   Gait Comments gait around the back building 2 breaks coming up the hill, had her negotiate curbs, really still hesitates going up the curb      High Level Balance   High Level Balance Activities Tandem walking;Backward walking;Side stepping;Direction changes    High Level Balance Comments walking ball toss, picking up objects, on airex toe touches, airex ball toss, head turns, reaching      Knee/Hip Exercises: Aerobic   Other Aerobic 7# farmers carry each  arm one lap                    PT Short Term Goals - 10/03/20 1054      PT SHORT TERM GOAL #1   Title independent with intiial HEP    Baseline doing elliptical 20 min at home added LE ex    Status Achieved             PT Long Term Goals - 11/19/20 1349      PT LONG TERM GOAL #1   Title Pt will be I with advanced HEP    Status Achieved      PT LONG TERM GOAL #2   Title Pt will demo Berg Balance Score 45/56 in order to decrease risk of falls    Status Partially Met      PT LONG TERM GOAL #3   Title Pt will demo BLE hip flexion/extension strength 4+/5    Baseline test well but fatigues quickly    Status Partially Met      PT LONG TERM GOAL #4   Title Pt will report able to rise from recliner with minimal difficulty    Status Partially Met      PT LONG TERM GOAL #5   Title  walk without cane for most distances    Status Partially Met                 Plan - 11/26/20 1639    Clinical Impression Statement We challenged her today with a longer walk, having to go up and hill and negotiate curbs, she still really hesitates going up, can go down with less hesitancy.  The airex really bothers her, she does demonstrate ankle, hip and step strategies to correct loss of balance.  She did get fatigued on the walk, having to rest 2x on walking up the hill.    PT Next Visit Plan continue to work on the balance and functional gait    Consulted and Agree with Plan of Care Patient           Patient will benefit from skilled therapeutic intervention in order to improve the following deficits and impairments:  Abnormal gait,Difficulty walking,Decreased endurance,Decreased activity tolerance,Decreased balance,Decreased mobility,Decreased strength,Postural dysfunction  Visit Diagnosis: Muscle weakness (generalized)  Difficulty in walking, not elsewhere classified  Abnormality of gait due to impairment of balance     Problem List Patient Active Problem List   Diagnosis Date Noted  . Primary osteoarthritis of right knee 04/08/2018  . Malignant neoplasm of upper-outer quadrant of right breast in female, estrogen receptor positive ([place]) 06/29/2017  . Unspecified vitamin D deficiency 11/27/2013  . Hematuria 10/31/2013  . Physical exam, annual 08/02/2013  . Other and unspecified hyperlipidemia 07/31/2013  . Diabetes mellitus without complication ([place])   . Hypertension   . Arthritis     Sumner Boast., PT 11/26/2020, 4:44 PM  [place]. [place], [place], [address] Phone: [phone]   Fax:  [phone]  Name: Kristen Hill MRN: [id number] Date of Birth: 1926/02/13

## 2020-11-28 ENCOUNTER — Ambulatory Visit: Payer: Medicare PPO | Admitting: Physical Therapy

## 2020-11-28 ENCOUNTER — Other Ambulatory Visit: Payer: Self-pay

## 2020-11-28 DIAGNOSIS — R2689 Other abnormalities of gait and mobility: Secondary | ICD-10-CM

## 2020-11-28 DIAGNOSIS — M6281 Muscle weakness (generalized): Secondary | ICD-10-CM

## 2020-11-28 DIAGNOSIS — R262 Difficulty in walking, not elsewhere classified: Secondary | ICD-10-CM | POA: Diagnosis not present

## 2020-11-28 NOTE — Therapy (Signed)
[place]. [place], [place], [address] Phone: [phone]   Fax:  [phone]  Physical Therapy Treatment  Patient Details  Name: Kristen Hill MRN: [id number] Date of Birth: [birth year] Referring Provider (PT): Dorina Hoyer Date: 11/28/2020   PT End of Session - 11/28/20 1513    Visit Number 23    Number of Visits 25    Date for PT Re-Evaluation 12/13/20    PT Start Time 9741    PT Stop Time 1440    PT Time Calculation (min) 45 min           Past Medical History:  Diagnosis Date  . Aortic atherosclerosis ([place])   . Cancer [facility])    right breast  . Diabetes mellitus without complication ([place]) 6384   type 2  . Glaucoma    left eye  . Hematuria 10/31/2013  . History of external beam radiation therapy 07/21/17- 08/11/17   Right Breast 2.66 Gy X 16 fractions  . Hypertension   . Jaundice    in 1947  . Jaundice 1946  . Measles as a child  . Multinodular goiter   . Mumps as a child  . OA (osteoarthritis)    right knee, torn meniscus lateral and medial  . Other and unspecified hyperlipidemia 07/31/2013  . Personal history of radiation therapy   . Physical exam, annual 08/02/2013   Sees Dr Trinna Post at [facility] Dr Farris Has, Retinal specialist Dermatolgy  . Thyroid nodule   . Wears glasses   . Wears partial dentures     Past Surgical History:  Procedure Laterality Date  . BREAST LUMPECTOMY Right 06/14/2017  . BREAST LUMPECTOMY WITH RADIOACTIVE SEED LOCALIZATION Right 06/14/2017   Procedure: [place] BREAST LUMPECTOMY WITH RADIOACTIVE SEED LOCALIZATION ERAS PATHWAY;  Surgeon: Jovita Kussmaul, MD;  Location: [place];  Service: General;  Laterality: Right;  . COLONOSCOPY  09/2015  . EYE SURGERY  2009 and 2010   cataract both eyes  . EYE SURGERY  2012   laser to left eye, chalazion was removed with laser  . KNEE SURGERY Right    medial meniscus and lateral menicus  . MULTIPLE TOOTH  EXTRACTIONS    . PARTIAL KNEE ARTHROPLASTY Right 04/08/2018   Procedure: RIGHT UNICOMPARTMENTAL KNEE;  Surgeon: Dorna Leitz, MD;  [facility];  Service: Orthopedics;  Laterality: Right;  . TONSILLECTOMY  87 yrs old    There were no vitals filed for this visit.   Subjective Assessment - 11/28/20 1503    Subjective doing pretty good    Currently in Pain? No/denies                             [facility] Adult PT Treatment/Exercise - 11/28/20 0001      Ambulation/Gait   Gait Comments gait outside all surfaces, transitioning and curbs- cuing for safety and tech with curbs -getting closerprior to stepping   resisted gait working on balance and righting reaction as well as work on [employer] operational officer and sitting on Pball- balance and righting reaction and correction with CGA - min A and cuing     Knee/Hip Exercises: Machines for Strengthening   Cybex Knee Extension 5# 3x10    Cybex Knee Flexion 20# 3x10      Knee/Hip Exercises: Standing   Heel Raises Both;15 reps   black bar and toe raises   Other Standing Knee Exercises Alt 6in  box taps 2x 20    Other Standing Knee Exercises stepping on and over 6 ich box HHA 1ox      Knee/Hip Exercises: Seated   Sit to Sand 10 reps;without UE support   on aCGA irex with ball toss                   PT Short Term Goals - 10/03/20 1054      PT SHORT TERM GOAL #1   Title independent with intiial HEP    Baseline doing elliptical 20 min at home added LE ex    Status Achieved             PT Long Term Goals - 11/28/20 1512      PT LONG TERM GOAL #2   Title Pt will demo Berg Balance Score 45/56 in order to decrease risk of falls    Status Partially Met      PT LONG TERM GOAL #3   Title Pt will demo BLE hip flexion/extension strength 4+/5    Baseline test well but fatigues quickly    Status Partially Met      PT LONG TERM GOAL #4   Title Pt will report able to rise from recliner with minimal difficulty    Baseline with  UE    Status Partially Met      PT LONG TERM GOAL #5   Title walk without cane for most distances    Baseline on level surfaces inside, not confident outside    Status Partially Met                 Plan - 11/28/20 1513    Clinical Impression Statement working on gait and ex to work righing reactions to improve balacne and self correction,outisde not confident with AD dn needs cuing to get close with navigating curb . Pt still unsure on uneven terrain. progressing with goals    PT Treatment/Interventions ADLs/Self Care Home Management;Electrical Stimulation;Moist Heat;Gait training;Stair training;Functional mobility training;Therapeutic activities;Therapeutic exercise;Balance training;Neuromuscular re-education;Manual techniques;Patient/family education    PT Next Visit Plan continue to work on the balance and functional gait           Patient will benefit from skilled therapeutic intervention in order to improve the following deficits and impairments:  Abnormal gait,Difficulty walking,Decreased endurance,Decreased activity tolerance,Decreased balance,Decreased mobility,Decreased strength,Postural dysfunction  Visit Diagnosis: Difficulty in walking, not elsewhere classified  Abnormality of gait due to impairment of balance  Muscle weakness (generalized)     Problem List Patient Active Problem List   Diagnosis Date Noted  . Primary osteoarthritis of right knee 04/08/2018  . Malignant neoplasm of upper-outer quadrant of right breast in female, estrogen receptor positive ([place]) 06/29/2017  . Unspecified vitamin D deficiency 11/27/2013  . Hematuria 10/31/2013  . Physical exam, annual 08/02/2013  . Other and unspecified hyperlipidemia 07/31/2013  . Diabetes mellitus without complication ([place])   . Hypertension   . Arthritis     Ledora Delker,[name] PTA 11/28/2020, 3:15 PM  [place]. [place], [place],  [address] Phone: [phone]   Fax:  [phone]  Name: Kei Langhorst MRN: [id number] Date of Birth: 85-Mar-1927

## 2020-12-03 ENCOUNTER — Other Ambulatory Visit: Payer: Self-pay

## 2020-12-03 ENCOUNTER — Ambulatory Visit: Payer: Medicare PPO | Admitting: Physical Therapy

## 2020-12-03 DIAGNOSIS — R262 Difficulty in walking, not elsewhere classified: Secondary | ICD-10-CM

## 2020-12-03 DIAGNOSIS — M6281 Muscle weakness (generalized): Secondary | ICD-10-CM | POA: Diagnosis not present

## 2020-12-03 DIAGNOSIS — R2689 Other abnormalities of gait and mobility: Secondary | ICD-10-CM | POA: Diagnosis not present

## 2020-12-03 NOTE — Therapy (Signed)
[place]. [place], [place], [address] Phone: [phone]   Fax:  [phone]  Physical Therapy Treatment  Patient Details  Name: Kristen Hill MRN: [id number] Date of Birth: 12/25/1925 Referring Provider (PT): Dorina Hoyer Date: 12/03/2020   PT End of Session - 12/03/20 1058    Visit Number 24    Number of Visits 25    Date for PT Re-Evaluation 12/13/20    Authorization Type [employer]    PT Start Time 1055    PT Stop Time 1141    PT Time Calculation (min) 46 min           Past Medical History:  Diagnosis Date  . Aortic atherosclerosis ([place])   . Cancer [facility])    right breast  . Diabetes mellitus without complication ([place]) 0737   type 2  . Glaucoma    left eye  . Hematuria 10/31/2013  . History of external beam radiation therapy 07/21/17- 08/11/17   Right Breast 2.66 Gy X 16 fractions  . Hypertension   . Jaundice    in 1947  . Jaundice 1946  . Measles as a child  . Multinodular goiter   . Mumps as a child  . OA (osteoarthritis)    right knee, torn meniscus lateral and medial  . Other and unspecified hyperlipidemia 07/31/2013  . Personal history of radiation therapy   . Physical exam, annual 08/02/2013   Sees Dr Trinna Post at [facility] Dr Farris Has, Retinal specialist Dermatolgy  . Thyroid nodule   . Wears glasses   . Wears partial dentures     Past Surgical History:  Procedure Laterality Date  . BREAST LUMPECTOMY Right 06/14/2017  . BREAST LUMPECTOMY WITH RADIOACTIVE SEED LOCALIZATION Right 06/14/2017   Procedure: [place] BREAST LUMPECTOMY WITH RADIOACTIVE SEED LOCALIZATION ERAS PATHWAY;  Surgeon: Jovita Kussmaul, MD;  Location: [place];  Service: General;  Laterality: Right;  . COLONOSCOPY  09/2015  . EYE SURGERY  2009 and 2010   cataract both eyes  . EYE SURGERY  2012   laser to left eye, chalazion was removed with laser  . KNEE SURGERY Right    medial meniscus and lateral menicus   . MULTIPLE TOOTH EXTRACTIONS    . PARTIAL KNEE ARTHROPLASTY Right 04/08/2018   Procedure: RIGHT UNICOMPARTMENTAL KNEE;  Surgeon: Dorna Leitz, MD;  [facility];  Service: Orthopedics;  Laterality: Right;  . TONSILLECTOMY  87 yrs old    There were no vitals filed for this visit.   Subjective Assessment - 12/03/20 1104    Subjective denies falls and stumbles    Currently in Pain? No/denies                             [facility] Adult PT Treatment/Exercise - 12/03/20 0001      High Level Balance   High Level Balance Activities --   foam mat marching fwd and back,side stepping and tandem gait     Knee/Hip Exercises: Aerobic   Nustep L 6 6 min      Knee/Hip Exercises: Machines for Strengthening   Cybex Knee Extension 5# 3x10    Cybex Knee Flexion 20# 3x10      Knee/Hip Exercises: Standing   Other Standing Knee Exercises Alt 6in box taps 2x 20   3# steping fwd and back and laterally over foam roll 10 x each HHA   Other Standing Knee Exercises  stepping on and over 6 ich box HHA 5 x each   alt march,hip flex,ext and abd on airex HHA 20 x     Knee/Hip Exercises: Seated   Long Arc Quad AAROM    Sit to [employer] 10 reps;without UE support   on air ex                   PT Short Term Goals - 10/03/20 1054      PT SHORT TERM GOAL #1   Title independent with intiial HEP    Baseline doing elliptical 20 min at home added LE ex    Status Achieved             PT Long Term Goals - 12/03/20 1139      PT LONG TERM GOAL #1   Title Pt will be I with advanced HEP    Status Achieved      PT LONG TERM GOAL #2   Title Pt will demo Berg Balance Score 45/56 in order to decrease risk of falls    Baseline 43/56    Status Partially Met      PT LONG TERM GOAL #3   Title Pt will demo BLE hip flexion/extension strength 4+/5    Baseline test well but fatigues quickly    Status Partially Met      PT LONG TERM GOAL #4   Title Pt will report able to rise from recliner  with minimal difficulty    Status Partially Met      PT LONG TERM GOAL #5   Title walk without cane for most distances    Status Partially Met                 Plan - 12/03/20 1138    Clinical Impression Statement pt is progressing with strength and balance but remains a fall risk as noted by her BERG score as well as hesistancy with certain mvmts. pt needs SPC most of the time now. Left LE weaker tahn RT as noted with ex.    PT Treatment/Interventions ADLs/Self Care Home Management;Electrical Stimulation;Moist Heat;Gait training;Stair training;Functional mobility training;Therapeutic activities;Therapeutic exercise;Balance training;Neuromuscular re-education;Manual techniques;Patient/family education    PT Next Visit Plan REASSESS           Patient will benefit from skilled therapeutic intervention in order to improve the following deficits and impairments:  Abnormal gait,Difficulty walking,Decreased endurance,Decreased activity tolerance,Decreased balance,Decreased mobility,Decreased strength,Postural dysfunction  Visit Diagnosis: Difficulty in walking, not elsewhere classified  Abnormality of gait due to impairment of balance  Muscle weakness (generalized)     Problem List Patient Active Problem List   Diagnosis Date Noted  . Primary osteoarthritis of right knee 04/08/2018  . Malignant neoplasm of upper-outer quadrant of right breast in female, estrogen receptor positive ([place]) 06/29/2017  . Unspecified vitamin D deficiency 11/27/2013  . Hematuria 10/31/2013  . Physical exam, annual 08/02/2013  . Other and unspecified hyperlipidemia 07/31/2013  . Diabetes mellitus without complication ([place])   . Hypertension   . Arthritis     Danniell Rotundo,[name] PTA 12/03/2020, 11:40 AM  [place]. [place], [place], [address] Phone: [phone]   Fax:  [phone]  Name: Kristen Hill MRN: [id number] Date  of Birth: 28-Nov-1925

## 2020-12-05 ENCOUNTER — Ambulatory Visit: Payer: Medicare PPO | Admitting: Physical Therapy

## 2020-12-06 ENCOUNTER — Other Ambulatory Visit: Payer: Self-pay

## 2020-12-06 ENCOUNTER — Encounter (HOSPITAL_BASED_OUTPATIENT_CLINIC_OR_DEPARTMENT_OTHER): Payer: Self-pay

## 2020-12-06 ENCOUNTER — Emergency Department (HOSPITAL_BASED_OUTPATIENT_CLINIC_OR_DEPARTMENT_OTHER): Payer: Medicare PPO

## 2020-12-06 ENCOUNTER — Emergency Department (HOSPITAL_BASED_OUTPATIENT_CLINIC_OR_DEPARTMENT_OTHER)
Admission: EM | Admit: 2020-12-06 | Discharge: 2020-12-06 | Disposition: A | Discharge status: Home / Self Care | Payer: Medicare PPO | Attending: Emergency Medicine | Admitting: Emergency Medicine

## 2020-12-06 DIAGNOSIS — Z7984 Long term (current) use of oral hypoglycemic drugs: Secondary | ICD-10-CM | POA: Insufficient documentation

## 2020-12-06 DIAGNOSIS — N1831 Chronic kidney disease, stage 3a: Secondary | ICD-10-CM | POA: Diagnosis not present

## 2020-12-06 DIAGNOSIS — R609 Edema, unspecified: Secondary | ICD-10-CM | POA: Diagnosis not present

## 2020-12-06 DIAGNOSIS — E119 Type 2 diabetes mellitus without complications: Secondary | ICD-10-CM | POA: Insufficient documentation

## 2020-12-06 DIAGNOSIS — H6122 Impacted cerumen, left ear: Secondary | ICD-10-CM | POA: Diagnosis not present

## 2020-12-06 DIAGNOSIS — Z7982 Long term (current) use of aspirin: Secondary | ICD-10-CM | POA: Diagnosis not present

## 2020-12-06 DIAGNOSIS — Z87891 Personal history of nicotine dependence: Secondary | ICD-10-CM | POA: Diagnosis not present

## 2020-12-06 DIAGNOSIS — M7989 Other specified soft tissue disorders: Secondary | ICD-10-CM | POA: Diagnosis not present

## 2020-12-06 DIAGNOSIS — Z853 Personal history of malignant neoplasm of breast: Secondary | ICD-10-CM | POA: Diagnosis not present

## 2020-12-06 DIAGNOSIS — G5601 Carpal tunnel syndrome, right upper limb: Secondary | ICD-10-CM | POA: Diagnosis not present

## 2020-12-06 DIAGNOSIS — J310 Chronic rhinitis: Secondary | ICD-10-CM | POA: Diagnosis not present

## 2020-12-06 DIAGNOSIS — R600 Localized edema: Secondary | ICD-10-CM | POA: Diagnosis not present

## 2020-12-06 DIAGNOSIS — I129 Hypertensive chronic kidney disease with stage 1 through stage 4 chronic kidney disease, or unspecified chronic kidney disease: Secondary | ICD-10-CM | POA: Diagnosis not present

## 2020-12-06 DIAGNOSIS — I10 Essential (primary) hypertension: Secondary | ICD-10-CM | POA: Diagnosis not present

## 2020-12-06 DIAGNOSIS — Z79899 Other long term (current) drug therapy: Secondary | ICD-10-CM | POA: Diagnosis not present

## 2020-12-06 NOTE — ED Triage Notes (Signed)
Pt c/o left LE swelling x 1 month-seen by PCP today-reports elevated ddimer-pt NAD-to tx area via w/c

## 2020-12-06 NOTE — ED Provider Notes (Signed)
[facility] Provider Note  CSN: [id number] Arrival date & time: 12/06/20 2038    History Chief Complaint  Patient presents with  . Leg Swelling    HPI  Chandlar Staebell is a 87 y.o. female presents for evaluation of BLE swelling ongoing for several weeks. Not associated with SOB, orthopnea or DOE. She reports improvement with legs elevated while in her recliner. She was seen at PCP office and had labs sent. Patient called and advised to come to the ED for evaluation of possible DVT because dimer was elevated.   Past Medical History:  Diagnosis Date  . Aortic atherosclerosis ([place])   . Cancer [facility])    right breast  . Diabetes mellitus without complication ([place]) 2353   type 2  . Glaucoma    left eye  . Hematuria 10/31/2013  . History of external beam radiation therapy 07/21/17- 08/11/17   Right Breast 2.66 Gy X 16 fractions  . Hypertension   . Jaundice    in 1947  . Jaundice 1946  . Measles as a child  . Multinodular goiter   . Mumps as a child  . OA (osteoarthritis)    right knee, torn meniscus lateral and medial  . Other and unspecified hyperlipidemia 07/31/2013  . Personal history of radiation therapy   . Physical exam, annual 08/02/2013   Sees Dr Trinna Post at [facility] Dr Farris Has, Retinal specialist Dermatolgy  . Thyroid nodule   . Wears glasses   . Wears partial dentures     Past Surgical History:  Procedure Laterality Date  . BREAST LUMPECTOMY Right 06/14/2017  . BREAST LUMPECTOMY WITH RADIOACTIVE SEED LOCALIZATION Right 06/14/2017   Procedure: [place] BREAST LUMPECTOMY WITH RADIOACTIVE SEED LOCALIZATION ERAS PATHWAY;  Surgeon: Jovita Kussmaul, MD;  Location: [facility];  Service: General;  Laterality: Right;  . COLONOSCOPY  09/2015  . EYE SURGERY  2009 and 2010   cataract both eyes  . EYE SURGERY  2012   laser to left eye, chalazion was removed with laser  . KNEE SURGERY Right    medial meniscus and lateral menicus  .  MULTIPLE TOOTH EXTRACTIONS    . PARTIAL KNEE ARTHROPLASTY Right 04/08/2018   Procedure: RIGHT UNICOMPARTMENTAL KNEE;  Surgeon: Dorna Leitz, MD;  Location: [facility];  Service: Orthopedics;  Laterality: Right;  . TONSILLECTOMY  87 yrs old    Family History  Problem Relation Age of Onset  . Cancer Mother        ovarian  . Breast cancer Mother   . Diabetes Father   . Kidney disease Father     Social History   Tobacco Use  . Smoking status: Former Smoker    Packs/day: 0.10    Years: 1.00    Pack years: 0.10    Types: Cigarettes    Quit date: 09/28/1950    Years since quitting: 70.2  . Smokeless tobacco: Never Used  Vaping Use  . Vaping Use: Never used  Substance Use Topics  . Alcohol use: No  . Drug use: No     Home Medications Prior to Admission medications   Medication Sig Start Date End Date Taking? Authorizing Provider  amLODipine (NORVASC) 5 MG tablet Take 1 tablet (5 mg total) by mouth daily. 08/28/13   Mosie Lukes, MD  anastrozole (ARIMIDEX) 1 MG tablet Take 1 tablet (1 mg total) daily by mouth. Patient not taking: Reported on 09/14/2017 09/28/17   Nicholas Lose, MD  aspirin EC 325 MG  tablet Take 1 tablet (325 mg total) by mouth 2 (two) times daily after a meal. Take x 1 month post op to decrease risk of blood clots. 04/08/18   Gary Fleet, PA-C  bimatoprost (LUMIGAN) 0.01 % SOLN Place 1 drop into the left eye at bedtime.     [provider]  docusate sodium (COLACE) 100 MG capsule Take 1 capsule (100 mg total) by mouth 2 (two) times daily. 04/08/18   Gary Fleet, PA-C  EPINEPHrine 0.3 mg/0.3 mL IJ SOAJ injection Inject 0.3 mg into the skin once.  02/12/17   [provider]  ferrous sulfate 325 (65 FE) MG tablet Take 325 mg by mouth every other day.    [provider]  glucose blood (BAYER CONTOUR TEST) test strip Check blood sugars q am and prn Patient not taking: Reported on 03/25/2018 08/28/13   Mosie Lukes, MD  HYDROcodone-acetaminophen  (NORCO) 5-325 MG tablet Take 1-2 tablets by mouth every 6 (six) hours as needed for moderate pain. 04/08/18   Gary Fleet, PA-C  metFORMIN (GLUCOPHAGE) 500 MG tablet Take 1 tablet (500 mg total) by mouth 2 (two) times daily with a meal. 08/28/13   Mosie Lukes, MD  MICROLET LANCETS MISC Check BS q am and prn Patient not taking: Reported on 03/25/2018 08/28/13   Mosie Lukes, MD  Multiple Vitamin (MULTIVITAMIN WITH MINERALS) TABS tablet Take 1 tablet by mouth daily.     [provider]  tiZANidine (ZANAFLEX) 2 MG tablet Take 1 tablet (2 mg total) by mouth every 8 (eight) hours as needed for muscle spasms. 04/08/18   Gary Fleet, PA-C  VITAMIN D, ERGOCALCIFEROL, PO Take 1,000 Units by mouth daily.     [provider]     Allergies    Bee venom and Sulfa antibiotics   Review of Systems   Review of Systems A comprehensive review of systems was completed and negative except as noted in HPI.    Physical Exam BP (!) 117/47 (BP Location: Right Arm)   Pulse 78   Temp (!) 97.5 F (36.4 C) (Oral)   Resp 18   Ht 5\' 2"  (1.575 m)   Wt 65.8 kg   SpO2 99%   BMI 26.52 kg/m   Physical Exam Vitals and nursing note reviewed.  Constitutional:      Appearance: Normal appearance.  HENT:     Head: Normocephalic and atraumatic.     Nose: Nose normal.     Mouth/Throat:     Mouth: Mucous membranes are moist.  Eyes:     Extraocular Movements: Extraocular movements intact.     Conjunctiva/sclera: Conjunctivae normal.  Cardiovascular:     Rate and Rhythm: Normal rate.  Pulmonary:     Effort: Pulmonary effort is normal.     Breath sounds: Normal breath sounds.  Abdominal:     General: Abdomen is flat.     Palpations: Abdomen is soft.     Tenderness: There is no abdominal tenderness.  Musculoskeletal:        General: No swelling. Normal range of motion.     Cervical back: Neck supple.     Comments: Trace BLE edema to mid shin  Skin:    General: Skin is warm and dry.   Neurological:     General: No focal deficit present.     Mental Status: She is alert.  Psychiatric:        Mood and Affect: Mood normal.      ED  Results / Procedures / Treatments   Labs (all labs ordered are listed, but only abnormal results are displayed) Labs Reviewed - No data to display  EKG EKG Interpretation  Date/Time:  Friday December 06 2020 21:03:18 EST Ventricular Rate:  91 PR Interval:  144 QRS Duration: 74 QT Interval:  344 QTC Calculation: 423 R Axis:   -27 Text Interpretation: Sinus rhythm with occasional Premature ventricular complexes Otherwise normal ECG No significant change since last tracing Confirmed by Calvert Cantor [phone]) on 12/06/2020 9:34:41 PM    Radiology US Venous Img Lower Unilateral Left  Result Date: 12/06/2020 CLINICAL DATA:  Left ankle swelling x 2-3 weeks. EXAM: LEFT LOWER EXTREMITY VENOUS DOPPLER ULTRASOUND TECHNIQUE: Gray-scale sonography with compression, as well as color and duplex ultrasound, were performed to evaluate the deep venous system(s) from the level of the common femoral vein through the popliteal and proximal calf veins. COMPARISON:  None. FINDINGS: VENOUS Normal compressibility of the LEFT common femoral, superficial femoral, and popliteal veins, as well as the visualized LEFT calf veins. Visualized portions of profunda femoral vein and great saphenous vein unremarkable. No filling defects to suggest DVT on grayscale or color Doppler imaging. Doppler waveforms show normal direction of venous flow, normal respiratory plasticity and response to augmentation. Limited views of the contralateral common femoral vein are unremarkable. OTHER None. Limitations: none IMPRESSION: Negative. Electronically Signed   By: Virgina Norfolk M.D.   On: 12/06/2020 22:26    Procedures Procedures  Medications Ordered in the ED Medications - No data to display   MDM Rules/Calculators/A&P MDM EKG is unremarkable. Doppler is neg for DVT.  Discussed these results with patient, reassured no signs of DVT, CHF or other emergent cause of LE edema. Recommend elevation, compression stockings and PCP follow up.  ED Course  I have reviewed the triage vital signs and the nursing notes.  Pertinent labs & imaging results that were available during my care of the patient were reviewed by me and considered in my medical decision making (see chart for details).     Final Clinical Impression(s) / ED Diagnoses Final diagnoses:  Peripheral edema    Rx / DC Orders ED Discharge Orders    None       Truddie Hidden, MD 12/06/20 2256

## 2020-12-10 ENCOUNTER — Encounter: Payer: Self-pay | Admitting: Physical Therapy

## 2020-12-10 ENCOUNTER — Ambulatory Visit: Payer: Medicare PPO | Admitting: Physical Therapy

## 2020-12-10 ENCOUNTER — Other Ambulatory Visit: Payer: Self-pay

## 2020-12-10 DIAGNOSIS — R262 Difficulty in walking, not elsewhere classified: Secondary | ICD-10-CM

## 2020-12-10 DIAGNOSIS — R2689 Other abnormalities of gait and mobility: Secondary | ICD-10-CM | POA: Diagnosis not present

## 2020-12-10 DIAGNOSIS — M6281 Muscle weakness (generalized): Secondary | ICD-10-CM

## 2020-12-10 NOTE — Therapy (Signed)
[place]. [place], [place], [address] Phone: [phone]   Fax:  [phone]  Physical Therapy Treatment  Patient Details  Name: Kristen Hill MRN: [id number] Date of Birth: Apr 30, 1926 Referring Provider (PT): Dorina Hoyer Date: 12/10/2020   PT End of Session - 12/10/20 1522    Visit Number 85    Number of Visits 85    Date for PT Re-Evaluation 02/13/20    Authorization Type [employer]    PT Start Time 1440    PT Stop Time 1525    PT Time Calculation (min) 45 min    Activity Tolerance Patient tolerated treatment well    Behavior During Therapy [facility] for tasks assessed/performed           Past Medical History:  Diagnosis Date  . Aortic atherosclerosis ([place])   . Cancer [facility])    right breast  . Diabetes mellitus without complication ([place]) 3474   type 2  . Glaucoma    left eye  . Hematuria 10/31/2013  . History of external beam radiation therapy 07/21/17- 08/11/17   Right Breast 2.66 Gy X 16 fractions  . Hypertension   . Jaundice    in 1947  . Jaundice 1946  . Measles as a child  . Multinodular goiter   . Mumps as a child  . OA (osteoarthritis)    right knee, torn meniscus lateral and medial  . Other and unspecified hyperlipidemia 07/31/2013  . Personal history of radiation therapy   . Physical exam, annual 08/02/2013   Sees Dr Trinna Post at [facility] Dr Farris Has, Retinal specialist Dermatolgy  . Thyroid nodule   . Wears glasses   . Wears partial dentures     Past Surgical History:  Procedure Laterality Date  . BREAST LUMPECTOMY Right 06/14/2017  . BREAST LUMPECTOMY WITH RADIOACTIVE SEED LOCALIZATION Right 06/14/2017   Procedure: [place] BREAST LUMPECTOMY WITH RADIOACTIVE SEED LOCALIZATION ERAS PATHWAY;  Surgeon: Jovita Kussmaul, MD;  Location: [facility];  Service: General;  Laterality: Right;  . COLONOSCOPY  09/2015  . EYE SURGERY  2009 and 2010   cataract both eyes  . EYE SURGERY  2012    laser to left eye, chalazion was removed with laser  . KNEE SURGERY Right    medial meniscus and lateral menicus  . MULTIPLE TOOTH EXTRACTIONS    . PARTIAL KNEE ARTHROPLASTY Right 04/08/2018   Procedure: RIGHT UNICOMPARTMENTAL KNEE;  Surgeon: Dorna Leitz, MD;  [facility];  Service: Orthopedics;  Laterality: Right;  . TONSILLECTOMY  87 yrs old    There were no vitals filed for this visit.   Subjective Assessment - 12/10/20 1451    Subjective Reports that she has been trying to do some stuff at the gym, she reports that her biggest issue is her balance and confidence    Currently in Pain? No/denies              [facility] PT Assessment - 12/10/20 0001      Berg Balance Test   Sit to Stand Able to stand without using hands and stabilize independently    Standing Unsupported Able to stand safely 2 minutes    Sitting with Back Unsupported but Feet Supported on Floor or Stool Able to sit safely and securely 2 minutes    Stand to Sit Sits safely with minimal use of hands    Transfers Able to transfer safely, minor use of hands    Standing Unsupported  with Eyes Closed Able to stand 10 seconds safely    Standing Unsupported with Feet Together Able to place feet together independently and stand 1 minute safely    From Standing, Reach Forward with Outstretched Arm Can reach confidently >25 cm (10")    From Standing Position, Pick up Object from [place] to pick up shoe safely and easily    From Standing Position, Turn to Look Behind Over each Shoulder Looks behind one side only/other side shows less weight shift    Turn 360 Degrees Able to turn 360 degrees safely one side only in 4 seconds or less    Standing Unsupported, Alternately Place Feet on Step/Stool Able to stand independently and complete 8 steps >20 seconds    Standing Unsupported, One Foot in [place] to take small step independently and hold 30 seconds    Standing on One Leg Tries to lift leg/unable to hold 3 seconds but  remains standing independently    Total Score 48      Timed Up and Go Test   Normal TUG (seconds) 14                         [facility] Adult PT Treatment/Exercise - 12/10/20 0001      Ambulation/Gait   Gait Comments outside with SPC, picking up cones, then walking to practice curbs, SBA, very hesitant with the curbs, tends to want to hold on for CGA      High Level Balance   High Level Balance Comments walking ball toss, picking up objects, on airex toe touches, airex ball toss, head turns, reaching      Knee/Hip Exercises: Aerobic   Nustep L 6 6 min                    PT Short Term Goals - 10/03/20 1054      PT SHORT TERM GOAL #1   Title independent with intiial HEP    Baseline doing elliptical 20 min at home added LE ex    Status Achieved             PT Long Term Goals - 12/10/20 1737      PT LONG TERM GOAL #1   Title Pt will be I with advanced HEP    Status Achieved      PT LONG TERM GOAL #2   Title Pt will demo Berg Balance Score 45/56 in order to decrease risk of falls    Status Achieved      PT LONG TERM GOAL #3   Title Pt will demo BLE hip flexion/extension strength 4+/5    Status Partially Met      PT LONG TERM GOAL #4   Title Pt will report able to rise from recliner with minimal difficulty    Status Partially Met      PT LONG TERM GOAL #5   Title walk without cane for most distances    Status Partially Met                 Plan - 12/10/20 1522    Clinical Impression Statement Patient continues to progress well, she is improving with the TUG down to 14 seconds and 48/56 on the Berg balance,   She has improved her risk for falls, still some elevation in risk, she still has the biggest issue with contidence and balance.  She is starting to do some walking and going to the  gym some with her daughter which I really have encouraged and think will help.  There is a lot of hesitancy with curbs and higher level balance activities,  tends to want to grab things that are close by    PT Next Visit Plan would like to see 12 more visits over the next 8 visits to maximize her function and safety.    Consulted and Agree with Plan of Care Patient           Patient will benefit from skilled therapeutic intervention in order to improve the following deficits and impairments:  Abnormal gait,Difficulty walking,Decreased endurance,Decreased activity tolerance,Decreased balance,Decreased mobility,Decreased strength,Postural dysfunction  Visit Diagnosis: Difficulty in walking, not elsewhere classified  Abnormality of gait due to impairment of balance  Muscle weakness (generalized)     Problem List Patient Active Problem List   Diagnosis Date Noted  . Primary osteoarthritis of right knee 04/08/2018  . Malignant neoplasm of upper-outer quadrant of right breast in female, estrogen receptor positive ([place]) 06/29/2017  . Unspecified vitamin D deficiency 11/27/2013  . Hematuria 10/31/2013  . Physical exam, annual 08/02/2013  . Other and unspecified hyperlipidemia 07/31/2013  . Diabetes mellitus without complication ([place])   . Hypertension   . Arthritis     Sumner Boast., PT 12/10/2020, 5:38 PM  [place]. [place], [place], [address] Phone: [phone]   Fax:  [phone]  Name: Noemy Hallmon MRN: [id number] Date of Birth: 07-Dec-1925

## 2020-12-18 ENCOUNTER — Other Ambulatory Visit: Payer: Self-pay

## 2020-12-18 ENCOUNTER — Ambulatory Visit: Payer: Medicare PPO | Admitting: Physical Therapy

## 2020-12-18 ENCOUNTER — Encounter: Payer: Self-pay | Admitting: Physical Therapy

## 2020-12-18 DIAGNOSIS — M6281 Muscle weakness (generalized): Secondary | ICD-10-CM

## 2020-12-18 DIAGNOSIS — R2689 Other abnormalities of gait and mobility: Secondary | ICD-10-CM | POA: Diagnosis not present

## 2020-12-18 DIAGNOSIS — R262 Difficulty in walking, not elsewhere classified: Secondary | ICD-10-CM | POA: Diagnosis not present

## 2020-12-18 NOTE — Therapy (Signed)
[place]. [place], [place], [address] Phone: [phone]   Fax:  [phone]  Physical Therapy Treatment  Patient Details  Name: Kristen Hill MRN: [id number] Date of Birth: [birth year] Referring Provider (PT): Dorina Hoyer Date: 12/18/2020   PT End of Session - 12/18/20 1655    Visit Number 26    Number of Visits 36    Date for PT Re-Evaluation 02/13/20    Authorization Type [employer]    PT Start Time 5997    PT Stop Time 85    PT Time Calculation (min) 44 min    Activity Tolerance Patient tolerated treatment well    Behavior During Therapy [facility] for tasks assessed/performed           Past Medical History:  Diagnosis Date  . Aortic atherosclerosis ([place])   . Cancer [facility])    right breast  . Diabetes mellitus without complication ([place]) 7414   type 2  . Glaucoma    left eye  . Hematuria 10/31/2013  . History of external beam radiation therapy 07/21/17- 08/11/17   Right Breast 2.66 Gy X 16 fractions  . Hypertension   . Jaundice    in 1947  . Jaundice 1946  . Measles as a child  . Multinodular goiter   . Mumps as a child  . OA (osteoarthritis)    right knee, torn meniscus lateral and medial  . Other and unspecified hyperlipidemia 07/31/2013  . Personal history of radiation therapy   . Physical exam, annual 08/02/2013   Sees Dr Trinna Post at [facility] Dr Farris Has, Retinal specialist Dermatolgy  . Thyroid nodule   . Wears glasses   . Wears partial dentures     Past Surgical History:  Procedure Laterality Date  . BREAST LUMPECTOMY Right 06/14/2017  . BREAST LUMPECTOMY WITH RADIOACTIVE SEED LOCALIZATION Right 06/14/2017   Procedure: [place] BREAST LUMPECTOMY WITH RADIOACTIVE SEED LOCALIZATION ERAS PATHWAY;  Surgeon: Jovita Kussmaul, MD;  Location: [facility];  Service: General;  Laterality: Right;  . COLONOSCOPY  09/2015  . EYE SURGERY  2009 and 2010   cataract both eyes  . EYE SURGERY  2012    laser to left eye, chalazion was removed with laser  . KNEE SURGERY Right    medial meniscus and lateral menicus  . MULTIPLE TOOTH EXTRACTIONS    . PARTIAL KNEE ARTHROPLASTY Right 04/08/2018   Procedure: RIGHT UNICOMPARTMENTAL KNEE;  Surgeon: Dorna Leitz, MD;  [facility];  Service: Orthopedics;  Laterality: Right;  . TONSILLECTOMY  87 yrs old    There were no vitals filed for this visit.   Subjective Assessment - 12/18/20 1450    Subjective Reports that she is still struggling with steps to the car, she reports balance is not good    Currently in Pain? No/denies                             [facility] Adult PT Treatment/Exercise - 12/18/20 0001      High Level Balance   High Level Balance Activities Side stepping;Backward walking;Direction changes;Sudden stops;Head turns;Negotitating around obstacles;Negotiating over obstacles    High Level Balance Comments walking ball toss, picking up objects, on airex toe touches, airex ball toss, head turns, reaching, ball kicks, standing on airex toss inot box                    PT Short  Term Goals - 10/03/20 1054      PT SHORT TERM GOAL #1   Title independent with intiial HEP    Baseline doing elliptical 20 min at home added LE ex    Status Achieved             PT Long Term Goals - 12/18/20 1700      PT LONG TERM GOAL #1   Title Pt will be I with advanced HEP    Status Achieved      PT LONG TERM GOAL #2   Title Pt will demo Berg Balance Score 45/56 in order to decrease risk of falls    Status Achieved      PT LONG TERM GOAL #3   Title Pt will demo BLE hip flexion/extension strength 4+/5    Status Partially Met                 Plan - 12/18/20 1658    Clinical Impression Statement Patient is still hesitating with curbs and stairs, I had her practice carrying grocery bags and a glass of water while walking and doing stairs.  She seems to have weakness in the left hip that causes some issues  with balance  I feel like she is doing more at home and going to the gym, however the balance still needs work    PT Next Visit Plan work on Special educational needs teacher and Agree with Plan of Care Patient           Patient will benefit from skilled therapeutic intervention in order to improve the following deficits and impairments:  Abnormal gait,Difficulty walking,Decreased endurance,Decreased activity tolerance,Decreased balance,Decreased mobility,Decreased strength,Postural dysfunction  Visit Diagnosis: Difficulty in walking, not elsewhere classified  Abnormality of gait due to impairment of balance  Muscle weakness (generalized)     Problem List Patient Active Problem List   Diagnosis Date Noted  . Primary osteoarthritis of right knee 04/08/2018  . Malignant neoplasm of upper-outer quadrant of right breast in female, estrogen receptor positive ([place]) 06/29/2017  . Unspecified vitamin D deficiency 11/27/2013  . Hematuria 10/31/2013  . Physical exam, annual 08/02/2013  . Other and unspecified hyperlipidemia 07/31/2013  . Diabetes mellitus without complication ([place])   . Hypertension   . Arthritis     Sumner Boast., PT 12/18/2020, 5:01 PM  [place]. [place], [place], [address] Phone: [phone]   Fax:  [phone]  Name: Deanndra Kirley MRN: [id number] Date of Birth: 08/10/1926

## 2020-12-20 ENCOUNTER — Ambulatory Visit: Payer: Medicare PPO | Admitting: Physical Therapy

## 2020-12-20 ENCOUNTER — Other Ambulatory Visit: Payer: Self-pay

## 2020-12-20 DIAGNOSIS — R262 Difficulty in walking, not elsewhere classified: Secondary | ICD-10-CM | POA: Diagnosis not present

## 2020-12-20 DIAGNOSIS — M6281 Muscle weakness (generalized): Secondary | ICD-10-CM

## 2020-12-20 DIAGNOSIS — R2689 Other abnormalities of gait and mobility: Secondary | ICD-10-CM | POA: Diagnosis not present

## 2020-12-20 NOTE — Therapy (Signed)
[place]. [place], [place], [address] Phone: [phone]   Fax:  [phone]  Physical Therapy Treatment  Patient Details  Name: Kristen Hill MRN: [id number] Date of Birth: [birth year] Referring Provider (PT): Dorina Hoyer Date: 12/20/2020   PT End of Session - 12/20/20 1048    Visit Number 27    Number of Visits 36    Date for PT Re-Evaluation 02/13/20    PT Start Time 1008    PT Stop Time 1055    PT Time Calculation (min) 47 min           Past Medical History:  Diagnosis Date  . Aortic atherosclerosis ([place])   . Cancer [facility])    right breast  . Diabetes mellitus without complication ([place]) 4259   type 2  . Glaucoma    left eye  . Hematuria 10/31/2013  . History of external beam radiation therapy 07/21/17- 08/11/17   Right Breast 2.66 Gy X 16 fractions  . Hypertension   . Jaundice    in 1947  . Jaundice 1946  . Measles as a child  . Multinodular goiter   . Mumps as a child  . OA (osteoarthritis)    right knee, torn meniscus lateral and medial  . Other and unspecified hyperlipidemia 07/31/2013  . Personal history of radiation therapy   . Physical exam, annual 08/02/2013   Se[facility] Dr Trinna Post at [facility] Dr Farris Has, Retinal specialist Dermatolgy  . Thyroid nodule   . Wears glasses   . Wears partial dentures     Past Surgical History:  Procedure Laterality Date  . BREAST LUMPECTOMY Right 06/14/2017  . BREAST LUMPECTOMY WITH RADIOACTIVE SEED LOCALIZATION Right 06/14/2017   Procedure: [place] BREAST LUMPECTOMY WITH RADIOACTIVE SEED LOCALIZATION ERAS PATHWAY;  Surgeon: Jovita Kussmaul, MD;  Location: [place];  Service: General;  Laterality: Right;  . COLONOSCOPY  09/2015  . EYE SURGERY  2009 and 2010   cataract both eyes  . EYE SURGERY  2012   laser to left eye, chalazion was removed with laser  . KNEE SURGERY Right    medial meniscus and lateral menicus  . MULTIPLE TOOTH  EXTRACTIONS    . PARTIAL KNEE ARTHROPLASTY Right 04/08/2018   Procedure: RIGHT UNICOMPARTMENTAL KNEE;  Surgeon: Dorna Leitz, MD;  [facility];  Service: Orthopedics;  Laterality: Right;  . TONSILLECTOMY  87 yrs old    There were no vitals filed for this visit.   Subjective Assessment - 12/20/20 1009    Subjective amb in without AD    Currently in Pain? No/denies                             [facility] Adult PT Treatment/Exercise - 12/20/20 0001      Knee/Hip Exercises: Aerobic   Nustep L 6 6 min      Knee/Hip Exercises: Machines for Strengthening   Cybex Knee Extension 5# 3x10    Cybex Knee Flexion 20# 3x10      Knee/Hip Exercises: Standing   SLS with Vectors varies cone taps- min A HHA   unable to do without HHA   Other Standing Knee Exercises Alt 6in box taps 2x 20- CG- min A 2 stumbles   lateral step tap 6 inch 10 times each cg-min A   Other Standing Knee Exercises 4 inch step up without UE 5 x each leg CG- min A  with cuing   step up with LLE weaker     Knee/Hip Exercises: Seated   Sit to Sand 2 sets;5 reps;without UE support   on airex CG-min A, cued to get balance once up before sitting                   PT Short Term Goals - 10/03/20 1054      PT SHORT TERM GOAL #1   Title independent with intiial HEP    Baseline doing elliptical 20 min at home added LE ex    Status Achieved             PT Long Term Goals - 12/18/20 1700      PT LONG TERM GOAL #1   Title Pt will be I with advanced HEP    Status Achieved      PT LONG TERM GOAL #2   Title Pt will demo Berg Balance Score 45/56 in order to decrease risk of falls    Status Achieved      PT LONG TERM GOAL #3   Title Pt will demo BLE hip flexion/extension strength 4+/5    Status Partially Met                 Plan - 12/20/20 1049    Clinical Impression Statement worked alot on [employer] activites to on stepping in car, up steps and curbs- pt very hesistance and needed some  assistance.    PT Treatment/Interventions ADLs/Self Care Home Management;Electrical Stimulation;Moist Heat;Gait training;Stair training;Functional mobility training;Therapeutic activities;Therapeutic exercise;Balance training;Neuromuscular re-education;Manual techniques;Patient/family education    PT Next Visit Plan work on functional balance           Patient will benefit from skilled therapeutic intervention in order to improve the following deficits and impairments:  Abnormal gait,Difficulty walking,Decreased endurance,Decreased activity tolerance,Decreased balance,Decreased mobility,Decreased strength,Postural dysfunction  Visit Diagnosis: Difficulty in walking, not elsewhere classified  Abnormality of gait due to impairment of balance  Muscle weakness (generalized)     Problem List Patient Active Problem List   Diagnosis Date Noted  . Primary osteoarthritis of right knee 04/08/2018  . Malignant neoplasm of upper-outer quadrant of right breast in female, estrogen receptor positive ([place]) 06/29/2017  . Unspecified vitamin D deficiency 11/27/2013  . Hematuria 10/31/2013  . Physical exam, annual 08/02/2013  . Other and unspecified hyperlipidemia 07/31/2013  . Diabetes mellitus without complication ([place])   . Hypertension   . Arthritis     Kristen Hill,[name] PTA 12/20/2020, 10:51 AM  [place]. [place], [place], [address] Phone: [phone]   Fax:  [phone]  Name: Kristen Hill MRN: [id number] Date of Birth: [birth year]

## 2020-12-24 ENCOUNTER — Encounter: Payer: Medicare PPO | Admitting: Physical Therapy

## 2020-12-25 ENCOUNTER — Ambulatory Visit: Payer: Medicare PPO | Admitting: Physical Therapy

## 2020-12-25 ENCOUNTER — Encounter: Payer: Self-pay | Admitting: Physical Therapy

## 2020-12-25 ENCOUNTER — Other Ambulatory Visit: Payer: Self-pay

## 2020-12-25 DIAGNOSIS — R262 Difficulty in walking, not elsewhere classified: Secondary | ICD-10-CM | POA: Diagnosis not present

## 2020-12-25 DIAGNOSIS — R2689 Other abnormalities of gait and mobility: Secondary | ICD-10-CM | POA: Diagnosis not present

## 2020-12-25 DIAGNOSIS — M6281 Muscle weakness (generalized): Secondary | ICD-10-CM

## 2020-12-25 NOTE — Therapy (Signed)
[place]. [place], [place], [address] Phone: [phone]   Fax:  [phone]  Physical Therapy Treatment  Patient Details  Name: Kristen Hill MRN: [id number] Date of Birth: 85/11/1925 Referring Provider (PT): Dorina Hoyer Date: 12/25/2020   PT End of Session - 12/25/20 1704    Visit Number 28    Number of Visits 36    Date for PT Re-Evaluation 02/13/20    Authorization Type [employer]    PT Start Time 9675    PT Stop Time 1603    PT Time Calculation (min) 48 min    Activity Tolerance Patient tolerated treatment well    Behavior During Therapy [facility] for tasks assessed/performed           Past Medical History:  Diagnosis Date  . Aortic atherosclerosis ([place])   . Cancer [facility])    right breast  . Diabetes mellitus without complication ([place]) 9163   type 2  . Glaucoma    left eye  . Hematuria 10/31/2013  . History of external beam radiation therapy 07/21/17- 08/11/17   Right Breast 2.66 Gy X 16 fractions  . Hypertension   . Jaundice    in 1947  . Jaundice 1946  . Measles as a child  . Multinodular goiter   . Mumps as a child  . OA (osteoarthritis)    right knee, torn meniscus lateral and medial  . Other and unspecified hyperlipidemia 07/31/2013  . Personal history of radiation therapy   . Physical exam, annual 08/02/2013   Sees Dr Trinna Post at [facility] Dr Farris Has, Retinal specialist Dermatolgy  . Thyroid nodule   . Wears glasses   . Wears partial dentures     Past Surgical History:  Procedure Laterality Date  . BREAST LUMPECTOMY Right 06/14/2017  . BREAST LUMPECTOMY WITH RADIOACTIVE SEED LOCALIZATION Right 06/14/2017   Procedure: [place] BREAST LUMPECTOMY WITH RADIOACTIVE SEED LOCALIZATION ERAS PATHWAY;  Surgeon: Jovita Kussmaul, MD;  Location: [place];  Service: General;  Laterality: Right;  . COLONOSCOPY  09/2015  . EYE SURGERY  2009 and 2010   cataract both eyes  . EYE SURGERY  2012    laser to left eye, chalazion was removed with laser  . KNEE SURGERY Right    medial meniscus and lateral menicus  . MULTIPLE TOOTH EXTRACTIONS    . PARTIAL KNEE ARTHROPLASTY Right 04/08/2018   Procedure: RIGHT UNICOMPARTMENTAL KNEE;  Surgeon: Dorna Leitz, MD;  [facility];  Service: Orthopedics;  Laterality: Right;  . TONSILLECTOMY  87 yrs old    There were no vitals filed for this visit.   Subjective Assessment - 12/25/20 1525    Subjective Feeling pretty good, still some fear    Currently in Pain? No/denies                             [facility] Adult PT Treatment/Exercise - 12/25/20 0001      Ambulation/Gait   Gait Comments outside with a SPC, uneven terrain, grass, gravel. slopes, curbs, stairs step overstep with one hand rail      High Level Balance   High Level Balance Comments on airex 4" toe touches, on airex throws, standing SLS with SPC cone toe touches, walking ball toss                    PT Short Term Goals - 10/03/20 1054  PT SHORT TERM GOAL #1   Title independent with intiial HEP    Baseline doing elliptical 20 min at home added LE ex    Status Achieved             PT Long Term Goals - 12/25/20 1707      PT LONG TERM GOAL #4   Title Pt will report able to rise from recliner with minimal difficulty    Status Achieved                 Plan - 12/25/20 1705    Clinical Impression Statement Patient still some hesitancy with the curbs, she walks in not using a cane, and on the level surfaces she does well, once we get in the grass and really uneven terrain she really starts to have difficulty, she tends to carry the cane, and needed cues to put the cane down and use it for the tough and uneven spots    PT Next Visit Plan work on functional balance    Consulted and Agree with Plan of Care Patient           Patient will benefit from skilled therapeutic intervention in order to improve the following deficits and  impairments:  Abnormal gait,Difficulty walking,Decreased endurance,Decreased activity tolerance,Decreased balance,Decreased mobility,Decreased strength,Postural dysfunction  Visit Diagnosis: Difficulty in walking, not elsewhere classified  Abnormality of gait due to impairment of balance  Muscle weakness (generalized)     Problem List Patient Active Problem List   Diagnosis Date Noted  . Primary osteoarthritis of right knee 04/08/2018  . Malignant neoplasm of upper-outer quadrant of right breast in female, estrogen receptor positive ([place]) 06/29/2017  . Unspecified vitamin D deficiency 11/27/2013  . Hematuria 10/31/2013  . Physical exam, annual 08/02/2013  . Other and unspecified hyperlipidemia 07/31/2013  . Diabetes mellitus without complication ([place])   . Hypertension   . Arthritis     Sumner Boast., PT 12/25/2020, 5:07 PM  [place]. [place], [place], [address] Phone: [phone]   Fax:  [phone]  Name: Haylen Bellotti MRN: [id number] Date of Birth: Nov 14, 1981

## 2020-12-26 ENCOUNTER — Encounter: Payer: Self-pay | Admitting: Physical Therapy

## 2020-12-26 ENCOUNTER — Ambulatory Visit: Payer: Medicare PPO | Admitting: Physical Therapy

## 2020-12-26 DIAGNOSIS — R262 Difficulty in walking, not elsewhere classified: Secondary | ICD-10-CM

## 2020-12-26 DIAGNOSIS — M6281 Muscle weakness (generalized): Secondary | ICD-10-CM | POA: Diagnosis not present

## 2020-12-26 DIAGNOSIS — R2689 Other abnormalities of gait and mobility: Secondary | ICD-10-CM

## 2020-12-26 NOTE — Therapy (Signed)
[place]. [place], [place], [address] Phone: [phone]   Fax:  [phone]  Physical Therapy Treatment  Patient Details  Name: Kristen Hill MRN: [id number] Date of Birth: May 29, 1926 Referring Provider (PT): Dorina Hoyer Date: 12/26/2020   PT End of Session - 12/26/20 7035    Visit Number 29    Number of Visits 36    Date for PT Re-Evaluation 02/13/20    Authorization Type [employer]    PT Start Time 1350    PT Stop Time 1440    PT Time Calculation (min) 50 min    Activity Tolerance Patient tolerated treatment well    Behavior During Therapy [facility] for tasks assessed/performed           Past Medical History:  Diagnosis Date  . Aortic atherosclerosis ([place])   . Cancer [facility])    right breast  . Diabetes mellitus without complication ([place]) 0093   type 2  . Glaucoma    left eye  . Hematuria 10/31/2013  . History of external beam radiation therapy 07/21/17- 08/11/17   Right Breast 2.66 Gy X 16 fractions  . Hypertension   . Jaundice    in 1947  . Jaundice 1946  . Measles as a child  . Multinodular goiter   . Mumps as a child  . OA (osteoarthritis)    right knee, torn meniscus lateral and medial  . Other and unspecified hyperlipidemia 07/31/2013  . Personal history of radiation therapy   . Physical exam, annual 08/02/2013   Sees Dr Trinna Post at [facility] Dr Farris Has, Retinal specialist Dermatolgy  . Thyroid nodule   . Wears glasses   . Wears partial dentures     Past Surgical History:  Procedure Laterality Date  . BREAST LUMPECTOMY Right 06/14/2017  . BREAST LUMPECTOMY WITH RADIOACTIVE SEED LOCALIZATION Right 06/14/2017   Procedure: [place] BREAST LUMPECTOMY WITH RADIOACTIVE SEED LOCALIZATION ERAS PATHWAY;  Surgeon: Jovita Kussmaul, MD;  Location: [place];  Service: General;  Laterality: Right;  . COLONOSCOPY  09/2015  . EYE SURGERY  2009 and 2010   cataract both eyes  . EYE SURGERY  2012    laser to left eye, chalazion was removed with laser  . KNEE SURGERY Right    medial meniscus and lateral menicus  . MULTIPLE TOOTH EXTRACTIONS    . PARTIAL KNEE ARTHROPLASTY Right 04/08/2018   Procedure: RIGHT UNICOMPARTMENTAL KNEE;  Surgeon: Dorna Leitz, MD;  [facility];  Service: Orthopedics;  Laterality: Right;  . TONSILLECTOMY  87 yrs old    There were no vitals filed for this visit.   Subjective Assessment - 12/26/20 1403    Subjective A little tired    Currently in Pain? No/denies                             [facility] Adult PT Treatment/Exercise - 12/26/20 0001      Ambulation/Gait   Gait Comments fast walking, stairs with one hand rail      High Level Balance   High Level Balance Activities Negotitating around obstacles;Negotiating over obstacles    High Level Balance Comments on airex 4" toe touches, on airex throws, standing SLS with SPC cone toe touches, walking ball toss, aoirex balance beam with SPC, ball kicks, obstacle course                    PT  Short Term Goals - 10/03/20 1054      PT SHORT TERM GOAL #1   Title independent with intiial HEP    Baseline doing elliptical 20 min at home added LE ex    Status Achieved             PT Long Term Goals - 12/26/20 1520      PT LONG TERM GOAL #2   Title Pt will demo Berg Balance Score 45/56 in order to decrease risk of falls    Status Achieved      PT LONG TERM GOAL #3   Title Pt will demo BLE hip flexion/extension strength 4+/5    Status Partially Met                 Plan - 12/26/20 1517    Clinical Impression Statement Patient still struggle swith shange of direction, stairs, curbs and obstacles, she really still hesitates, she is much faster than she was.  I have added more challenging things to do and she does them well just needs CGA    PT Next Visit Plan she has a camping trip coming up this summer and will be on uneven terrain    Consulted and Agree with Plan  of Care Patient           Patient will benefit from skilled therapeutic intervention in order to improve the following deficits and impairments:  Abnormal gait,Difficulty walking,Decreased endurance,Decreased activity tolerance,Decreased balance,Decreased mobility,Decreased strength,Postural dysfunction  Visit Diagnosis: Difficulty in walking, not elsewhere classified  Abnormality of gait due to impairment of balance  Muscle weakness (generalized)     Problem List Patient Active Problem List   Diagnosis Date Noted  . Primary osteoarthritis of right knee 04/08/2018  . Malignant neoplasm of upper-outer quadrant of right breast in female, estrogen receptor positive ([facility]) 06/29/2017  . Unspecified vitamin D deficiency 11/27/2013  . Hematuria 10/31/2013  . Physical exam, annual 08/02/2013  . Other and unspecified hyperlipidemia 07/31/2013  . Diabetes mellitus without complication ([facility])   . Hypertension   . Arthritis     ALBRIGHT,MICHAEL W., PT 12/26/2020, 3:21 PM   [facility] [address] [place], [place], [address] Phone: [phone]   Fax:  [phone]  Name: Kristen Hill MRN: [id number] Date of Birth: 08/10/1926   

## 2020-12-31 ENCOUNTER — Ambulatory Visit: Payer: Medicare PPO | Admitting: Physical Therapy

## 2020-12-31 DIAGNOSIS — M25552 Pain in left hip: Secondary | ICD-10-CM | POA: Diagnosis not present

## 2020-12-31 DIAGNOSIS — M9903 Segmental and somatic dysfunction of lumbar region: Secondary | ICD-10-CM | POA: Diagnosis not present

## 2020-12-31 DIAGNOSIS — M9901 Segmental and somatic dysfunction of cervical region: Secondary | ICD-10-CM | POA: Diagnosis not present

## 2020-12-31 DIAGNOSIS — M9902 Segmental and somatic dysfunction of thoracic region: Secondary | ICD-10-CM | POA: Diagnosis not present

## 2020-12-31 DIAGNOSIS — M62830 Muscle spasm of back: Secondary | ICD-10-CM | POA: Diagnosis not present

## 2020-12-31 DIAGNOSIS — M5117 Intervertebral disc disorders with radiculopathy, lumbosacral region: Secondary | ICD-10-CM | POA: Diagnosis not present

## 2020-12-31 DIAGNOSIS — H8110 Benign paroxysmal vertigo, unspecified ear: Secondary | ICD-10-CM | POA: Diagnosis not present

## 2021-01-02 ENCOUNTER — Ambulatory Visit: Payer: Medicare PPO | Admitting: Physical Therapy

## 2021-01-07 ENCOUNTER — Encounter: Payer: Medicare PPO | Admitting: Physical Therapy

## 2021-01-07 ENCOUNTER — Encounter: Payer: Self-pay | Admitting: Physical Therapy

## 2021-01-07 ENCOUNTER — Other Ambulatory Visit: Payer: Self-pay

## 2021-01-07 ENCOUNTER — Ambulatory Visit: Payer: Medicare PPO | Attending: Internal Medicine | Admitting: Physical Therapy

## 2021-01-07 DIAGNOSIS — Z83511 Family history of glaucoma: Secondary | ICD-10-CM | POA: Diagnosis not present

## 2021-01-07 DIAGNOSIS — R262 Difficulty in walking, not elsewhere classified: Secondary | ICD-10-CM | POA: Diagnosis not present

## 2021-01-07 DIAGNOSIS — E119 Type 2 diabetes mellitus without complications: Secondary | ICD-10-CM | POA: Diagnosis not present

## 2021-01-07 DIAGNOSIS — H35371 Puckering of macula, right eye: Secondary | ICD-10-CM | POA: Diagnosis not present

## 2021-01-07 DIAGNOSIS — R2689 Other abnormalities of gait and mobility: Secondary | ICD-10-CM | POA: Diagnosis not present

## 2021-01-07 DIAGNOSIS — M6281 Muscle weakness (generalized): Secondary | ICD-10-CM | POA: Insufficient documentation

## 2021-01-07 DIAGNOSIS — Z7984 Long term (current) use of oral hypoglycemic drugs: Secondary | ICD-10-CM | POA: Diagnosis not present

## 2021-01-07 DIAGNOSIS — H40052 Ocular hypertension, left eye: Secondary | ICD-10-CM | POA: Diagnosis not present

## 2021-01-07 DIAGNOSIS — H16223 Keratoconjunctivitis sicca, not specified as Sjogren's, bilateral: Secondary | ICD-10-CM | POA: Diagnosis not present

## 2021-01-07 DIAGNOSIS — Z961 Presence of intraocular lens: Secondary | ICD-10-CM | POA: Diagnosis not present

## 2021-01-07 DIAGNOSIS — H26492 Other secondary cataract, left eye: Secondary | ICD-10-CM | POA: Diagnosis not present

## 2021-01-07 DIAGNOSIS — H43813 Vitreous degeneration, bilateral: Secondary | ICD-10-CM | POA: Diagnosis not present

## 2021-01-07 NOTE — Therapy (Signed)
[place]. [place], [place], [address] Phone: [phone]   Fax:  [phone] Progress Note Reporting Period 11/21/20 to 01/07/21 for visit 21-30  See note below for Objective Data and Assessment of Progress/Goals.      Physical Therapy Treatment  Patient Details  Name: Kristen Hill MRN: [id number] Date of Birth: March 26, 1926 Referring Provider (PT): Mina Marble   Encounter Date: 01/07/2021   PT End of Session - 01/07/21 1059    Visit Number 30    Number of Visits 36    Date for PT Re-Evaluation 01/24/21    Authorization Type [employer]    PT Start Time 1013    PT Stop Time 1100    PT Time Calculation (min) 47 min    Activity Tolerance Other (comment)   c/o vertigo last week   Behavior During Therapy [facility] for tasks assessed/performed           Past Medical History:  Diagnosis Date  . Aortic atherosclerosis ([place])   . Cancer [facility])    right breast  . Diabetes mellitus without complication ([place]) 0881   type 2  . Glaucoma    left eye  . Hematuria 10/31/2013  . History of external beam radiation therapy 07/21/17- 08/11/17   Right Breast 2.66 Gy X 16 fractions  . Hypertension   . Jaundice    in 1947  . Jaundice 1946  . Measles as a child  . Multinodular goiter   . Mumps as a child  . OA (osteoarthritis)    right knee, torn meniscus lateral and medial  . Other and unspecified hyperlipidemia 07/31/2013  . Personal history of radiation therapy   . Physical exam, annual 08/02/2013   Sees Dr Trinna Post at [facility] Dr Farris Has, Retinal specialist Dermatolgy  . Thyroid nodule   . Wears glasses   . Wears partial dentures     Past Surgical History:  Procedure Laterality Date  . BREAST LUMPECTOMY Right 06/14/2017  . BREAST LUMPECTOMY WITH RADIOACTIVE SEED LOCALIZATION Right 06/14/2017   Procedure: [place] BREAST LUMPECTOMY WITH RADIOACTIVE SEED LOCALIZATION ERAS PATHWAY;  Surgeon: Jovita Kussmaul, MD;   Location: [place];  Service: General;  Laterality: Right;  . COLONOSCOPY  09/2015  . EYE SURGERY  2009 and 2010   cataract both eyes  . EYE SURGERY  2012   laser to left eye, chalazion was removed with laser  . KNEE SURGERY Right    medial meniscus and lateral menicus  . MULTIPLE TOOTH EXTRACTIONS    . PARTIAL KNEE ARTHROPLASTY Right 04/08/2018   Procedure: RIGHT UNICOMPARTMENTAL KNEE;  Surgeon: Dorna Leitz, MD;  [facility];  Service: Orthopedics;  Laterality: Right;  . TONSILLECTOMY  87 yrs old    There were no vitals filed for this visit.   Subjective Assessment - 01/07/21 1017    Subjective Patient reports that since last Monday she has had some vertigo, she reports having it years ago and has had to take Dramamine, she reports feeling better after about 2-3 days, she comes in today with a SPC the first time in the past month she has used this, she is appearing much more off balance and hesitant with gait                             [facility] Adult PT Treatment/Exercise - 01/07/21 0001      Ambulation/Gait   Gait Comments gait outside  with SPC, negotiating curbs up and down, then in the grass, uneven surfaces with SBA/CGA      High Level Balance   High Level Balance Comments walking ball toss, standing airex throws, aires ball toss, airex tandem walking with SPC and HHA, airex balance bean side stepping, side stepping over small obstacles HHA      Knee/Hip Exercises: Aerobic   Nustep L 5 6 min      Knee/Hip Exercises: Machines for Strengthening   Cybex Knee Extension 5# 2x10    Cybex Knee Flexion 20# 2x10      Knee/Hip Exercises: Seated   Sit to Sand 2 sets;5 reps;without UE support                    PT Short Term Goals - 10/03/20 1054      PT SHORT TERM GOAL #1   Title independent with intiial HEP    Baseline doing elliptical 20 min at home added LE ex    Status Achieved             PT Long Term Goals - 12/26/20 1520      PT LONG  TERM GOAL #2   Title Pt will demo Berg Balance Score 45/56 in order to decrease risk of falls    Status Achieved      PT LONG TERM GOAL #3   Title Pt will demo BLE hip flexion/extension strength 4+/5    Status Partially Met                 Plan - 01/07/21 1100    Clinical Impression Statement Patient was out last week due to vertigo aftet lying on the couch watching basketball game, she reports that her symptoms hasve resolved but is much more cautious and hesitant to walking, she has returned to using a SPC.  She did well with [place] after starting but her first few steps are still slow and small.    PT Next Visit Plan spoke with her about vertigo, if it returns we will ask MD for [place] to treat    Consulted and Agree with Plan of Care Patient           Patient will benefit from skilled therapeutic intervention in order to improve the following deficits and impairments:  Abnormal gait,Difficulty walking,Decreased endurance,Decreased activity tolerance,Decreased balance,Decreased mobility,Decreased strength,Postural dysfunction  Visit Diagnosis: Difficulty in walking, not elsewhere classified  Abnormality of gait due to impairment of balance  Muscle weakness (generalized)     Problem List Patient Active Problem List   Diagnosis Date Noted  . Primary osteoarthritis of right knee 04/08/2018  . Malignant neoplasm of upper-outer quadrant of right breast in female, estrogen receptor positive ([place]) 06/29/2017  . Unspecified vitamin D deficiency 11/27/2013  . Hematuria 10/31/2013  . Physical exam, annual 08/02/2013  . Other and unspecified hyperlipidemia 07/31/2013  . Diabetes mellitus without complication ([place])   . Hypertension   . Arthritis     Sumner Boast., PT 01/07/2021, 11:15 AM  [place]. [place], [place], [address] Phone: [phone]   Fax:  [phone]  Name: Kristen Hill MRN:  [id number] Date of Birth: 1925-11-07

## 2021-01-09 ENCOUNTER — Encounter: Payer: Self-pay | Admitting: Physical Therapy

## 2021-01-09 ENCOUNTER — Ambulatory Visit: Payer: Medicare PPO | Admitting: Physical Therapy

## 2021-01-09 ENCOUNTER — Other Ambulatory Visit: Payer: Self-pay

## 2021-01-09 DIAGNOSIS — M6281 Muscle weakness (generalized): Secondary | ICD-10-CM

## 2021-01-09 DIAGNOSIS — R262 Difficulty in walking, not elsewhere classified: Secondary | ICD-10-CM | POA: Diagnosis not present

## 2021-01-09 DIAGNOSIS — R2689 Other abnormalities of gait and mobility: Secondary | ICD-10-CM | POA: Diagnosis not present

## 2021-01-09 NOTE — Therapy (Signed)
[place]. [place], [place], [address] Phone: [phone]   Fax:  [phone]  Physical Therapy Treatment  Patient Details  Name: Kristen Hill MRN: [id number] Date of Birth: 06/20/1926 Referring Provider (PT): Dorina Hoyer Date: 01/09/2021   PT End of Session - 01/09/21 9485    Visit Number 31    Number of Visits 36    Date for PT Re-Evaluation 01/24/21    Authorization Type [employer]    PT Start Time 4627    PT Stop Time 0350    PT Time Calculation (min) 46 min    Activity Tolerance Patient tolerated treatment well    Behavior During Therapy [facility] for tasks assessed/performed           Past Medical History:  Diagnosis Date  . Aortic atherosclerosis ([place])   . Cancer [facility])    right breast  . Diabetes mellitus without complication ([place]) 0938   type 2  . Glaucoma    left eye  . Hematuria 10/31/2013  . History of external beam radiation therapy 07/21/17- 08/11/17   Right Breast 2.66 Gy X 16 fractions  . Hypertension   . Jaundice    in 1947  . Jaundice 1946  . Measles as a child  . Multinodular goiter   . Mumps as a child  . OA (osteoarthritis)    right knee, torn meniscus lateral and medial  . Other and unspecified hyperlipidemia 07/31/2013  . Personal history of radiation therapy   . Physical exam, annual 08/02/2013   Sees Dr Trinna Post at [facility] Dr Farris Has, Retinal specialist Dermatolgy  . Thyroid nodule   . Wears glasses   . Wears partial dentures     Past Surgical History:  Procedure Laterality Date  . BREAST LUMPECTOMY Right 06/14/2017  . BREAST LUMPECTOMY WITH RADIOACTIVE SEED LOCALIZATION Right 06/14/2017   Procedure: [place] BREAST LUMPECTOMY WITH RADIOACTIVE SEED LOCALIZATION ERAS PATHWAY;  Surgeon: Jovita Kussmaul, MD;  Location: [place];  Service: General;  Laterality: Right;  . COLONOSCOPY  09/2015  . EYE SURGERY  2009 and 2010   cataract both eyes  . EYE SURGERY  2012    laser to left eye, chalazion was removed with laser  . KNEE SURGERY Right    medial meniscus and lateral menicus  . MULTIPLE TOOTH EXTRACTIONS    . PARTIAL KNEE ARTHROPLASTY Right 04/08/2018   Procedure: RIGHT UNICOMPARTMENTAL KNEE;  Surgeon: Dorna Leitz, MD;  [facility];  Service: Orthopedics;  Laterality: Right;  . TONSILLECTOMY  87 yrs old    There were no vitals filed for this visit.   Subjective Assessment - 01/09/21 1312    Subjective Less dizziness, feeling a little better, still hesitant                             [facility] Adult PT Treatment/Exercise - 01/09/21 0001      Ambulation/Gait   Gait Comments gait outside with [facility], negotiating curbs up and down, then in the grass, uneven surfaces with SBA/CGA      High Level Balance   High Level Balance Activities Negotitating around obstacles;Negotiating over obstacles    High Level Balance Comments walking ball toss, standing airex throws, aires ball toss, airex tandem walking with SPC and HHA, airex balance bean side stepping, side stepping over small obstacles HHA  PT Education - 01/09/21 1358    Education Details Issued higher level balance activities with changing BOS, eyes open, closed and head turns, also some foam stanidng    Person(s) Educated Patient    Methods Explanation;Demonstration;Verbal cues;Handout    Comprehension Verbalized understanding            PT Short Term Goals - 10/03/20 1054      PT SHORT TERM GOAL #1   Title independent with intiial HEP    Baseline doing elliptical 20 min at home added LE ex    Status Achieved             PT Long Term Goals - 01/09/21 1532      PT LONG TERM GOAL #1   Title Pt will be I with advanced HEP    Status Achieved      PT LONG TERM GOAL #2   Title Pt will demo Berg Balance Score 45/56 in order to decrease risk of falls    Status Achieved      PT LONG TERM GOAL #3   Title Pt will demo BLE hip  flexion/extension strength 4+/5    Status Partially Met      PT LONG TERM GOAL #4   Title Pt will report able to rise from recliner with minimal difficulty    Status Achieved                 Plan - 01/09/21 1400    Clinical Impression Statement Patient reports that she does not want to use a cane or walking stick, she brought one in today and I had her practice with this, she tends to carry it, I talked with her about its use and what it is for, she reports I just don't want to use it, talked about taking it and using while camping or on uneven surfaces.  She does well with this cue but mostl of the rimes just wants to carry it, reporting I don't want to use it    PT Next Visit Plan vertigo is better, still at times is unsafe    Consulted and Agree with Plan of Care Patient           Patient will benefit from skilled therapeutic intervention in order to improve the following deficits and impairments:  Abnormal gait,Difficulty walking,Decreased endurance,Decreased activity tolerance,Decreased balance,Decreased mobility,Decreased strength,Postural dysfunction  Visit Diagnosis: Difficulty in walking, not elsewhere classified  Abnormality of gait due to impairment of balance  Muscle weakness (generalized)     Problem List Patient Active Problem List   Diagnosis Date Noted  . Primary osteoarthritis of right knee 04/08/2018  . Malignant neoplasm of upper-outer quadrant of right breast in female, estrogen receptor positive ([place]) 06/29/2017  . Unspecified vitamin D deficiency 11/27/2013  . Hematuria 10/31/2013  . Physical exam, annual 08/02/2013  . Other and unspecified hyperlipidemia 07/31/2013  . Diabetes mellitus without complication ([place])   . Hypertension   . Arthritis     Sumner Boast., PT 01/09/2021, 3:32 PM  [place]. [place], [place], [address] Phone: [phone]   Fax:   [phone]  Name: Kristen Hill MRN: [id number] Date of Birth: April 12, 1926

## 2021-01-14 ENCOUNTER — Ambulatory Visit: Payer: Medicare PPO | Admitting: Physical Therapy

## 2021-01-14 ENCOUNTER — Encounter: Payer: Self-pay | Admitting: Physical Therapy

## 2021-01-14 ENCOUNTER — Other Ambulatory Visit: Payer: Self-pay

## 2021-01-14 DIAGNOSIS — M6281 Muscle weakness (generalized): Secondary | ICD-10-CM

## 2021-01-14 DIAGNOSIS — R2689 Other abnormalities of gait and mobility: Secondary | ICD-10-CM | POA: Diagnosis not present

## 2021-01-14 DIAGNOSIS — R262 Difficulty in walking, not elsewhere classified: Secondary | ICD-10-CM | POA: Diagnosis not present

## 2021-01-14 NOTE — Therapy (Signed)
[place]. [place], [place], [address] Phone: [phone]   Fax:  [phone]  Physical Therapy Treatment  Patient Details  Name: Kristen Hill MRN: [id number] Date of Birth: [birth year] Referring Provider (PT): Dorina Hoyer Date: 01/14/2021   PT End of Session - 01/14/21 1353    Visit Number 32    Number of Visits 36    Date for PT Re-Evaluation 01/24/21    Authorization Type [employer]    PT Start Time 2831    PT Stop Time 1354    PT Time Calculation (min) 46 min    Activity Tolerance Patient tolerated treatment well    Behavior During Therapy [facility] for tasks assessed/performed           Past Medical History:  Diagnosis Date  . Aortic atherosclerosis ([place])   . Cancer [facility])    right breast  . Diabetes mellitus without complication ([place]) 5176   type 2  . Glaucoma    left eye  . Hematuria 10/31/2013  . History of external beam radiation therapy 07/21/17- 08/11/17   Right Breast 2.66 Gy X 16 fractions  . Hypertension   . Jaundice    in 1947  . Jaundice 1946  . Measles as a child  . Multinodular goiter   . Mumps as a child  . OA (osteoarthritis)    right knee, torn meniscus lateral and medial  . Other and unspecified hyperlipidemia 07/31/2013  . Personal history of radiation therapy   . Physical exam, annual 08/02/2013   Sees Dr Trinna Post at [facility] Dr Farris Has, Retinal specialist Dermatolgy  . Thyroid nodule   . Wears glasses   . Wears partial dentures     Past Surgical History:  Procedure Laterality Date  . BREAST LUMPECTOMY Right 06/14/2017  . BREAST LUMPECTOMY WITH RADIOACTIVE SEED LOCALIZATION Right 06/14/2017   Procedure: [place] BREAST LUMPECTOMY WITH RADIOACTIVE SEED LOCALIZATION ERAS PATHWAY;  Surgeon: Jovita Kussmaul, MD;  Location: [place];  Service: General;  Laterality: Right;  . COLONOSCOPY  09/2015  . EYE SURGERY  2009 and 2010   cataract both eyes  . EYE SURGERY  2012    laser to left eye, chalazion was removed with laser  . KNEE SURGERY Right    medial meniscus and lateral menicus  . MULTIPLE TOOTH EXTRACTIONS    . PARTIAL KNEE ARTHROPLASTY Right 04/08/2018   Procedure: RIGHT UNICOMPARTMENTAL KNEE;  Surgeon: Dorna Leitz, MD;  [facility];  Service: Orthopedics;  Laterality: Right;  . TONSILLECTOMY  87 yrs old    There were no vitals filed for this visit.   Subjective Assessment - 01/14/21 1308    Subjective Dizziness seems to be resolved, reports went to church the first time in over 2 years.    Currently in Pain? No/denies                             [facility] Adult PT Treatment/Exercise - 01/14/21 0001      Ambulation/Gait   Gait Comments stairs step over step up and down, difficulty going down on the 6" side, gait outside uneven terrain, grass slopes and curbs      High Level Balance   High Level Balance Activities Negotitating around obstacles;Negotiating over obstacles    High Level Balance Comments walking ball toss, on airex ball toss, head turns, eyes closed, ball kicks      Knee/Hip  Exercises: Aerobic   Recumbent Bike L 2 6 min      Knee/Hip Exercises: Machines for Strengthening   Cybex Knee Extension 5# 3x10    Cybex Knee Flexion 20# 3x10      Knee/Hip Exercises: Standing   Hip Flexion Both;2 sets;10 reps    Hip Flexion Limitations 3#    Hip Abduction Both;2 sets;10 reps    Abduction Limitations 3#    Hip Extension Both;2 sets;10 reps    Extension Limitations 3#                    PT Short Term Goals - 10/03/20 1054      PT SHORT TERM GOAL #1   Title independent with intiial HEP    Baseline doing elliptical 20 min at home added LE ex    Status Achieved             PT Long Term Goals - 01/09/21 1532      PT LONG TERM GOAL #1   Title Pt will be I with advanced HEP    Status Achieved      PT LONG TERM GOAL #2   Title Pt will demo Berg Balance Score 45/56 in order to decrease risk of  falls    Status Achieved      PT LONG TERM GOAL #3   Title Pt will demo BLE hip flexion/extension strength 4+/5    Status Partially Met      PT LONG TERM GOAL #4   Title Pt will report able to rise from recliner with minimal difficulty    Status Achieved                 Plan - 01/14/21 1354    Clinical Impression Statement Patient was able to return to church without much difficulty, we went over the start of education for her going to gym and using machines, she is less hesitant than last week, but still issue is the curbs of stepping up and or off of a step    PT Next Visit Plan work on her plan after we d/c           Patient will benefit from skilled therapeutic intervention in order to improve the following deficits and impairments:  Abnormal gait,Difficulty walking,Decreased endurance,Decreased activity tolerance,Decreased balance,Decreased mobility,Decreased strength,Postural dysfunction  Visit Diagnosis: Difficulty in walking, not elsewhere classified  Abnormality of gait due to impairment of balance  Muscle weakness (generalized)     Problem List Patient Active Problem List   Diagnosis Date Noted  . Primary osteoarthritis of right knee 04/08/2018  . Malignant neoplasm of upper-outer quadrant of right breast in female, estrogen receptor positive ([place]) 06/29/2017  . Unspecified vitamin D deficiency 11/27/2013  . Hematuria 10/31/2013  . Physical exam, annual 08/02/2013  . Other and unspecified hyperlipidemia 07/31/2013  . Diabetes mellitus without complication ([place])   . Hypertension   . Arthritis     Sumner Boast., PT 01/14/2021, 1:56 PM  [place]. [place], [place], [address] Phone: [phone]   Fax:  [phone]  Name: Kristen Hill MRN: [id number] Date of Birth: 1925/12/07

## 2021-01-20 ENCOUNTER — Ambulatory Visit: Payer: Medicare PPO | Admitting: Physical Therapy

## 2021-01-20 ENCOUNTER — Encounter: Payer: Self-pay | Admitting: Physical Therapy

## 2021-01-20 ENCOUNTER — Other Ambulatory Visit: Payer: Self-pay

## 2021-01-20 DIAGNOSIS — R262 Difficulty in walking, not elsewhere classified: Secondary | ICD-10-CM | POA: Diagnosis not present

## 2021-01-20 DIAGNOSIS — M6281 Muscle weakness (generalized): Secondary | ICD-10-CM | POA: Diagnosis not present

## 2021-01-20 DIAGNOSIS — R2689 Other abnormalities of gait and mobility: Secondary | ICD-10-CM

## 2021-01-20 NOTE — Therapy (Signed)
[address] [place], [place], [address] Phone: [phone]   Fax:  [phone]  Physical Therapy Treatment  Patient Details  Name: Kristen Hill MRN: [id number] Date of Birth: December 21, 1925 Referring Provider (PT): Dorina Hoyer Date: 01/20/2021   PT End of Session - 01/20/21 1354    Visit Number 33    Number of Visits 36    Date for PT Re-Evaluation 01/24/21    Authorization Type Hum[employer]    PT Start Time 1300    PT Stop Time 1345    PT Time Calculation (min) 45 min    Activity Tolerance Patient tolerated treatment well    Behavior During Therapy [name] for tasks assessed/performed           Past Medical History:  Diagnosis Date  . Aortic atherosclerosis ([place])   . Cancer [facility])    right breast  . Diabetes mellitus without complication ([place]) 3716   type 2  . Glaucoma    left eye  . Hematuria 10/31/2013  . History of external beam radiation therapy 07/21/17- 08/11/17   Right Breast 2.66 Gy X 16 fractions  . Hypertension   . Jaundice    in 1947  . Jaundice 1946  . Measles as a child  . Multinodular goiter   . Mumps as a child  . OA (osteoarthritis)    right knee, torn meniscus lateral and medial  . Other and unspecified hyperlipidemia 07/31/2013  . Personal history of radiation therapy   . Physical exam, annual 08/02/2013   Sees Dr Trinna Post at [facility] Dr Farris Has, Retinal specialist Dermatolgy  . Thyroid nodule   . Wears glasses   . Wears partial dentures     Past Surgical History:  Procedure Laterality Date  . BREAST LUMPECTOMY Right 06/14/2017  . BREAST LUMPECTOMY WITH RADIOACTIVE SEED LOCALIZATION Right 06/14/2017   Procedure: [place] BREAST LUMPECTOMY WITH RADIOACTIVE SEED LOCALIZATION ERAS PATHWAY;  Surgeon: Jovita Kussmaul, MD;  Location: [place];  Service: General;  Laterality: Right;  . COLONOSCOPY  09/2015  . EYE SURGERY  2009 and 2010   cataract both eyes  . EYE SURGERY  2012    laser to left eye, chalazion was removed with laser  . KNEE SURGERY Right    medial meniscus and lateral menicus  . MULTIPLE TOOTH EXTRACTIONS    . PARTIAL KNEE ARTHROPLASTY Right 04/08/2018   Procedure: RIGHT UNICOMPARTMENTAL KNEE;  Surgeon: Dorna Leitz, MD;  [facility];  Service: Orthopedics;  Laterality: Right;  . TONSILLECTOMY  87 yrs old    There were no vitals filed for this visit.   Subjective Assessment - 01/20/21 1309    Subjective Doing pretty good.    Currently in Pain? No/denies                             [facility] Adult PT Treatment/Exercise - 01/20/21 0001      Ambulation/Gait   Gait Comments gait around the back building needed 3 rest breaks mostly coming up the hill, c/o fatigue in the legs, still hesitation on curbs, had one instance on a small lip in the side walk where she tripped and needed min A to right      High Level Balance   High Level Balance Activities Negotitating around obstacles;Negotiating over obstacles    High Level Balance Comments 6" toe touches on airex      Knee/Hip  Exercises: Aerobic   Recumbent Bike L 2 6 min    Nustep L 5 6 min      Knee/Hip Exercises: Machines for Strengthening   Cybex Knee Extension 5# 3x10    Cybex Knee Flexion 20# 3x10                    PT Short Term Goals - 10/03/20 1054      PT SHORT TERM GOAL #1   Title independent with intiial HEP    Baseline doing elliptical 20 min at home added LE ex    Status Achieved             PT Long Term Goals - 01/09/21 1532      PT LONG TERM GOAL #1   Title Pt will be I with advanced HEP    Status Achieved      PT LONG TERM GOAL #2   Title Pt will demo Berg Balance Score 45/56 in order to decrease risk of falls    Status Achieved      PT LONG TERM GOAL #3   Title Pt will demo BLE hip flexion/extension strength 4+/5    Status Partially Met      PT LONG TERM GOAL #4   Title Pt will report able to rise from recliner with minimal  difficulty    Status Achieved                 Plan - 01/20/21 1354    Clinical Impression Statement Patient doing well, we walked th farthest that she has gone today, this caused her to be fatigued and short of breath with c/o fatigue in the legs, the walking up the hill really was difficult, had an instance on a small change in the side walk where she tripped and needed min A to right    PT Next Visit Plan work on her plan after we d/c    Consulted and Agree with Plan of Care Patient           Patient will benefit from skilled therapeutic intervention in order to improve the following deficits and impairments:  Abnormal gait,Difficulty walking,Decreased endurance,Decreased activity tolerance,Decreased balance,Decreased mobility,Decreased strength,Postural dysfunction  Visit Diagnosis: Difficulty in walking, not elsewhere classified  Abnormality of gait due to impairment of balance  Muscle weakness (generalized)     Problem List Patient Active Problem List   Diagnosis Date Noted  . Primary osteoarthritis of right knee 04/08/2018  . Malignant neoplasm of upper-outer quadrant of right breast in female, estrogen receptor positive ([place]) 06/29/2017  . Unspecified vitamin D deficiency 11/27/2013  . Hematuria 10/31/2013  . Physical exam, annual 08/02/2013  . Other and unspecified hyperlipidemia 07/31/2013  . Diabetes mellitus without complication ([place])   . Hypertension   . Arthritis     Sumner Boast., PT 01/20/2021, 1:56 PM  [place]. [place], [place], [address] Phone: [phone]   Fax:  [phone]  Name: Kristen Hill MRN: [id number] Date of Birth: 02-14-1926

## 2021-01-23 ENCOUNTER — Encounter: Payer: Self-pay | Admitting: Physical Therapy

## 2021-01-23 ENCOUNTER — Other Ambulatory Visit: Payer: Self-pay

## 2021-01-23 ENCOUNTER — Ambulatory Visit: Payer: Medicare PPO | Admitting: Physical Therapy

## 2021-01-23 DIAGNOSIS — R2689 Other abnormalities of gait and mobility: Secondary | ICD-10-CM | POA: Diagnosis not present

## 2021-01-23 DIAGNOSIS — R262 Difficulty in walking, not elsewhere classified: Secondary | ICD-10-CM | POA: Diagnosis not present

## 2021-01-23 DIAGNOSIS — M6281 Muscle weakness (generalized): Secondary | ICD-10-CM | POA: Diagnosis not present

## 2021-01-23 NOTE — Therapy (Signed)
[place]. [place], [place], [address] Phone: [phone]   Fax:  [phone]  Physical Therapy Treatment  Patient Details  Name: Kristen Hill MRN: [id number] Date of Birth: [birth year] Referring Provider (PT): Dorina Hoyer Date: 01/23/2021   PT End of Session - 01/23/21 1355    Visit Number 34    Authorization Type Humana    PT Start Time 5465    PT Stop Time 0354    PT Time Calculation (min) 47 min    Activity Tolerance Patient tolerated treatment well    Behavior During Therapy [facility] for tasks assessed/performed           Past Medical History:  Diagnosis Date  . Aortic atherosclerosis ([place])   . Cancer [facility])    right breast  . Diabetes mellitus without complication ([place]) 6568   type 2  . Glaucoma    left eye  . Hematuria 10/31/2013  . History of external beam radiation therapy 07/21/17- 08/11/17   Right Breast 2.66 Gy X 16 fractions  . Hypertension   . Jaundice    in 1947  . Jaundice 1946  . Measles as a child  . Multinodular goiter   . Mumps as a child  . OA (osteoarthritis)    right knee, torn meniscus lateral and medial  . Other and unspecified hyperlipidemia 07/31/2013  . Personal history of radiation therapy   . Physical exam, annual 08/02/2013   Sees Dr Trinna Post at [facility] Dr Farris Has, Retinal specialist Dermatolgy  . Thyroid nodule   . Wears glasses   . Wears partial dentures     Past Surgical History:  Procedure Laterality Date  . BREAST LUMPECTOMY Right 06/14/2017  . BREAST LUMPECTOMY WITH RADIOACTIVE SEED LOCALIZATION Right 06/14/2017   Procedure: [place] BREAST LUMPECTOMY WITH RADIOACTIVE SEED LOCALIZATION ERAS PATHWAY;  Surgeon: Jovita Kussmaul, MD;  Location: [place];  Service: General;  Laterality: Right;  . COLONOSCOPY  09/2015  . EYE SURGERY  2009 and 2010   cataract both eyes  . EYE SURGERY  2012   laser to left eye, chalazion was removed with laser  .  KNEE SURGERY Right    medial meniscus and lateral menicus  . MULTIPLE TOOTH EXTRACTIONS    . PARTIAL KNEE ARTHROPLASTY Right 04/08/2018   Procedure: RIGHT UNICOMPARTMENTAL KNEE;  Surgeon: Dorna Leitz, MD;  [facility];  Service: Orthopedics;  Laterality: Right;  . TONSILLECTOMY  87 yrs old    There were no vitals filed for this visit.   Subjective Assessment - 01/23/21 1309    Subjective I am a little tired, I cut on some of my bushes    Currently in Pain? No/denies                                     PT Education - 01/23/21 1354    Education Details HEP, I went over the balance and safety and walking, what to watch for, she had not been doing the exercises.  We went over the importance but also the importance of rest and to change things up    Person(s) Educated Patient    Methods Explanation;Demonstration;Tactile cues;Verbal cues;Handout    Comprehension Verbalized understanding;Returned demonstration;Verbal cues required;Tactile cues required            PT Short Term Goals - 10/03/20 1054  PT SHORT TERM GOAL #1   Title independent with intiial HEP    Baseline doing elliptical 20 min at home added LE ex    Status Achieved             PT Long Term Goals - 01/23/21 1358      PT LONG TERM GOAL #1   Title Pt will be I with advanced HEP    Status Achieved      PT LONG TERM GOAL #2   Title Pt will demo Berg Balance Score 45/56 in order to decrease risk of falls    Status Achieved      PT LONG TERM GOAL #3   Title Pt will demo BLE hip flexion/extension strength 4+/5    Status Achieved      PT LONG TERM GOAL #4   Title Pt will report able to rise from recliner with minimal difficulty      PT LONG TERM GOAL #5   Title walk without cane for most distances    Status Achieved                 Plan - 01/23/21 1356    Clinical Impression Statement We went over all the HEP, the safety in and out of the home, the use of an  assistive device.  She had forgot the HEP so I gave new handouts and went over all of that.  She reported understanding, we talked about fall risks and safety in the homa nd outside    PT Next Visit Plan d/c goals met    Consulted and Agree with Plan of Care Patient           Patient will benefit from skilled therapeutic intervention in order to improve the following deficits and impairments:  Abnormal gait,Difficulty walking,Decreased endurance,Decreased activity tolerance,Decreased balance,Decreased mobility,Decreased strength,Postural dysfunction  Visit Diagnosis: Difficulty in walking, not elsewhere classified  Abnormality of gait due to impairment of balance  Muscle weakness (generalized)     Problem List Patient Active Problem List   Diagnosis Date Noted  . Primary osteoarthritis of right knee 04/08/2018  . Malignant neoplasm of upper-outer quadrant of right breast in female, estrogen receptor positive ([place]) 06/29/2017  . Unspecified vitamin D deficiency 11/27/2013  . Hematuria 10/31/2013  . Physical exam, annual 08/02/2013  . Other and unspecified hyperlipidemia 07/31/2013  . Diabetes mellitus without complication ([place])   . Hypertension   . Arthritis     Sumner Boast., PT 01/23/2021, 1:58 PM  [place]. [place], [place], [address] Phone: [phone]   Fax:  [phone]  Name: [place] Wheat MRN: [id number] Date of Birth: [birth year]

## 2021-01-27 ENCOUNTER — Encounter: Payer: Medicare PPO | Admitting: Physical Therapy

## 2021-01-29 ENCOUNTER — Encounter: Payer: Medicare PPO | Admitting: Physical Therapy

## 2021-02-04 ENCOUNTER — Encounter: Payer: Medicare PPO | Admitting: Physical Therapy

## 2021-02-06 ENCOUNTER — Encounter: Payer: Medicare PPO | Admitting: Physical Therapy

## 2021-03-24 DIAGNOSIS — M9903 Segmental and somatic dysfunction of lumbar region: Secondary | ICD-10-CM | POA: Diagnosis not present

## 2021-03-24 DIAGNOSIS — M9901 Segmental and somatic dysfunction of cervical region: Secondary | ICD-10-CM | POA: Diagnosis not present

## 2021-03-24 DIAGNOSIS — M542 Cervicalgia: Secondary | ICD-10-CM | POA: Diagnosis not present

## 2021-03-24 DIAGNOSIS — M62830 Muscle spasm of back: Secondary | ICD-10-CM | POA: Diagnosis not present

## 2021-03-24 DIAGNOSIS — M5451 Vertebrogenic low back pain: Secondary | ICD-10-CM | POA: Diagnosis not present

## 2021-03-24 DIAGNOSIS — M9902 Segmental and somatic dysfunction of thoracic region: Secondary | ICD-10-CM | POA: Diagnosis not present

## 2021-03-24 DIAGNOSIS — M546 Pain in thoracic spine: Secondary | ICD-10-CM | POA: Diagnosis not present

## 2021-05-27 DIAGNOSIS — H903 Sensorineural hearing loss, bilateral: Secondary | ICD-10-CM | POA: Diagnosis not present

## 2021-05-27 DIAGNOSIS — Z8781 Personal history of (healed) traumatic fracture: Secondary | ICD-10-CM | POA: Diagnosis not present

## 2021-05-27 DIAGNOSIS — J342 Deviated nasal septum: Secondary | ICD-10-CM | POA: Diagnosis not present

## 2021-05-27 DIAGNOSIS — J300 Vasomotor rhinitis: Secondary | ICD-10-CM | POA: Diagnosis not present

## 2021-06-09 ENCOUNTER — Other Ambulatory Visit: Payer: Self-pay | Admitting: General Surgery

## 2021-06-09 DIAGNOSIS — Z853 Personal history of malignant neoplasm of breast: Secondary | ICD-10-CM

## 2021-06-09 DIAGNOSIS — Z9889 Other specified postprocedural states: Secondary | ICD-10-CM

## 2021-07-17 ENCOUNTER — Ambulatory Visit
Admission: RE | Admit: 2021-07-17 | Discharge: 2021-07-17 | Disposition: A | Discharge status: Home / Self Care | Payer: Medicare PPO | Source: Ambulatory Visit | Attending: General Surgery | Admitting: General Surgery

## 2021-07-17 ENCOUNTER — Other Ambulatory Visit: Payer: Self-pay

## 2021-07-17 DIAGNOSIS — Z853 Personal history of malignant neoplasm of breast: Secondary | ICD-10-CM

## 2021-07-17 DIAGNOSIS — Z9889 Other specified postprocedural states: Secondary | ICD-10-CM

## 2022-06-08 ENCOUNTER — Other Ambulatory Visit: Payer: Self-pay | Admitting: Internal Medicine

## 2022-06-08 DIAGNOSIS — Z1231 Encounter for screening mammogram for malignant neoplasm of breast: Secondary | ICD-10-CM

## 2022-07-21 ENCOUNTER — Ambulatory Visit
Admission: RE | Admit: 2022-07-21 | Discharge: 2022-07-21 | Disposition: A | Discharge status: Home / Self Care | Payer: Medicare PPO | Source: Ambulatory Visit | Attending: Internal Medicine | Admitting: Internal Medicine

## 2022-07-21 DIAGNOSIS — Z1231 Encounter for screening mammogram for malignant neoplasm of breast: Secondary | ICD-10-CM

## 2022-07-22 ENCOUNTER — Other Ambulatory Visit: Payer: Self-pay | Admitting: Internal Medicine

## 2022-07-22 DIAGNOSIS — Z853 Personal history of malignant neoplasm of breast: Secondary | ICD-10-CM

## 2022-07-23 ENCOUNTER — Ambulatory Visit
Admission: RE | Admit: 2022-07-23 | Discharge: 2022-07-23 | Disposition: A | Discharge status: Home / Self Care | Payer: Medicare PPO | Source: Ambulatory Visit | Attending: Internal Medicine | Admitting: Internal Medicine

## 2022-07-23 DIAGNOSIS — Z853 Personal history of malignant neoplasm of breast: Secondary | ICD-10-CM

## 2022-07-24 ENCOUNTER — Other Ambulatory Visit: Payer: Self-pay | Admitting: Internal Medicine

## 2022-07-24 DIAGNOSIS — R921 Mammographic calcification found on diagnostic imaging of breast: Secondary | ICD-10-CM

## 2022-08-06 ENCOUNTER — Ambulatory Visit
Admission: RE | Admit: 2022-08-06 | Discharge: 2022-08-06 | Disposition: A | Discharge status: Home / Self Care | Payer: Medicare PPO | Source: Ambulatory Visit | Attending: Internal Medicine | Admitting: Internal Medicine

## 2022-08-06 DIAGNOSIS — R921 Mammographic calcification found on diagnostic imaging of breast: Secondary | ICD-10-CM

## 2022-08-06 HISTORY — PX: BREAST BIOPSY: SHX20

## 2022-09-24 ENCOUNTER — Ambulatory Visit: Payer: Medicare PPO | Admitting: Family Medicine

## 2022-10-01 ENCOUNTER — Telehealth: Payer: Self-pay

## 2022-10-01 ENCOUNTER — Ambulatory Visit: Payer: Medicare PPO | Admitting: Family Medicine

## 2022-10-01 ENCOUNTER — Encounter: Payer: Self-pay | Admitting: Family Medicine

## 2022-10-01 VITALS — BP 128/84 | HR 71 | Temp 97.5°F | Ht 59.75 in | Wt 140.6 lb

## 2022-10-01 DIAGNOSIS — I10 Essential (primary) hypertension: Secondary | ICD-10-CM

## 2022-10-01 DIAGNOSIS — N1831 Chronic kidney disease, stage 3a: Secondary | ICD-10-CM

## 2022-10-01 DIAGNOSIS — I129 Hypertensive chronic kidney disease with stage 1 through stage 4 chronic kidney disease, or unspecified chronic kidney disease: Secondary | ICD-10-CM

## 2022-10-01 DIAGNOSIS — E119 Type 2 diabetes mellitus without complications: Secondary | ICD-10-CM

## 2022-10-01 DIAGNOSIS — E1122 Type 2 diabetes mellitus with diabetic chronic kidney disease: Secondary | ICD-10-CM | POA: Diagnosis not present

## 2022-10-01 MED ORDER — AMLODIPINE BESYLATE 5 MG PO TABS: 5.0000 mg | ORAL_TABLET | Freq: Every day | ORAL | 1 refills | Status: DC

## 2022-10-01 MED ORDER — METFORMIN HCL 500 MG PO TABS: 500.0000 mg | ORAL_TABLET | Freq: Every day | ORAL | 1 refills | Status: DC

## 2022-10-01 MED ORDER — FERROUS SULFATE 325 (65 FE) MG PO TABS: 325.0000 mg | ORAL_TABLET | ORAL | 0 refills | Status: AC

## 2022-10-01 MED ORDER — LOSARTAN POTASSIUM 25 MG PO TABS: 25.0000 mg | ORAL_TABLET | Freq: Every day | ORAL | 0 refills | Status: AC

## 2022-10-01 NOTE — Patient Instructions (Signed)
Please continue to check blood pressure and blood sugar.  We have refilled amlodipine, losartan, and metformin, and iron.  We can get repeat blood work at next visit.

## 2022-10-01 NOTE — Telephone Encounter (Signed)
Patient called in for a rx for a new b/p cuff/monitor to be sent into her pharmacy. Please advise

## 2022-10-01 NOTE — Progress Notes (Signed)
Assessment/Plan:   Problem List Items Addressed This Visit       Cardiovascular and Mediastinum   Hypertension associated with stage 3a chronic kidney disease due to type 2 diabetes mellitus [facility])    The patient is a 89 year old female with a notable history of well-managed hypertension and type 2 diabetes mellitus without complications. Over the past month, there has been an upward trend in her home blood pressure readings, which have increased from systolic values in the 161W-960A and diastolic values in the 54U-98J to systolic values in the 191Y-782N over diastolic readings in the 56O-13Y following her medication adjustment. The patient denies any associated symptoms such as chest pain, shortness of breath, headache, or blurry vision.  Differential Diagnosis:  Poorly controlled hypertension: This is the most likely diagnosis given the recent rise in blood pressure readings, possibly due to the medication adjustment. White coat hypertension: Considered less likely since the increase was observed in home readings, but it should not be entirely discounted. Secondary hypertension: Less likely given the patient's longstanding history of hypertension and no new symptoms suggestive of a secondary cause. Plan: Considering the patient's increase in blood pressure readings, a careful review of her antihypertensive therapy is warranted. Based on the information provided, the increase in blood pressure could be related to the recent change in her antihypertensive medications - the decrease in amlodipine dosage and the addition of losartan. We could consider the following steps:  Monitor blood pressure: Increase the frequency of home blood pressure monitoring to twice daily for the upcoming week to gather more data on her blood pressure trends. Medication adjustment: If high readings persist (systolic >865HQIO or diastolic >96EXBM), consider reversing the recent changes - either increasing amlodipine back  to its previous dose or up-titrating losartan, following a careful evaluation of potential side-effects and taking the patient's polypharmacy into account. Follow-up visit: Schedule a visit within 1-2 weeks to reassess blood pressure control and discuss the outcomes of any medication adjustments. Lab work: Order a Medical laboratory scientific officer, including kidney function tests, to rule out any potential secondary causes of hypertension and assess her diabetes control.      Relevant Medications   amLODipine (NORVASC) 5 MG tablet   losartan (COZAAR) 25 MG tablet   metFORMIN (GLUCOPHAGE) 500 MG tablet     Endocrine   Type 2 diabetes mellitus with stage 3a chronic kidney disease, without long-term current use of insulin (HCC) - Primary    The patient, a 89 year old female, has reported increased fasting capillary blood glucose levels following a recent reduction in Metformin dosage from twice daily to once daily. This change was made due to concerns regarding her declining renal function, as evidenced by a decrease in her glomerular filtration rate (GFR) over the past year. The patient has noted that her glucose levels, which previously were well-managed, have risen since this medication adjustment. There is a possibility that the increased blood glucose readings are additionally influenced by the patient's dietary intake during the Christmas season.  Plan:  Blood Glucose Monitoring: The patient has been advised to continue to monitor her fasting blood glucose daily at home. This consistent self-monitoring is essential to assess the impact of the reduced Metformin dosage and to distinguish between the effects of medication change and seasonal dietary variations.  Metformin Therapy: The current lower dose of Metformin will be continued at this time. It is important to monitor the patient's renal function (GFR) alongside her diabetes management to ensure safe dosing and minimize potential renal  impact.  Education and Advice: The patient has been reassured regarding her concerns, and the importance of consistent blood glucose monitoring has been emphasized. Dietary counseling has been provided to encourage glycemic control throughout the year, apart from holiday seasons.  Follow-up: A follow-up appointment will be scheduled in a few weeks to assess how the patient's blood glucose levels are trending. If there is evidence of persistently elevated blood glucose readings, reassessment of the diabetes medication regimen will be considered. At the follow-up, the patient's response to the lower dose of Metformin, adherence to monitoring, and dietary habits will be reviewed.  Additional Measures: If the follow-up reveals an unsatisfactory control of blood glucose, additional diabetes management strategies, including potential medication adjustment, further dietary counseling, or introduction of adjunctive therapies, will be discussed and considered.  By adhering to this plan, the aim is to maintain optimal glycemic control within the patient's individual target range while minimizing the risk of potential medication-induced renal impairment. It is essential to balance the patient's diabetes management with her overall health status, including renal function and coexisting medical conditions      Relevant Medications   amLODipine (NORVASC) 5 MG tablet   losartan (COZAAR) 25 MG tablet   metFORMIN (GLUCOPHAGE) 500 MG tablet   The assignment of CPT code [phone] is justifiable based on the high complexity of decision-making for this patient's case. During today's extended consultation, which spanned approximately one hour, we undertook a comprehensive review of the patient's ongoing chronic medical conditions, specifically hypertension and diabetes mellitus. The therapeutic conversation included not only the dissemination of medical advice but also the provision of emotional reassurance and support,  addressing the patient's concerns regarding their conditions. Despite the noted variations in the patient's blood pressure and blood glucose readings, it was determined that maintaining the existing therapeutic regimen was advisable at this juncture.  The necessity for a cautious and observant approach, prior to initiating any further medicinal adjustments, is crucial due to the delicate interplay between the slight alterations in her medications and the resultant changes observed in her health parameters. Clinical judgment dictates that further observation is warranted to ascertain the need for, and potential benefit of, any therapeutic adjustments. The patient expressed appreciation for the reassurance and concern shown towards her health issues and reported feeling adequately heard and supported during the consultation.  This encounter required the provider's expertise to assess complex medical factors, high-risk decision-making regarding potential changes to the medication regimen, and the provision of emotional support - all factors supporting the use of CPT code [phone] for this service.    Subjective:   Chief Complaint: The patient presented for the establishment of care.  History of Present Illness:  The patient is a 89 year old female with a history of multiple health conditions, including Diabetes mellitus, Hypertension, Osteoarthritis, Hyperlipidemia, annual physical examinations, Hematuria, Vitamin D deficiency, and a malignancy of the right breast upper-outer quadrant, estrogen receptor-positive. Additionally, her past medical history includes Aortic atherosclerosis, Glaucoma, and historic instances of Jaundice and childhood illnesses like Measles and Mumps, among other conditions.  Problem 1: The patient reports fluctuations in her home blood pressure readings. At the beginning of December, she had systolic blood pressure in the 120s to 474Q and diastolic measurements in the 70s to 80s.  However, there has been an increase in readings to the 140s to 150s over the 80s to 90s. This change coincided with an alteration in her medication regimen; a decrease in amlodipine dosage and the addition of losartan. The patient denies  any symptoms commonly associated with elevated blood pressure, such as chest pain, shortness of breath, headaches, or blurry vision.  Problem 2: Additionally, the patient has noticed a general increase in her blood sugar levels, with her fasting capillary blood glucose (CBG) numbers rising by 10-20 points. Where previously her fasting CBG was in the 110s to 120s, she now experiences levels in the 130s. The patient does not report any symptoms of hyperglycemia such as polyuria or polydipsia, nor does she experience hypoglycemia.  Review of Systems: The Review of Systems is unremarkable other than the noted changes in blood pressure and blood glucose levels.  Past Surgical History:  Procedure Laterality Date   BREAST BIOPSY Left 08/06/2022   MM LT BREAST BX W LOC DEV 1ST LESION IMAGE BX SPEC STEREO GUIDE 08/06/2022 [facility]AMMOGRAP[facility]   BREAST LUMPECTOMY Right 06/14/2017   BREAST LUMPECTOMY WITH RADIOACTIVE SEED LOCALIZATION Right 06/14/2017   Procedure: [place] BREAST LUMPECTOMY WITH RADIOACTIVE SEED LOCALIZATION ERAS PATHWAY;  Surgeon: Jovita Kussmaul, MD;  Location: [facility];  Service: General;  Laterality: Right;   COLONOSCOPY  09/2015   EYE SURGERY  2009 and 2010   cataract both eyes   EYE SURGERY  2012   laser to left eye, chalazion was removed with laser   KNEE SURGERY Right    medial meniscus and lateral menicus   MULTIPLE TOOTH EXTRACTIONS     PARTIAL KNEE ARTHROPLASTY Right 04/08/2018   Procedure: RIGHT UNICOMPARTMENTAL KNEE;  Surgeon: Dorna Leitz, MD;  Location: [facility];  Service: Orthopedics;  Laterality: Right;   TONSILLECTOMY  89 yrs old    Outpatient Medications Prior to Visit  Medication Sig Dispense Refill   bimatoprost (LUMIGAN) 0.01 % SOLN Place 1  drop into the left eye at bedtime.      Blood Glucose Monitoring Suppl (GLUCOCOM BLOOD GLUCOSE MONITOR) DEVI 1 Device by Misc.(Non-Drug; Combo Route) route in the morning and at bedtime.     EPINEPHrine 0.3 mg/0.3 mL IJ SOAJ injection Inject 0.3 mg into the skin once.      ipratropium (ATROVENT) 0.06 % nasal spray Place into the nose.     MICROLET LANCETS MISC Check BS q am and prn 100 each 5   Multiple Vitamin (MULTIVITAMIN WITH MINERALS) TABS tablet Take 1 tablet by mouth daily.      timolol (TIMOPTIC) 0.5 % ophthalmic solution      VITAMIN D, ERGOCALCIFEROL, PO Take 1,000 Units by mouth daily.      amLODipine (NORVASC) 5 MG tablet Take 1 tablet (5 mg total) by mouth daily. 90 tablet 1   ferrous sulfate 325 (65 FE) MG tablet Take 325 mg by mouth every other day.     losartan (COZAAR) 25 MG tablet Take by mouth.     metFORMIN (GLUCOPHAGE) 500 MG tablet Take 1 tablet (500 mg total) by mouth 2 (two) times daily with a meal. 180 tablet 1   docusate sodium (COLACE) 100 MG capsule Take 1 capsule (100 mg total) by mouth 2 (two) times daily. (Patient not taking: Reported on 10/01/2022) 30 capsule 0   glucose blood (BAYER CONTOUR TEST) test strip Check blood sugars q am and prn (Patient not taking: Reported on 03/25/2018) 100 each 5   anastrozole (ARIMIDEX) 1 MG tablet Take 1 tablet (1 mg total) daily by mouth. (Patient not taking: Reported on 09/14/2017) 90 tablet 3   aspirin EC 325 MG tablet Take 1 tablet (325 mg total) by mouth 2 (two) times daily after a meal. Take  x 1 month post op to decrease risk of blood clots. (Patient not taking: Reported on 10/01/2022) 60 tablet 0   HYDROcodone-acetaminophen (NORCO) 5-325 MG tablet Take 1-2 tablets by mouth every 6 (six) hours as needed for moderate pain. (Patient not taking: Reported on 10/01/2022) 40 tablet 0   tiZANidine (ZANAFLEX) 2 MG tablet Take 1 tablet (2 mg total) by mouth every 8 (eight) hours as needed for muscle spasms. (Patient not taking: Reported on  10/01/2022) 30 tablet 0   No facility-administered medications prior to visit.    Family History  Problem Relation Age of Onset   Cancer Mother        ovarian   Breast cancer Mother 48   Diabetes Father    Kidney disease Father     Social History   Socioeconomic History   Marital status: Widowed    Spouse name: Not on file   Number of children: Not on file   Years of education: Not on file   Highest education level: Not on file  Occupational History   Not on file  Tobacco Use   Smoking status: Former    Packs/day: 0.10    Years: 1.00    Total pack years: 0.10    Types: Cigarettes    Quit date: 09/28/1950    Years since quitting: 72.0   Smokeless tobacco: Never  Vaping Use   Vaping Use: Never used  Substance and Sexual Activity   Alcohol use: No   Drug use: No   Sexual activity: Never    Birth control/protection: Post-menopausal    Comment: lives with daughter, no dietary restrictions  Other Topics Concern   Not on file  Social History Narrative   Not on file   Social Determinants of Health   Financial Resource Strain: Not on file  Food Insecurity: Not on file  Transportation Needs: Not on file  Physical Activity: Not on file  Stress: Not on file  Social Connections: Not on file  Intimate Partner Violence: Not on file                                                                                                 Objective:  Physical Exam: BP 128/84 (BP Location: Left Arm, Patient Position: Sitting, Cuff Size: Large)   Pulse 71   Temp (!) 97.5 F (36.4 C) (Temporal)   Ht 4' 11.75" (1.518 m)   Wt 140 lb 9.6 oz (63.8 kg)   SpO2 98%   BMI 27.69 kg/m    General: No acute distress. Awake and conversant.  Eyes: Normal conjunctiva, anicteric. Round symmetric pupils.  ENT: Hearing grossly intact. No nasal discharge.  Neck: Neck is supple. No masses or thyromegaly.  Respiratory: Respirations are non-labored. No auditory wheezing.  Skin: Warm. No rashes or  ulcers.  Psych: Alert and oriented. Cooperative, Appropriate mood and affect, Normal judgment.  CV: No cyanosis or JVD MSK: Normal ambulation. No clubbing  Neuro: Sensation and CN II-XII grossly normal.        Alesia Banda, MD, MS

## 2022-10-02 ENCOUNTER — Encounter: Payer: Self-pay | Admitting: Family Medicine

## 2022-10-02 NOTE — Telephone Encounter (Signed)
The pharmacy stated yesterday that they don't take verbal orders for blood pressure cuff/monitor. They requested a rx to be sent over.

## 2022-10-05 NOTE — Progress Notes (Incomplete)
Assessment/Plan:   Problem List Items Addressed This Visit       Cardiovascular and Mediastinum   Hypertension associated with stage 3 chronic kidney disease due to type 2 diabetes mellitus ([place]) - Primary    The patient is a 89 year old female with a notable history of well-managed hypertension and type 2 diabetes mellitus without complications. Over the past month, there has been an upward trend in her home blood pressure readings, which have increased from systolic values in the 106Y-694W and diastolic values in the 54O-27O to systolic values in the 350K-938H over diastolic readings in the 82X-93Z following her medication adjustment. The patient denies any associated symptoms such as chest pain, shortness of breath, headache, or blurry vision.  Differential Diagnosis:  Poorly controlled hypertension: This is the most likely diagnosis given the recent rise in blood pressure readings, possibly due to the medication adjustment. White coat hypertension: Considered less likely since the increase was observed in home readings, but it should not be entirely discounted. Secondary hypertension: Less likely given the patient's longstanding history of hypertension and no new symptoms suggestive of a secondary cause. Plan: Considering the patient's increase in blood pressure readings, a careful review of her antihypertensive therapy is warranted. Based on the information provided, the increase in blood pressure could be related to the recent change in her antihypertensive medications - the decrease in amlodipine dosage and the addition of losartan. We could consider the following steps:  Monitor blood pressure: Increase the frequency of home blood pressure monitoring to twice daily for the upcoming week to gather more data on her blood pressure trends. Medication adjustment: If high readings persist (systolic >169CVEL or diastolic >38BOFB), consider reversing the recent changes - either increasing  amlodipine back to its previous dose or up-titrating losartan, following a careful evaluation of potential side-effects and taking the patient's polypharmacy into account. Follow-up visit: Schedule a visit within 1-2 weeks to reassess blood pressure control and discuss the outcomes of any medication adjustments. Lab work: Order a Medical laboratory scientific officer, including kidney function tests, to rule out any potential secondary causes of hypertension and assess her diabetes control.      Relevant Medications   amLODipine (NORVASC) 5 MG tablet   losartan (COZAAR) 25 MG tablet   metFORMIN (GLUCOPHAGE) 500 MG tablet   Other Visit Diagnoses     HTN (hypertension)       Relevant Medications   amLODipine (NORVASC) 5 MG tablet   losartan (COZAAR) 25 MG tablet   Diabetes (HCC)       Relevant Medications   losartan (COZAAR) 25 MG tablet   metFORMIN (GLUCOPHAGE) 500 MG tablet          Subjective:   Chief Complaint: The patient presented for the establishment of care.  History of Present Illness:  The patient is a 89 year old female with a history of multiple health conditions, including Diabetes mellitus, Hypertension, Osteoarthritis, Hyperlipidemia, annual physical examinations, Hematuria, Vitamin D deficiency, and a malignancy of the right breast upper-outer quadrant, estrogen receptor-positive. Additionally, her past medical history includes Aortic atherosclerosis, Glaucoma, and historic instances of Jaundice and childhood illnesses like Measles and Mumps, among other conditions.  Problem 1: The patient reports fluctuations in her home blood pressure readings. At the beginning of December, she had systolic blood pressure in the 120s to 510C and diastolic measurements in the 70s to 80s. However, there has been an increase in readings to the 140s to 150s over the 80s to 90s. This change coincided with  an alteration in her medication regimen; a decrease in amlodipine dosage and the addition of  losartan. The patient denies any symptoms commonly associated with elevated blood pressure, such as chest pain, shortness of breath, headaches, or blurry vision.  Problem 2: Additionally, the patient has noticed a general increase in her blood sugar levels, with her fasting capillary blood glucose (CBG) numbers rising by 10-20 points. Where previously her fasting CBG was in the 110s to 120s, she now experiences levels in the 130s. The patient does not report any symptoms of hyperglycemia such as polyuria or polydipsia, nor does she experience hypoglycemia.  Review of Systems: The Review of Systems is unremarkable other than the noted changes in blood pressure and blood glucose levels.  Past Surgical History:  Procedure Laterality Date  . BREAST BIOPSY Left 08/06/2022   MM LT BREAST BX W LOC DEV 1ST LESION IMAGE BX SPEC STEREO GUIDE 08/06/2022 [facility]MO[facility]  . BREAST LUMPECTOMY Right 06/14/2017  . BREAST LUMPECTOMY WITH RADIOACTIVE SEED LOCALIZATION Right 06/14/2017   Procedure: [place] BREAST LUMPECTOMY WITH RADIOACTIVE SEED LOCALIZATION ERAS PATHWAY;  Surgeon: Jovita Kussmaul, MD;  Location: [facility];  Service: General;  Laterality: Right;  . COLONOSCOPY  09/2015  . EYE SURGERY  2009 and 2010   cataract both eyes  . EYE SURGERY  2012   laser to left eye, chalazion was removed with laser  . KNEE SURGERY Right    medial meniscus and lateral menicus  . MULTIPLE TOOTH EXTRACTIONS    . PARTIAL KNEE ARTHROPLASTY Right 04/08/2018   Procedure: RIGHT UNICOMPARTMENTAL KNEE;  Surgeon: Dorna Leitz, MD;  Location: [facility];  Service: Orthopedics;  Laterality: Right;  . TONSILLECTOMY  89 yrs old    Outpatient Medications Prior to Visit  Medication Sig Dispense Refill  . bimatoprost (LUMIGAN) 0.01 % SOLN Place 1 drop into the left eye at bedtime.     . Blood Glucose Monitoring Suppl (GLUCOCOM BLOOD GLUCOSE MONITOR) DEVI 1 Device by Misc.(Non-Drug; Combo Route) route in the morning and at bedtime.    Marland Kitchen  EPINEPHrine 0.3 mg/0.3 mL IJ SOAJ injection Inject 0.3 mg into the skin once.     Marland Kitchen ipratropium (ATROVENT) 0.06 % nasal spray Place into the nose.    Marland Kitchen MICROLET LANCETS MISC Check BS q am and prn 100 each 5  . Multiple Vitamin (MULTIVITAMIN WITH MINERALS) TABS tablet Take 1 tablet by mouth daily.     . timolol (TIMOPTIC) 0.5 % ophthalmic solution     . VITAMIN D, ERGOCALCIFEROL, PO Take 1,000 Units by mouth daily.     Marland Kitchen amLODipine (NORVASC) 5 MG tablet Take 1 tablet (5 mg total) by mouth daily. 90 tablet 1  . ferrous sulfate 325 (65 FE) MG tablet Take 325 mg by mouth every other day.    . losartan (COZAAR) 25 MG tablet Take by mouth.    . metFORMIN (GLUCOPHAGE) 500 MG tablet Take 1 tablet (500 mg total) by mouth 2 (two) times daily with a meal. 180 tablet 1  . docusate sodium (COLACE) 100 MG capsule Take 1 capsule (100 mg total) by mouth 2 (two) times daily. (Patient not taking: Reported on 10/01/2022) 30 capsule 0  . glucose blood (BAYER CONTOUR TEST) test strip Check blood sugars q am and prn (Patient not taking: Reported on 03/25/2018) 100 each 5  . anastrozole (ARIMIDEX) 1 MG tablet Take 1 tablet (1 mg total) daily by mouth. (Patient not taking: Reported on 09/14/2017) 90 tablet 3  . aspirin  EC 325 MG tablet Take 1 tablet (325 mg total) by mouth 2 (two) times daily after a meal. Take x 1 month post op to decrease risk of blood clots. (Patient not taking: Reported on 10/01/2022) 60 tablet 0  . HYDROcodone-acetaminophen (NORCO) 5-325 MG tablet Take 1-2 tablets by mouth every 6 (six) hours as needed for moderate pain. (Patient not taking: Reported on 10/01/2022) 40 tablet 0  . tiZANidine (ZANAFLEX) 2 MG tablet Take 1 tablet (2 mg total) by mouth every 8 (eight) hours as needed for muscle spasms. (Patient not taking: Reported on 10/01/2022) 30 tablet 0   No facility-administered medications prior to visit.    Family History  Problem Relation Age of Onset  . Cancer Mother        ovarian  . Breast  cancer Mother 26  . Diabetes Father   . Kidney disease Father     Social History   Socioeconomic History  . Marital status: Widowed    Spouse name: Not on file  . Number of children: Not on file  . Years of education: Not on file  . Highest education level: Not on file  Occupational History  . Not on file  Tobacco Use  . Smoking status: Former    Packs/day: 0.10    Years: 1.00    Total pack years: 0.10    Types: Cigarettes    Quit date: 09/28/1950    Years since quitting: 72.0  . Smokeless tobacco: Never  Vaping Use  . Vaping Use: Never used  Substance and Sexual Activity  . Alcohol use: No  . Drug use: No  . Sexual activity: Never    Birth control/protection: Post-menopausal    Comment: lives with daughter, no dietary restrictions  Other Topics Concern  . Not on file  Social History Narrative  . Not on file   Social Determinants of Health   Financial Resource Strain: Not on file  Food Insecurity: Not on file  Transportation Needs: Not on file  Physical Activity: Not on file  Stress: Not on file  Social Connections: Not on file  Intimate Partner Violence: Not on file                                                                                                 Objective:  Physical Exam: BP 128/84 (BP Location: Left Arm, Patient Position: Sitting, Cuff Size: Large)   Pulse 71   Temp (!) 97.5 F (36.4 C) (Temporal)   Ht 4' 11.75" (1.518 m)   Wt 140 lb 9.6 oz (63.8 kg)   SpO2 98%   BMI 27.69 kg/m    General: No acute distress. Awake and conversant.  Eyes: Normal conjunctiva, anicteric. Round symmetric pupils.  ENT: Hearing grossly intact. No nasal discharge.  Neck: Neck is supple. No masses or thyromegaly.  Respiratory: Respirations are non-labored. No auditory wheezing.  Skin: Warm. No rashes or ulcers.  Psych: Alert and oriented. Cooperative, Appropriate mood and affect, Normal judgment.  CV: No cyanosis or JVD MSK: Normal ambulation. No clubbing   Neuro: Sensation and CN II-XII grossly normal.  Alesia Banda, MD, MS

## 2022-10-05 NOTE — Assessment & Plan Note (Signed)
The patient is a 89 year old female with a notable history of well-managed hypertension and type 2 diabetes mellitus without complications. Over the past month, there has been an upward trend in her home blood pressure readings, which have increased from systolic values in the 326Z-124P and diastolic values in the 80D-98P to systolic values in the 382N-053Z over diastolic readings in the 76B-34L following her medication adjustment. The patient denies any associated symptoms such as chest pain, shortness of breath, headache, or blurry vision.  Differential Diagnosis:  Poorly controlled hypertension: This is the most likely diagnosis given the recent rise in blood pressure readings, possibly due to the medication adjustment. White coat hypertension: Considered less likely since the increase was observed in home readings, but it should not be entirely discounted. Secondary hypertension: Less likely given the patient's longstanding history of hypertension and no new symptoms suggestive of a secondary cause. Plan: Considering the patient's increase in blood pressure readings, a careful review of her antihypertensive therapy is warranted. Based on the information provided, the increase in blood pressure could be related to the recent change in her antihypertensive medications - the decrease in amlodipine dosage and the addition of losartan. We could consider the following steps:  Monitor blood pressure: Increase the frequency of home blood pressure monitoring to twice daily for the upcoming week to gather more data on her blood pressure trends. Medication adjustment: If high readings persist (systolic >937TKWI or diastolic >09BDZH), consider reversing the recent changes - either increasing amlodipine back to its previous dose or up-titrating losartan, following a careful evaluation of potential side-effects and taking the patient's polypharmacy into account. Follow-up visit: Schedule a visit within 1-2 weeks to  reassess blood pressure control and discuss the outcomes of any medication adjustments. Lab work: Order a Medical laboratory scientific officer, including kidney function tests, to rule out any potential secondary causes of hypertension and assess her diabetes control.

## 2022-10-06 ENCOUNTER — Other Ambulatory Visit: Payer: Self-pay | Admitting: Family Medicine

## 2022-10-06 DIAGNOSIS — N1831 Chronic kidney disease, stage 3a: Secondary | ICD-10-CM

## 2022-10-06 DIAGNOSIS — N183 Chronic kidney disease, stage 3 unspecified: Secondary | ICD-10-CM

## 2022-10-06 DIAGNOSIS — E1122 Type 2 diabetes mellitus with diabetic chronic kidney disease: Secondary | ICD-10-CM

## 2022-10-06 MED ORDER — BLOOD PRESSURE CUFF MISC: 1.0000 | 0 refills | Status: AC | PRN

## 2022-10-06 MED ORDER — BLOOD PRESSURE CUFF MISC: 1.0000 | 0 refills | Status: DC | PRN

## 2022-10-06 MED ORDER — ACCU-CHEK SOFTCLIX LANCETS MISC: 12 refills | Status: DC

## 2022-10-06 MED ORDER — METFORMIN HCL 500 MG PO TABS: 500.0000 mg | ORAL_TABLET | Freq: Every day | ORAL | 1 refills | Status: AC

## 2022-10-06 MED ORDER — AMLODIPINE BESYLATE 5 MG PO TABS: 5.0000 mg | ORAL_TABLET | Freq: Every day | ORAL | 1 refills | Status: AC

## 2022-10-06 NOTE — Assessment & Plan Note (Signed)
The patient, a 89 year old female, has reported increased fasting capillary blood glucose levels following a recent reduction in Metformin dosage from twice daily to once daily. This change was made due to concerns regarding her declining renal function, as evidenced by a decrease in her glomerular filtration rate (GFR) over the past year. The patient has noted that her glucose levels, which previously were well-managed, have risen since this medication adjustment. There is a possibility that the increased blood glucose readings are additionally influenced by the patient's dietary intake during the Christmas season.  Plan:  Blood Glucose Monitoring: The patient has been advised to continue to monitor her fasting blood glucose daily at home. This consistent self-monitoring is essential to assess the impact of the reduced Metformin dosage and to distinguish between the effects of medication change and seasonal dietary variations.  Metformin Therapy: The current lower dose of Metformin will be continued at this time. It is important to monitor the patient's renal function (GFR) alongside her diabetes management to ensure safe dosing and minimize potential renal impact.  Education and Advice: The patient has been reassured regarding her concerns, and the importance of consistent blood glucose monitoring has been emphasized. Dietary counseling has been provided to encourage glycemic control throughout the year, apart from holiday seasons.  Follow-up: A follow-up appointment will be scheduled in a few weeks to assess how the patient's blood glucose levels are trending. If there is evidence of persistently elevated blood glucose readings, reassessment of the diabetes medication regimen will be considered. At the follow-up, the patient's response to the lower dose of Metformin, adherence to monitoring, and dietary habits will be reviewed.  Additional Measures: If the follow-up reveals an unsatisfactory control  of blood glucose, additional diabetes management strategies, including potential medication adjustment, further dietary counseling, or introduction of adjunctive therapies, will be discussed and considered.  By adhering to this plan, the aim is to maintain optimal glycemic control within the patient's individual target range while minimizing the risk of potential medication-induced renal impairment. It is essential to balance the patient's diabetes management with her overall health status, including renal function and coexisting medical conditions

## 2022-10-06 NOTE — Assessment & Plan Note (Signed)
The patient is a 89 year old female with a notable history of well-managed hypertension and type 2 diabetes mellitus without complications. Over the past month, there has been an upward trend in her home blood pressure readings, which have increased from systolic values in the 329J-188C and diastolic values in the 16S-06T to systolic values in the 016W-109N over diastolic readings in the 23F-57D following her medication adjustment. The patient denies any associated symptoms such as chest pain, shortness of breath, headache, or blurry vision.  Differential Diagnosis:  Poorly controlled hypertension: This is the most likely diagnosis given the recent rise in blood pressure readings, possibly due to the medication adjustment. White coat hypertension: Considered less likely since the increase was observed in home readings, but it should not be entirely discounted. Secondary hypertension: Less likely given the patient's longstanding history of hypertension and no new symptoms suggestive of a secondary cause. Plan: Considering the patient's increase in blood pressure readings, a careful review of her antihypertensive therapy is warranted. Based on the information provided, the increase in blood pressure could be related to the recent change in her antihypertensive medications - the decrease in amlodipine dosage and the addition of losartan. We could consider the following steps:  Monitor blood pressure: Increase the frequency of home blood pressure monitoring to twice daily for the upcoming week to gather more data on her blood pressure trends. Medication adjustment: If high readings persist (systolic >220URKY or diastolic >70WCBJ), consider reversing the recent changes - either increasing amlodipine back to its previous dose or up-titrating losartan, following a careful evaluation of potential side-effects and taking the patient's polypharmacy into account. Follow-up visit: Schedule a visit within 1-2 weeks to  reassess blood pressure control and discuss the outcomes of any medication adjustments. Lab work: Order a Medical laboratory scientific officer, including kidney function tests, to rule out any potential secondary causes of hypertension and assess her diabetes control.

## 2022-10-07 ENCOUNTER — Other Ambulatory Visit: Payer: Self-pay | Admitting: Family Medicine

## 2022-10-07 DIAGNOSIS — E1122 Type 2 diabetes mellitus with diabetic chronic kidney disease: Secondary | ICD-10-CM

## 2022-10-09 ENCOUNTER — Telehealth: Payer: Self-pay | Admitting: Internal Medicine

## 2022-10-09 DIAGNOSIS — E1122 Type 2 diabetes mellitus with diabetic chronic kidney disease: Secondary | ICD-10-CM

## 2022-10-09 NOTE — Telephone Encounter (Signed)
[employer] rep called and stated that the pt need prescription for glucose meter accu tech guide

## 2022-10-12 MED ORDER — ACCU-CHEK GUIDE ME W/DEVICE KIT: 1.0000 | PACK | 0 refills | Status: AC | PRN

## 2022-10-12 NOTE — Addendum Note (Signed)
Addended by: Josephine Igo B on: 10/12/2022 01:20 AM   Modules accepted: Orders

## 2022-10-15 ENCOUNTER — Ambulatory Visit: Payer: Medicare PPO | Admitting: Family Medicine

## 2023-02-05 ENCOUNTER — Other Ambulatory Visit: Payer: Self-pay | Admitting: General Surgery

## 2023-02-05 DIAGNOSIS — N6092 Unspecified benign mammary dysplasia of left breast: Secondary | ICD-10-CM

## 2023-03-09 ENCOUNTER — Ambulatory Visit
Admission: RE | Admit: 2023-03-09 | Discharge: 2023-03-09 | Disposition: A | Discharge status: Home / Self Care | Payer: Medicare PPO | Source: Ambulatory Visit | Attending: General Surgery | Admitting: General Surgery

## 2023-03-09 DIAGNOSIS — N6092 Unspecified benign mammary dysplasia of left breast: Secondary | ICD-10-CM

## 2023-03-10 ENCOUNTER — Other Ambulatory Visit: Payer: Self-pay | Admitting: General Surgery

## 2023-03-10 DIAGNOSIS — N6091 Unspecified benign mammary dysplasia of right breast: Secondary | ICD-10-CM

## 2023-09-14 ENCOUNTER — Ambulatory Visit
Admission: RE | Admit: 2023-09-14 | Discharge: 2023-09-14 | Disposition: A | Discharge status: Home / Self Care | Payer: Medicare PPO | Source: Ambulatory Visit | Attending: General Surgery | Admitting: General Surgery

## 2023-09-14 ENCOUNTER — Other Ambulatory Visit: Payer: Self-pay | Admitting: General Surgery

## 2023-09-14 DIAGNOSIS — N6091 Unspecified benign mammary dysplasia of right breast: Secondary | ICD-10-CM

## 2023-09-14 DIAGNOSIS — N6489 Other specified disorders of breast: Secondary | ICD-10-CM

## 2023-11-27 ENCOUNTER — Other Ambulatory Visit: Payer: Self-pay | Admitting: Family Medicine

## 2023-11-27 DIAGNOSIS — N1831 Type 2 diabetes mellitus with diabetic chronic kidney disease: Secondary | ICD-10-CM

## 2024-03-16 ENCOUNTER — Other Ambulatory Visit: Payer: Self-pay | Admitting: General Surgery

## 2024-03-16 ENCOUNTER — Ambulatory Visit
Admission: RE | Admit: 2024-03-16 | Discharge: 2024-03-16 | Disposition: A | Discharge status: Home / Self Care | Source: Ambulatory Visit | Attending: General Surgery | Admitting: General Surgery

## 2024-03-16 DIAGNOSIS — N6489 Other specified disorders of breast: Secondary | ICD-10-CM

## 2024-06-05 ENCOUNTER — Emergency Department (HOSPITAL_COMMUNITY)
Admission: EM | Admit: 2024-06-05 | Discharge: 2024-06-05 | Discharge status: Against Medical Advice | Attending: Emergency Medicine | Admitting: Emergency Medicine

## 2024-06-05 ENCOUNTER — Encounter (HOSPITAL_COMMUNITY): Payer: Self-pay | Admitting: Emergency Medicine

## 2024-06-05 ENCOUNTER — Other Ambulatory Visit: Payer: Self-pay

## 2024-06-05 DIAGNOSIS — Z5321 Procedure and treatment not carried out due to patient leaving prior to being seen by health care provider: Secondary | ICD-10-CM | POA: Insufficient documentation

## 2024-06-05 DIAGNOSIS — R531 Weakness: Secondary | ICD-10-CM | POA: Insufficient documentation

## 2024-06-05 DIAGNOSIS — I10 Essential (primary) hypertension: Secondary | ICD-10-CM | POA: Insufficient documentation

## 2024-06-05 LAB — CBC
HCT: 43.8 % (ref 36.0–46.0)
Hemoglobin: 13.5 g/dL (ref 12.0–15.0)
MCH: 29.7 pg (ref 26.0–34.0)
MCHC: 30.8 g/dL (ref 30.0–36.0)
MCV: 96.5 fL (ref 80.0–100.0)
Platelets: 162 K/uL (ref 150–400)
RBC: 4.54 MIL/uL (ref 3.87–5.11)
RDW: 15.9 % — ABNORMAL HIGH (ref 11.5–15.5)
WBC: 5.2 K/uL (ref 4.0–10.5)
nRBC: 0 % (ref 0.0–0.2)

## 2024-06-05 LAB — COMPREHENSIVE METABOLIC PANEL WITH GFR
ALT: 10 U/L (ref 0–44)
AST: 25 U/L (ref 15–41)
Albumin: 4 g/dL (ref 3.5–5.0)
Alkaline Phosphatase: 88 U/L (ref 38–126)
Anion gap: 15 (ref 5–15)
BUN: 19 mg/dL (ref 8–23)
CO2: 18 mmol/L — ABNORMAL LOW (ref 22–32)
Calcium: 9.9 mg/dL (ref 8.9–10.3)
Chloride: 98 mmol/L (ref 98–111)
Creatinine, Ser: 0.94 mg/dL (ref 0.44–1.00)
GFR, Estimated: 55 mL/min — ABNORMAL LOW (ref >60)
Glucose, Bld: 114 mg/dL — ABNORMAL HIGH (ref 70–99)
Potassium: 4.6 mmol/L (ref 3.5–5.1)
Sodium: 131 mmol/L — ABNORMAL LOW (ref 135–145)
Total Bilirubin: 0.8 mg/dL (ref 0.0–1.2)
Total Protein: 8 g/dL (ref 6.5–8.1)

## 2024-06-05 LAB — URINALYSIS, ROUTINE W REFLEX MICROSCOPIC
Bilirubin Urine: NEGATIVE
Glucose, UA: NEGATIVE mg/dL
Hgb urine dipstick: NEGATIVE
Ketones, ur: NEGATIVE mg/dL
Leukocytes,Ua: NEGATIVE
Nitrite: NEGATIVE
Protein, ur: NEGATIVE mg/dL
Specific Gravity, Urine: 1.002 — ABNORMAL LOW (ref 1.005–1.030)
pH: 7 (ref 5.0–8.0)

## 2024-06-05 LAB — RESP PANEL BY RT-PCR (RSV, FLU A&B, COVID)  RVPGX2
Influenza A by PCR: NEGATIVE
Influenza B by PCR: NEGATIVE
Resp Syncytial Virus by PCR: NEGATIVE
SARS Coronavirus 2 by RT PCR: NEGATIVE

## 2024-06-05 LAB — CBG MONITORING, ED: Glucose-Capillary: 123 mg/dL — ABNORMAL HIGH (ref 70–99)

## 2024-06-05 NOTE — ED Triage Notes (Signed)
 Patient c/o weakness x 1 day. Patient report worsening weakness and elevated BP tonight 180/80. PCP recommended to be seen for further workup. Patient denies N/V/D. Patient denies fever at home.

## 2024-06-05 NOTE — ED Notes (Signed)
 Pt and pt family approached staff questioning wait time. Pt advised that we were unable to provide an approximate time. Pt and family requested IV be removed so they could leave. IV removed per pt request. Pt left ED.

## 2024-09-14 ENCOUNTER — Encounter

## 2024-09-19 ENCOUNTER — Ambulatory Visit
Admission: RE | Admit: 2024-09-19 | Discharge: 2024-09-19 | Disposition: A | Discharge status: Home / Self Care | Source: Ambulatory Visit | Attending: General Surgery | Admitting: General Surgery

## 2024-09-19 ENCOUNTER — Encounter

## 2024-09-19 DIAGNOSIS — N6489 Other specified disorders of breast: Secondary | ICD-10-CM
